# Patient Record
Sex: Male | Born: 1945
Health system: Southern US, Community
[De-identification: ages and names within clinical notes are randomized; demographics above are authoritative.]

## PROBLEM LIST (undated history)

## (undated) DIAGNOSIS — G2581 Restless legs syndrome: Secondary | ICD-10-CM

## (undated) DIAGNOSIS — E039 Hypothyroidism, unspecified: Secondary | ICD-10-CM

## (undated) DIAGNOSIS — D473 Essential (hemorrhagic) thrombocythemia: Secondary | ICD-10-CM

## (undated) DIAGNOSIS — I8290 Acute embolism and thrombosis of unspecified vein: Secondary | ICD-10-CM

## (undated) DIAGNOSIS — I4891 Unspecified atrial fibrillation: Secondary | ICD-10-CM

## (undated) DIAGNOSIS — E785 Hyperlipidemia, unspecified: Secondary | ICD-10-CM

## (undated) DIAGNOSIS — N529 Male erectile dysfunction, unspecified: Secondary | ICD-10-CM

## (undated) DIAGNOSIS — D751 Secondary polycythemia: Secondary | ICD-10-CM

## (undated) DIAGNOSIS — I499 Cardiac arrhythmia, unspecified: Secondary | ICD-10-CM

## (undated) DIAGNOSIS — G6289 Other specified polyneuropathies: Secondary | ICD-10-CM

## (undated) DIAGNOSIS — N400 Enlarged prostate without lower urinary tract symptoms: Secondary | ICD-10-CM

## (undated) DIAGNOSIS — Z950 Presence of cardiac pacemaker: Secondary | ICD-10-CM

## (undated) HISTORY — PX: TONSILLECTOMY: SUR1361

## (undated) HISTORY — DX: Secondary polycythemia: D75.1

## (undated) HISTORY — DX: Benign prostatic hyperplasia without lower urinary tract symptoms: N40.0

## (undated) HISTORY — DX: Other specified polyneuropathies: G62.89

## (undated) HISTORY — DX: Essential (hemorrhagic) thrombocythemia: D47.3

## (undated) HISTORY — PX: UPPER GASTROINTESTINAL ENDOSCOPY: SHX188

## (undated) HISTORY — DX: Restless legs syndrome: G25.81

## (undated) HISTORY — DX: Acute embolism and thrombosis of unspecified vein: I82.90

## (undated) HISTORY — DX: Male erectile dysfunction, unspecified: N52.9

## (undated) HISTORY — PX: OTHER SURGICAL HISTORY: SHX169

## (undated) HISTORY — DX: Hyperlipidemia, unspecified: E78.5

## (undated) HISTORY — DX: Hypothyroidism, unspecified: E03.9

---

## 1996-07-24 HISTORY — PX: INGUINAL HERNIA REPAIR: SUR1180

## 2003-12-22 ENCOUNTER — Ambulatory Visit (HOSPITAL_COMMUNITY): Admission: RE | Admit: 2003-12-22 | Discharge: 2003-12-22 | Payer: Self-pay | Admitting: Internal Medicine

## 2007-07-25 HISTORY — PX: COLONOSCOPY: SHX174

## 2011-09-04 DIAGNOSIS — E782 Mixed hyperlipidemia: Secondary | ICD-10-CM | POA: Diagnosis not present

## 2011-09-04 DIAGNOSIS — D649 Anemia, unspecified: Secondary | ICD-10-CM | POA: Diagnosis not present

## 2011-09-04 DIAGNOSIS — N400 Enlarged prostate without lower urinary tract symptoms: Secondary | ICD-10-CM | POA: Diagnosis not present

## 2011-09-11 DIAGNOSIS — L570 Actinic keratosis: Secondary | ICD-10-CM | POA: Diagnosis not present

## 2011-09-11 DIAGNOSIS — Z86010 Personal history of colonic polyps: Secondary | ICD-10-CM | POA: Diagnosis not present

## 2011-09-11 DIAGNOSIS — N529 Male erectile dysfunction, unspecified: Secondary | ICD-10-CM | POA: Diagnosis not present

## 2011-09-11 DIAGNOSIS — N400 Enlarged prostate without lower urinary tract symptoms: Secondary | ICD-10-CM | POA: Diagnosis not present

## 2011-09-11 DIAGNOSIS — E782 Mixed hyperlipidemia: Secondary | ICD-10-CM | POA: Diagnosis not present

## 2011-09-11 DIAGNOSIS — M1990 Unspecified osteoarthritis, unspecified site: Secondary | ICD-10-CM | POA: Diagnosis not present

## 2012-03-07 DIAGNOSIS — D649 Anemia, unspecified: Secondary | ICD-10-CM | POA: Diagnosis not present

## 2012-03-07 DIAGNOSIS — E782 Mixed hyperlipidemia: Secondary | ICD-10-CM | POA: Diagnosis not present

## 2012-03-14 DIAGNOSIS — L570 Actinic keratosis: Secondary | ICD-10-CM | POA: Diagnosis not present

## 2012-03-14 DIAGNOSIS — G619 Inflammatory polyneuropathy, unspecified: Secondary | ICD-10-CM | POA: Diagnosis not present

## 2012-03-14 DIAGNOSIS — Z86010 Personal history of colonic polyps: Secondary | ICD-10-CM | POA: Diagnosis not present

## 2012-03-14 DIAGNOSIS — M1990 Unspecified osteoarthritis, unspecified site: Secondary | ICD-10-CM | POA: Diagnosis not present

## 2012-03-14 DIAGNOSIS — E782 Mixed hyperlipidemia: Secondary | ICD-10-CM | POA: Diagnosis not present

## 2012-03-14 DIAGNOSIS — G622 Polyneuropathy due to other toxic agents: Secondary | ICD-10-CM | POA: Diagnosis not present

## 2012-03-14 DIAGNOSIS — N529 Male erectile dysfunction, unspecified: Secondary | ICD-10-CM | POA: Diagnosis not present

## 2012-03-14 DIAGNOSIS — N400 Enlarged prostate without lower urinary tract symptoms: Secondary | ICD-10-CM | POA: Diagnosis not present

## 2012-03-14 DIAGNOSIS — D649 Anemia, unspecified: Secondary | ICD-10-CM | POA: Diagnosis not present

## 2012-03-14 DIAGNOSIS — E538 Deficiency of other specified B group vitamins: Secondary | ICD-10-CM | POA: Diagnosis not present

## 2012-04-03 DIAGNOSIS — M109 Gout, unspecified: Secondary | ICD-10-CM | POA: Diagnosis not present

## 2012-04-03 DIAGNOSIS — M79609 Pain in unspecified limb: Secondary | ICD-10-CM | POA: Diagnosis not present

## 2012-04-15 DIAGNOSIS — I824Y9 Acute embolism and thrombosis of unspecified deep veins of unspecified proximal lower extremity: Secondary | ICD-10-CM | POA: Diagnosis not present

## 2012-04-15 DIAGNOSIS — I8000 Phlebitis and thrombophlebitis of superficial vessels of unspecified lower extremity: Secondary | ICD-10-CM | POA: Diagnosis not present

## 2012-04-15 DIAGNOSIS — I82819 Embolism and thrombosis of superficial veins of unspecified lower extremities: Secondary | ICD-10-CM | POA: Diagnosis not present

## 2012-04-15 DIAGNOSIS — Z23 Encounter for immunization: Secondary | ICD-10-CM | POA: Diagnosis not present

## 2012-04-15 DIAGNOSIS — M79609 Pain in unspecified limb: Secondary | ICD-10-CM | POA: Diagnosis not present

## 2012-04-15 DIAGNOSIS — M109 Gout, unspecified: Secondary | ICD-10-CM | POA: Diagnosis not present

## 2012-04-15 DIAGNOSIS — I82409 Acute embolism and thrombosis of unspecified deep veins of unspecified lower extremity: Secondary | ICD-10-CM | POA: Diagnosis not present

## 2012-04-16 DIAGNOSIS — I82409 Acute embolism and thrombosis of unspecified deep veins of unspecified lower extremity: Secondary | ICD-10-CM | POA: Diagnosis not present

## 2012-05-28 DIAGNOSIS — G619 Inflammatory polyneuropathy, unspecified: Secondary | ICD-10-CM | POA: Diagnosis not present

## 2012-05-28 DIAGNOSIS — M1990 Unspecified osteoarthritis, unspecified site: Secondary | ICD-10-CM | POA: Diagnosis not present

## 2012-05-28 DIAGNOSIS — N400 Enlarged prostate without lower urinary tract symptoms: Secondary | ICD-10-CM | POA: Diagnosis not present

## 2012-05-28 DIAGNOSIS — N410 Acute prostatitis: Secondary | ICD-10-CM | POA: Diagnosis not present

## 2012-05-28 DIAGNOSIS — E782 Mixed hyperlipidemia: Secondary | ICD-10-CM | POA: Diagnosis not present

## 2012-09-23 DIAGNOSIS — D649 Anemia, unspecified: Secondary | ICD-10-CM | POA: Diagnosis not present

## 2012-09-23 DIAGNOSIS — N400 Enlarged prostate without lower urinary tract symptoms: Secondary | ICD-10-CM | POA: Diagnosis not present

## 2012-09-26 DIAGNOSIS — I739 Peripheral vascular disease, unspecified: Secondary | ICD-10-CM | POA: Diagnosis not present

## 2012-09-26 DIAGNOSIS — M1990 Unspecified osteoarthritis, unspecified site: Secondary | ICD-10-CM | POA: Diagnosis not present

## 2012-09-26 DIAGNOSIS — Z86010 Personal history of colonic polyps: Secondary | ICD-10-CM | POA: Diagnosis not present

## 2012-10-04 DIAGNOSIS — M79609 Pain in unspecified limb: Secondary | ICD-10-CM | POA: Diagnosis not present

## 2012-10-16 DIAGNOSIS — H2510 Age-related nuclear cataract, unspecified eye: Secondary | ICD-10-CM | POA: Diagnosis not present

## 2013-01-21 DIAGNOSIS — E782 Mixed hyperlipidemia: Secondary | ICD-10-CM | POA: Diagnosis not present

## 2013-01-21 DIAGNOSIS — I739 Peripheral vascular disease, unspecified: Secondary | ICD-10-CM | POA: Diagnosis not present

## 2013-01-21 DIAGNOSIS — R5381 Other malaise: Secondary | ICD-10-CM | POA: Diagnosis not present

## 2013-01-28 DIAGNOSIS — G2581 Restless legs syndrome: Secondary | ICD-10-CM | POA: Diagnosis not present

## 2013-01-28 DIAGNOSIS — L570 Actinic keratosis: Secondary | ICD-10-CM | POA: Diagnosis not present

## 2013-06-09 DIAGNOSIS — Z23 Encounter for immunization: Secondary | ICD-10-CM | POA: Diagnosis not present

## 2013-09-02 DIAGNOSIS — K649 Unspecified hemorrhoids: Secondary | ICD-10-CM | POA: Diagnosis not present

## 2013-10-27 DIAGNOSIS — L723 Sebaceous cyst: Secondary | ICD-10-CM | POA: Diagnosis not present

## 2013-10-30 DIAGNOSIS — D485 Neoplasm of uncertain behavior of skin: Secondary | ICD-10-CM | POA: Diagnosis not present

## 2013-10-30 DIAGNOSIS — L723 Sebaceous cyst: Secondary | ICD-10-CM | POA: Diagnosis not present

## 2013-12-18 DIAGNOSIS — Z86010 Personal history of colonic polyps: Secondary | ICD-10-CM | POA: Diagnosis not present

## 2013-12-25 DIAGNOSIS — L570 Actinic keratosis: Secondary | ICD-10-CM | POA: Diagnosis not present

## 2013-12-25 DIAGNOSIS — D751 Secondary polycythemia: Secondary | ICD-10-CM | POA: Diagnosis not present

## 2013-12-25 DIAGNOSIS — Z0000 Encounter for general adult medical examination without abnormal findings: Secondary | ICD-10-CM | POA: Diagnosis not present

## 2013-12-25 DIAGNOSIS — G2581 Restless legs syndrome: Secondary | ICD-10-CM | POA: Diagnosis not present

## 2014-01-19 DIAGNOSIS — M2010 Hallux valgus (acquired), unspecified foot: Secondary | ICD-10-CM | POA: Diagnosis not present

## 2014-01-19 DIAGNOSIS — M779 Enthesopathy, unspecified: Secondary | ICD-10-CM | POA: Diagnosis not present

## 2014-01-19 DIAGNOSIS — M79609 Pain in unspecified limb: Secondary | ICD-10-CM | POA: Diagnosis not present

## 2014-03-04 DIAGNOSIS — M779 Enthesopathy, unspecified: Secondary | ICD-10-CM | POA: Diagnosis not present

## 2014-03-04 DIAGNOSIS — M79609 Pain in unspecified limb: Secondary | ICD-10-CM | POA: Diagnosis not present

## 2014-05-21 DIAGNOSIS — Z23 Encounter for immunization: Secondary | ICD-10-CM | POA: Diagnosis not present

## 2014-06-16 DIAGNOSIS — R5382 Chronic fatigue, unspecified: Secondary | ICD-10-CM | POA: Diagnosis not present

## 2014-06-16 DIAGNOSIS — D751 Secondary polycythemia: Secondary | ICD-10-CM | POA: Diagnosis not present

## 2014-06-16 DIAGNOSIS — E782 Mixed hyperlipidemia: Secondary | ICD-10-CM | POA: Diagnosis not present

## 2014-06-16 DIAGNOSIS — M1990 Unspecified osteoarthritis, unspecified site: Secondary | ICD-10-CM | POA: Diagnosis not present

## 2014-06-23 DIAGNOSIS — E782 Mixed hyperlipidemia: Secondary | ICD-10-CM | POA: Diagnosis not present

## 2014-06-23 DIAGNOSIS — Z1389 Encounter for screening for other disorder: Secondary | ICD-10-CM | POA: Diagnosis not present

## 2014-06-23 DIAGNOSIS — I739 Peripheral vascular disease, unspecified: Secondary | ICD-10-CM | POA: Diagnosis not present

## 2014-06-23 DIAGNOSIS — G6289 Other specified polyneuropathies: Secondary | ICD-10-CM | POA: Diagnosis not present

## 2014-06-23 DIAGNOSIS — D751 Secondary polycythemia: Secondary | ICD-10-CM | POA: Diagnosis not present

## 2014-06-23 DIAGNOSIS — F5221 Male erectile disorder: Secondary | ICD-10-CM | POA: Diagnosis not present

## 2014-06-23 DIAGNOSIS — N400 Enlarged prostate without lower urinary tract symptoms: Secondary | ICD-10-CM | POA: Diagnosis not present

## 2014-06-23 DIAGNOSIS — G2581 Restless legs syndrome: Secondary | ICD-10-CM | POA: Diagnosis not present

## 2014-07-23 DIAGNOSIS — M722 Plantar fascial fibromatosis: Secondary | ICD-10-CM | POA: Diagnosis not present

## 2014-07-23 DIAGNOSIS — M79672 Pain in left foot: Secondary | ICD-10-CM | POA: Diagnosis not present

## 2014-07-24 HISTORY — PX: TRANSURETHRAL RESECTION OF PROSTATE: SHX73

## 2014-07-29 DIAGNOSIS — R072 Precordial pain: Secondary | ICD-10-CM | POA: Diagnosis not present

## 2014-07-29 DIAGNOSIS — N400 Enlarged prostate without lower urinary tract symptoms: Secondary | ICD-10-CM | POA: Diagnosis not present

## 2014-08-03 DIAGNOSIS — R079 Chest pain, unspecified: Secondary | ICD-10-CM | POA: Diagnosis not present

## 2014-08-03 DIAGNOSIS — R9439 Abnormal result of other cardiovascular function study: Secondary | ICD-10-CM | POA: Diagnosis not present

## 2014-08-07 DIAGNOSIS — R079 Chest pain, unspecified: Secondary | ICD-10-CM | POA: Diagnosis not present

## 2014-08-07 DIAGNOSIS — R9439 Abnormal result of other cardiovascular function study: Secondary | ICD-10-CM | POA: Diagnosis not present

## 2014-08-07 DIAGNOSIS — I209 Angina pectoris, unspecified: Secondary | ICD-10-CM | POA: Diagnosis not present

## 2014-08-24 DIAGNOSIS — R351 Nocturia: Secondary | ICD-10-CM | POA: Diagnosis not present

## 2014-08-24 DIAGNOSIS — R3915 Urgency of urination: Secondary | ICD-10-CM | POA: Diagnosis not present

## 2014-08-24 DIAGNOSIS — R3911 Hesitancy of micturition: Secondary | ICD-10-CM | POA: Diagnosis not present

## 2014-09-22 DIAGNOSIS — R3911 Hesitancy of micturition: Secondary | ICD-10-CM | POA: Diagnosis not present

## 2014-09-22 DIAGNOSIS — R3915 Urgency of urination: Secondary | ICD-10-CM | POA: Diagnosis not present

## 2014-09-22 DIAGNOSIS — R351 Nocturia: Secondary | ICD-10-CM | POA: Diagnosis not present

## 2014-09-29 DIAGNOSIS — D45 Polycythemia vera: Secondary | ICD-10-CM | POA: Diagnosis not present

## 2014-09-29 DIAGNOSIS — Z87891 Personal history of nicotine dependence: Secondary | ICD-10-CM | POA: Diagnosis not present

## 2014-09-29 DIAGNOSIS — Z86010 Personal history of colonic polyps: Secondary | ICD-10-CM | POA: Diagnosis not present

## 2014-09-29 DIAGNOSIS — T18120A Food in esophagus causing compression of trachea, initial encounter: Secondary | ICD-10-CM | POA: Diagnosis not present

## 2014-09-29 DIAGNOSIS — R070 Pain in throat: Secondary | ICD-10-CM | POA: Diagnosis not present

## 2014-09-29 DIAGNOSIS — R9431 Abnormal electrocardiogram [ECG] [EKG]: Secondary | ICD-10-CM | POA: Diagnosis not present

## 2014-09-29 DIAGNOSIS — K222 Esophageal obstruction: Secondary | ICD-10-CM | POA: Diagnosis not present

## 2014-09-29 DIAGNOSIS — Z7982 Long term (current) use of aspirin: Secondary | ICD-10-CM | POA: Diagnosis not present

## 2014-09-29 DIAGNOSIS — N400 Enlarged prostate without lower urinary tract symptoms: Secondary | ICD-10-CM | POA: Diagnosis not present

## 2014-09-29 DIAGNOSIS — T18128A Food in esophagus causing other injury, initial encounter: Secondary | ICD-10-CM | POA: Diagnosis not present

## 2014-09-29 DIAGNOSIS — T18108A Unspecified foreign body in esophagus causing other injury, initial encounter: Secondary | ICD-10-CM | POA: Diagnosis not present

## 2014-09-29 DIAGNOSIS — K219 Gastro-esophageal reflux disease without esophagitis: Secondary | ICD-10-CM | POA: Diagnosis not present

## 2014-09-29 DIAGNOSIS — R1310 Dysphagia, unspecified: Secondary | ICD-10-CM | POA: Diagnosis not present

## 2014-09-29 DIAGNOSIS — K449 Diaphragmatic hernia without obstruction or gangrene: Secondary | ICD-10-CM | POA: Diagnosis not present

## 2014-09-29 DIAGNOSIS — Z79899 Other long term (current) drug therapy: Secondary | ICD-10-CM | POA: Diagnosis not present

## 2014-10-01 DIAGNOSIS — R3911 Hesitancy of micturition: Secondary | ICD-10-CM | POA: Diagnosis not present

## 2014-10-01 DIAGNOSIS — R351 Nocturia: Secondary | ICD-10-CM | POA: Diagnosis not present

## 2014-10-01 DIAGNOSIS — N401 Enlarged prostate with lower urinary tract symptoms: Secondary | ICD-10-CM | POA: Diagnosis not present

## 2014-10-01 DIAGNOSIS — Z86010 Personal history of colonic polyps: Secondary | ICD-10-CM | POA: Diagnosis not present

## 2014-10-01 DIAGNOSIS — Z87891 Personal history of nicotine dependence: Secondary | ICD-10-CM | POA: Diagnosis not present

## 2014-10-01 DIAGNOSIS — K219 Gastro-esophageal reflux disease without esophagitis: Secondary | ICD-10-CM | POA: Diagnosis not present

## 2014-10-01 DIAGNOSIS — Z79899 Other long term (current) drug therapy: Secondary | ICD-10-CM | POA: Diagnosis not present

## 2014-10-01 DIAGNOSIS — R3915 Urgency of urination: Secondary | ICD-10-CM | POA: Diagnosis not present

## 2014-10-02 DIAGNOSIS — N400 Enlarged prostate without lower urinary tract symptoms: Secondary | ICD-10-CM | POA: Diagnosis not present

## 2014-10-26 DIAGNOSIS — Z86010 Personal history of colonic polyps: Secondary | ICD-10-CM | POA: Diagnosis not present

## 2014-10-26 DIAGNOSIS — K222 Esophageal obstruction: Secondary | ICD-10-CM | POA: Diagnosis not present

## 2015-01-12 DIAGNOSIS — E782 Mixed hyperlipidemia: Secondary | ICD-10-CM | POA: Diagnosis not present

## 2015-01-12 DIAGNOSIS — D751 Secondary polycythemia: Secondary | ICD-10-CM | POA: Diagnosis not present

## 2015-01-12 DIAGNOSIS — R5382 Chronic fatigue, unspecified: Secondary | ICD-10-CM | POA: Diagnosis not present

## 2015-01-12 DIAGNOSIS — D473 Essential (hemorrhagic) thrombocythemia: Secondary | ICD-10-CM | POA: Diagnosis not present

## 2015-01-13 DIAGNOSIS — E782 Mixed hyperlipidemia: Secondary | ICD-10-CM | POA: Diagnosis not present

## 2015-01-14 DIAGNOSIS — M79671 Pain in right foot: Secondary | ICD-10-CM | POA: Diagnosis not present

## 2015-01-14 DIAGNOSIS — M779 Enthesopathy, unspecified: Secondary | ICD-10-CM | POA: Diagnosis not present

## 2015-01-14 DIAGNOSIS — M2042 Other hammer toe(s) (acquired), left foot: Secondary | ICD-10-CM | POA: Diagnosis not present

## 2015-01-14 DIAGNOSIS — M2011 Hallux valgus (acquired), right foot: Secondary | ICD-10-CM | POA: Diagnosis not present

## 2015-01-20 DIAGNOSIS — F5221 Male erectile disorder: Secondary | ICD-10-CM | POA: Diagnosis not present

## 2015-01-20 DIAGNOSIS — D751 Secondary polycythemia: Secondary | ICD-10-CM | POA: Diagnosis not present

## 2015-01-20 DIAGNOSIS — G2581 Restless legs syndrome: Secondary | ICD-10-CM | POA: Diagnosis not present

## 2015-01-20 DIAGNOSIS — N400 Enlarged prostate without lower urinary tract symptoms: Secondary | ICD-10-CM | POA: Diagnosis not present

## 2015-01-20 DIAGNOSIS — Z0001 Encounter for general adult medical examination with abnormal findings: Secondary | ICD-10-CM | POA: Diagnosis not present

## 2015-01-20 DIAGNOSIS — E875 Hyperkalemia: Secondary | ICD-10-CM | POA: Diagnosis not present

## 2015-01-20 DIAGNOSIS — G6289 Other specified polyneuropathies: Secondary | ICD-10-CM | POA: Diagnosis not present

## 2015-01-20 DIAGNOSIS — I739 Peripheral vascular disease, unspecified: Secondary | ICD-10-CM | POA: Diagnosis not present

## 2015-02-04 DIAGNOSIS — M79672 Pain in left foot: Secondary | ICD-10-CM | POA: Diagnosis not present

## 2015-02-04 DIAGNOSIS — M25579 Pain in unspecified ankle and joints of unspecified foot: Secondary | ICD-10-CM | POA: Diagnosis not present

## 2015-02-11 DIAGNOSIS — N3281 Overactive bladder: Secondary | ICD-10-CM | POA: Diagnosis not present

## 2015-02-11 DIAGNOSIS — R351 Nocturia: Secondary | ICD-10-CM | POA: Diagnosis not present

## 2015-02-16 DIAGNOSIS — D473 Essential (hemorrhagic) thrombocythemia: Secondary | ICD-10-CM | POA: Diagnosis not present

## 2015-02-16 DIAGNOSIS — E875 Hyperkalemia: Secondary | ICD-10-CM | POA: Diagnosis not present

## 2015-03-03 DIAGNOSIS — M779 Enthesopathy, unspecified: Secondary | ICD-10-CM | POA: Diagnosis not present

## 2015-03-03 DIAGNOSIS — M2011 Hallux valgus (acquired), right foot: Secondary | ICD-10-CM | POA: Diagnosis not present

## 2015-03-03 DIAGNOSIS — M2041 Other hammer toe(s) (acquired), right foot: Secondary | ICD-10-CM | POA: Diagnosis not present

## 2015-03-03 DIAGNOSIS — M79671 Pain in right foot: Secondary | ICD-10-CM | POA: Diagnosis not present

## 2015-05-06 DIAGNOSIS — M2041 Other hammer toe(s) (acquired), right foot: Secondary | ICD-10-CM | POA: Diagnosis not present

## 2015-05-06 DIAGNOSIS — M79674 Pain in right toe(s): Secondary | ICD-10-CM | POA: Diagnosis not present

## 2015-05-06 DIAGNOSIS — M79671 Pain in right foot: Secondary | ICD-10-CM | POA: Diagnosis not present

## 2015-05-14 DIAGNOSIS — K219 Gastro-esophageal reflux disease without esophagitis: Secondary | ICD-10-CM | POA: Diagnosis not present

## 2015-05-14 DIAGNOSIS — M2041 Other hammer toe(s) (acquired), right foot: Secondary | ICD-10-CM | POA: Diagnosis not present

## 2015-05-14 DIAGNOSIS — M79674 Pain in right toe(s): Secondary | ICD-10-CM | POA: Diagnosis not present

## 2015-05-14 DIAGNOSIS — Z86718 Personal history of other venous thrombosis and embolism: Secondary | ICD-10-CM | POA: Diagnosis not present

## 2015-05-14 DIAGNOSIS — M7751 Other enthesopathy of right foot: Secondary | ICD-10-CM | POA: Diagnosis not present

## 2015-05-14 DIAGNOSIS — M79671 Pain in right foot: Secondary | ICD-10-CM | POA: Diagnosis not present

## 2015-05-14 DIAGNOSIS — M2011 Hallux valgus (acquired), right foot: Secondary | ICD-10-CM | POA: Diagnosis not present

## 2015-05-14 DIAGNOSIS — Z79899 Other long term (current) drug therapy: Secondary | ICD-10-CM | POA: Diagnosis not present

## 2015-06-02 DIAGNOSIS — Z23 Encounter for immunization: Secondary | ICD-10-CM | POA: Diagnosis not present

## 2015-07-08 DIAGNOSIS — L600 Ingrowing nail: Secondary | ICD-10-CM | POA: Diagnosis not present

## 2015-07-08 DIAGNOSIS — L03032 Cellulitis of left toe: Secondary | ICD-10-CM | POA: Diagnosis not present

## 2015-07-08 DIAGNOSIS — M79675 Pain in left toe(s): Secondary | ICD-10-CM | POA: Diagnosis not present

## 2015-07-08 DIAGNOSIS — M79672 Pain in left foot: Secondary | ICD-10-CM | POA: Diagnosis not present

## 2015-07-20 DIAGNOSIS — D473 Essential (hemorrhagic) thrombocythemia: Secondary | ICD-10-CM | POA: Diagnosis not present

## 2015-07-20 DIAGNOSIS — M1990 Unspecified osteoarthritis, unspecified site: Secondary | ICD-10-CM | POA: Diagnosis not present

## 2015-07-20 DIAGNOSIS — E782 Mixed hyperlipidemia: Secondary | ICD-10-CM | POA: Diagnosis not present

## 2015-07-20 DIAGNOSIS — R5382 Chronic fatigue, unspecified: Secondary | ICD-10-CM | POA: Diagnosis not present

## 2015-07-20 DIAGNOSIS — E875 Hyperkalemia: Secondary | ICD-10-CM | POA: Diagnosis not present

## 2015-07-22 DIAGNOSIS — M79672 Pain in left foot: Secondary | ICD-10-CM | POA: Diagnosis not present

## 2015-07-22 DIAGNOSIS — M79675 Pain in left toe(s): Secondary | ICD-10-CM | POA: Diagnosis not present

## 2015-07-22 DIAGNOSIS — L600 Ingrowing nail: Secondary | ICD-10-CM | POA: Diagnosis not present

## 2015-07-27 DIAGNOSIS — G2581 Restless legs syndrome: Secondary | ICD-10-CM | POA: Diagnosis not present

## 2015-07-27 DIAGNOSIS — D751 Secondary polycythemia: Secondary | ICD-10-CM | POA: Diagnosis not present

## 2015-07-27 DIAGNOSIS — E782 Mixed hyperlipidemia: Secondary | ICD-10-CM | POA: Diagnosis not present

## 2015-07-27 DIAGNOSIS — D473 Essential (hemorrhagic) thrombocythemia: Secondary | ICD-10-CM | POA: Diagnosis not present

## 2015-07-27 DIAGNOSIS — F5221 Male erectile disorder: Secondary | ICD-10-CM | POA: Diagnosis not present

## 2015-07-27 DIAGNOSIS — Z1389 Encounter for screening for other disorder: Secondary | ICD-10-CM | POA: Diagnosis not present

## 2015-07-27 DIAGNOSIS — Z9189 Other specified personal risk factors, not elsewhere classified: Secondary | ICD-10-CM | POA: Diagnosis not present

## 2015-07-27 DIAGNOSIS — N400 Enlarged prostate without lower urinary tract symptoms: Secondary | ICD-10-CM | POA: Diagnosis not present

## 2015-07-27 DIAGNOSIS — G6289 Other specified polyneuropathies: Secondary | ICD-10-CM | POA: Diagnosis not present

## 2015-08-12 DIAGNOSIS — L03032 Cellulitis of left toe: Secondary | ICD-10-CM | POA: Diagnosis not present

## 2015-08-12 DIAGNOSIS — L600 Ingrowing nail: Secondary | ICD-10-CM | POA: Diagnosis not present

## 2015-08-12 DIAGNOSIS — M79675 Pain in left toe(s): Secondary | ICD-10-CM | POA: Diagnosis not present

## 2015-09-07 DIAGNOSIS — R5382 Chronic fatigue, unspecified: Secondary | ICD-10-CM | POA: Diagnosis not present

## 2015-09-20 DIAGNOSIS — N3281 Overactive bladder: Secondary | ICD-10-CM | POA: Diagnosis not present

## 2015-09-20 DIAGNOSIS — R3915 Urgency of urination: Secondary | ICD-10-CM | POA: Diagnosis not present

## 2015-10-19 DIAGNOSIS — E039 Hypothyroidism, unspecified: Secondary | ICD-10-CM | POA: Diagnosis not present

## 2015-11-18 DIAGNOSIS — M79671 Pain in right foot: Secondary | ICD-10-CM | POA: Diagnosis not present

## 2015-11-18 DIAGNOSIS — L03031 Cellulitis of right toe: Secondary | ICD-10-CM | POA: Diagnosis not present

## 2015-11-18 DIAGNOSIS — M79674 Pain in right toe(s): Secondary | ICD-10-CM | POA: Diagnosis not present

## 2015-11-30 DIAGNOSIS — D216 Benign neoplasm of connective and other soft tissue of trunk, unspecified: Secondary | ICD-10-CM | POA: Diagnosis not present

## 2015-11-30 DIAGNOSIS — L570 Actinic keratosis: Secondary | ICD-10-CM | POA: Diagnosis not present

## 2015-11-30 DIAGNOSIS — D0439 Carcinoma in situ of skin of other parts of face: Secondary | ICD-10-CM | POA: Diagnosis not present

## 2015-11-30 DIAGNOSIS — D0462 Carcinoma in situ of skin of left upper limb, including shoulder: Secondary | ICD-10-CM | POA: Diagnosis not present

## 2015-11-30 DIAGNOSIS — D044 Carcinoma in situ of skin of scalp and neck: Secondary | ICD-10-CM | POA: Diagnosis not present

## 2015-11-30 DIAGNOSIS — D485 Neoplasm of uncertain behavior of skin: Secondary | ICD-10-CM | POA: Diagnosis not present

## 2015-12-02 DIAGNOSIS — M79674 Pain in right toe(s): Secondary | ICD-10-CM | POA: Diagnosis not present

## 2015-12-02 DIAGNOSIS — L89892 Pressure ulcer of other site, stage 2: Secondary | ICD-10-CM | POA: Diagnosis not present

## 2015-12-02 DIAGNOSIS — M79671 Pain in right foot: Secondary | ICD-10-CM | POA: Diagnosis not present

## 2015-12-16 DIAGNOSIS — L89893 Pressure ulcer of other site, stage 3: Secondary | ICD-10-CM | POA: Diagnosis not present

## 2015-12-29 DIAGNOSIS — L89893 Pressure ulcer of other site, stage 3: Secondary | ICD-10-CM | POA: Diagnosis not present

## 2015-12-30 DIAGNOSIS — C44629 Squamous cell carcinoma of skin of left upper limb, including shoulder: Secondary | ICD-10-CM | POA: Diagnosis not present

## 2015-12-30 DIAGNOSIS — C44321 Squamous cell carcinoma of skin of nose: Secondary | ICD-10-CM | POA: Diagnosis not present

## 2016-01-08 DIAGNOSIS — N400 Enlarged prostate without lower urinary tract symptoms: Secondary | ICD-10-CM | POA: Diagnosis not present

## 2016-01-08 DIAGNOSIS — R0989 Other specified symptoms and signs involving the circulatory and respiratory systems: Secondary | ICD-10-CM | POA: Diagnosis not present

## 2016-01-08 DIAGNOSIS — I509 Heart failure, unspecified: Secondary | ICD-10-CM | POA: Diagnosis not present

## 2016-01-08 DIAGNOSIS — Z7982 Long term (current) use of aspirin: Secondary | ICD-10-CM | POA: Diagnosis not present

## 2016-01-08 DIAGNOSIS — T18128A Food in esophagus causing other injury, initial encounter: Secondary | ICD-10-CM | POA: Diagnosis not present

## 2016-01-08 DIAGNOSIS — Z87891 Personal history of nicotine dependence: Secondary | ICD-10-CM | POA: Diagnosis not present

## 2016-01-08 DIAGNOSIS — J9811 Atelectasis: Secondary | ICD-10-CM | POA: Diagnosis not present

## 2016-01-08 DIAGNOSIS — D72829 Elevated white blood cell count, unspecified: Secondary | ICD-10-CM | POA: Diagnosis not present

## 2016-01-08 DIAGNOSIS — Z79899 Other long term (current) drug therapy: Secondary | ICD-10-CM | POA: Diagnosis not present

## 2016-01-08 DIAGNOSIS — K219 Gastro-esophageal reflux disease without esophagitis: Secondary | ICD-10-CM | POA: Diagnosis not present

## 2016-01-08 DIAGNOSIS — I4891 Unspecified atrial fibrillation: Secondary | ICD-10-CM | POA: Diagnosis not present

## 2016-01-09 DIAGNOSIS — R9431 Abnormal electrocardiogram [ECG] [EKG]: Secondary | ICD-10-CM | POA: Diagnosis not present

## 2016-01-09 DIAGNOSIS — Z6828 Body mass index (BMI) 28.0-28.9, adult: Secondary | ICD-10-CM | POA: Diagnosis not present

## 2016-01-09 DIAGNOSIS — K117 Disturbances of salivary secretion: Secondary | ICD-10-CM | POA: Diagnosis not present

## 2016-01-09 DIAGNOSIS — K3189 Other diseases of stomach and duodenum: Secondary | ICD-10-CM | POA: Diagnosis not present

## 2016-01-09 DIAGNOSIS — R279 Unspecified lack of coordination: Secondary | ICD-10-CM | POA: Diagnosis not present

## 2016-01-09 DIAGNOSIS — K209 Esophagitis, unspecified: Secondary | ICD-10-CM | POA: Diagnosis not present

## 2016-01-09 DIAGNOSIS — I493 Ventricular premature depolarization: Secondary | ICD-10-CM | POA: Diagnosis not present

## 2016-01-09 DIAGNOSIS — Z79899 Other long term (current) drug therapy: Secondary | ICD-10-CM | POA: Diagnosis not present

## 2016-01-09 DIAGNOSIS — Z87891 Personal history of nicotine dependence: Secondary | ICD-10-CM | POA: Diagnosis not present

## 2016-01-09 DIAGNOSIS — R1310 Dysphagia, unspecified: Secondary | ICD-10-CM | POA: Diagnosis not present

## 2016-01-09 DIAGNOSIS — K210 Gastro-esophageal reflux disease with esophagitis: Secondary | ICD-10-CM | POA: Diagnosis not present

## 2016-01-09 DIAGNOSIS — T18128A Food in esophagus causing other injury, initial encounter: Secondary | ICD-10-CM | POA: Diagnosis not present

## 2016-01-09 DIAGNOSIS — I4891 Unspecified atrial fibrillation: Secondary | ICD-10-CM | POA: Diagnosis not present

## 2016-01-09 DIAGNOSIS — K222 Esophageal obstruction: Secondary | ICD-10-CM | POA: Diagnosis not present

## 2016-01-09 DIAGNOSIS — Z743 Need for continuous supervision: Secondary | ICD-10-CM | POA: Diagnosis not present

## 2016-01-10 DIAGNOSIS — R079 Chest pain, unspecified: Secondary | ICD-10-CM | POA: Diagnosis not present

## 2016-01-10 DIAGNOSIS — K210 Gastro-esophageal reflux disease with esophagitis: Secondary | ICD-10-CM | POA: Diagnosis not present

## 2016-01-10 DIAGNOSIS — T18128A Food in esophagus causing other injury, initial encounter: Secondary | ICD-10-CM | POA: Diagnosis not present

## 2016-01-10 DIAGNOSIS — I4891 Unspecified atrial fibrillation: Secondary | ICD-10-CM | POA: Diagnosis not present

## 2016-01-10 DIAGNOSIS — K222 Esophageal obstruction: Secondary | ICD-10-CM | POA: Diagnosis not present

## 2016-01-10 DIAGNOSIS — K117 Disturbances of salivary secretion: Secondary | ICD-10-CM | POA: Diagnosis not present

## 2016-01-10 DIAGNOSIS — K3189 Other diseases of stomach and duodenum: Secondary | ICD-10-CM | POA: Diagnosis not present

## 2016-01-10 DIAGNOSIS — T18128D Food in esophagus causing other injury, subsequent encounter: Secondary | ICD-10-CM | POA: Diagnosis not present

## 2016-01-12 DIAGNOSIS — L89893 Pressure ulcer of other site, stage 3: Secondary | ICD-10-CM | POA: Diagnosis not present

## 2016-01-13 DIAGNOSIS — C4442 Squamous cell carcinoma of skin of scalp and neck: Secondary | ICD-10-CM | POA: Diagnosis not present

## 2016-02-03 DIAGNOSIS — L89892 Pressure ulcer of other site, stage 2: Secondary | ICD-10-CM | POA: Diagnosis not present

## 2016-02-09 DIAGNOSIS — I480 Paroxysmal atrial fibrillation: Secondary | ICD-10-CM | POA: Diagnosis not present

## 2016-03-28 DIAGNOSIS — E875 Hyperkalemia: Secondary | ICD-10-CM | POA: Diagnosis not present

## 2016-03-28 DIAGNOSIS — E039 Hypothyroidism, unspecified: Secondary | ICD-10-CM | POA: Diagnosis not present

## 2016-03-28 DIAGNOSIS — I480 Paroxysmal atrial fibrillation: Secondary | ICD-10-CM | POA: Diagnosis not present

## 2016-03-28 DIAGNOSIS — D473 Essential (hemorrhagic) thrombocythemia: Secondary | ICD-10-CM | POA: Diagnosis not present

## 2016-03-28 DIAGNOSIS — D751 Secondary polycythemia: Secondary | ICD-10-CM | POA: Diagnosis not present

## 2016-03-28 DIAGNOSIS — E782 Mixed hyperlipidemia: Secondary | ICD-10-CM | POA: Diagnosis not present

## 2016-04-05 DIAGNOSIS — D751 Secondary polycythemia: Secondary | ICD-10-CM | POA: Diagnosis not present

## 2016-04-05 DIAGNOSIS — Z6828 Body mass index (BMI) 28.0-28.9, adult: Secondary | ICD-10-CM | POA: Diagnosis not present

## 2016-04-05 DIAGNOSIS — Z0001 Encounter for general adult medical examination with abnormal findings: Secondary | ICD-10-CM | POA: Diagnosis not present

## 2016-04-05 DIAGNOSIS — Z1212 Encounter for screening for malignant neoplasm of rectum: Secondary | ICD-10-CM | POA: Diagnosis not present

## 2016-04-05 DIAGNOSIS — Z23 Encounter for immunization: Secondary | ICD-10-CM | POA: Diagnosis not present

## 2016-04-05 DIAGNOSIS — F5221 Male erectile disorder: Secondary | ICD-10-CM | POA: Diagnosis not present

## 2016-04-05 DIAGNOSIS — D473 Essential (hemorrhagic) thrombocythemia: Secondary | ICD-10-CM | POA: Diagnosis not present

## 2016-04-05 DIAGNOSIS — E782 Mixed hyperlipidemia: Secondary | ICD-10-CM | POA: Diagnosis not present

## 2016-05-17 DIAGNOSIS — E039 Hypothyroidism, unspecified: Secondary | ICD-10-CM | POA: Diagnosis not present

## 2016-05-17 DIAGNOSIS — D751 Secondary polycythemia: Secondary | ICD-10-CM | POA: Diagnosis not present

## 2016-05-17 DIAGNOSIS — E875 Hyperkalemia: Secondary | ICD-10-CM | POA: Diagnosis not present

## 2016-05-17 DIAGNOSIS — E782 Mixed hyperlipidemia: Secondary | ICD-10-CM | POA: Diagnosis not present

## 2016-05-17 DIAGNOSIS — D473 Essential (hemorrhagic) thrombocythemia: Secondary | ICD-10-CM | POA: Diagnosis not present

## 2016-05-31 DIAGNOSIS — L89893 Pressure ulcer of other site, stage 3: Secondary | ICD-10-CM | POA: Diagnosis not present

## 2016-05-31 DIAGNOSIS — L02611 Cutaneous abscess of right foot: Secondary | ICD-10-CM | POA: Diagnosis not present

## 2016-05-31 DIAGNOSIS — M79671 Pain in right foot: Secondary | ICD-10-CM | POA: Diagnosis not present

## 2016-06-02 DIAGNOSIS — T18128A Food in esophagus causing other injury, initial encounter: Secondary | ICD-10-CM | POA: Diagnosis not present

## 2016-06-02 DIAGNOSIS — Z87891 Personal history of nicotine dependence: Secondary | ICD-10-CM | POA: Diagnosis not present

## 2016-06-02 DIAGNOSIS — Z79899 Other long term (current) drug therapy: Secondary | ICD-10-CM | POA: Diagnosis not present

## 2016-06-02 DIAGNOSIS — K449 Diaphragmatic hernia without obstruction or gangrene: Secondary | ICD-10-CM | POA: Diagnosis not present

## 2016-06-02 DIAGNOSIS — K222 Esophageal obstruction: Secondary | ICD-10-CM | POA: Diagnosis not present

## 2016-06-02 DIAGNOSIS — X58XXXA Exposure to other specified factors, initial encounter: Secondary | ICD-10-CM | POA: Diagnosis not present

## 2016-06-02 DIAGNOSIS — K219 Gastro-esophageal reflux disease without esophagitis: Secondary | ICD-10-CM | POA: Diagnosis not present

## 2016-06-02 DIAGNOSIS — T189XXA Foreign body of alimentary tract, part unspecified, initial encounter: Secondary | ICD-10-CM | POA: Diagnosis not present

## 2016-06-02 DIAGNOSIS — Z86010 Personal history of colonic polyps: Secondary | ICD-10-CM | POA: Diagnosis not present

## 2016-06-02 DIAGNOSIS — N400 Enlarged prostate without lower urinary tract symptoms: Secondary | ICD-10-CM | POA: Diagnosis not present

## 2016-06-02 DIAGNOSIS — Z7982 Long term (current) use of aspirin: Secondary | ICD-10-CM | POA: Diagnosis not present

## 2016-06-05 DIAGNOSIS — R1310 Dysphagia, unspecified: Secondary | ICD-10-CM | POA: Diagnosis not present

## 2016-06-05 DIAGNOSIS — K222 Esophageal obstruction: Secondary | ICD-10-CM | POA: Diagnosis not present

## 2016-06-14 DIAGNOSIS — Z7982 Long term (current) use of aspirin: Secondary | ICD-10-CM | POA: Diagnosis not present

## 2016-06-14 DIAGNOSIS — R1310 Dysphagia, unspecified: Secondary | ICD-10-CM | POA: Diagnosis not present

## 2016-06-14 DIAGNOSIS — K219 Gastro-esophageal reflux disease without esophagitis: Secondary | ICD-10-CM | POA: Diagnosis not present

## 2016-06-14 DIAGNOSIS — Z86010 Personal history of colonic polyps: Secondary | ICD-10-CM | POA: Diagnosis not present

## 2016-06-14 DIAGNOSIS — Z79899 Other long term (current) drug therapy: Secondary | ICD-10-CM | POA: Diagnosis not present

## 2016-06-14 DIAGNOSIS — K222 Esophageal obstruction: Secondary | ICD-10-CM | POA: Diagnosis not present

## 2016-06-21 DIAGNOSIS — E1140 Type 2 diabetes mellitus with diabetic neuropathy, unspecified: Secondary | ICD-10-CM | POA: Diagnosis not present

## 2016-06-21 DIAGNOSIS — L89893 Pressure ulcer of other site, stage 3: Secondary | ICD-10-CM | POA: Diagnosis not present

## 2016-06-28 DIAGNOSIS — E039 Hypothyroidism, unspecified: Secondary | ICD-10-CM | POA: Diagnosis not present

## 2016-07-12 DIAGNOSIS — L89893 Pressure ulcer of other site, stage 3: Secondary | ICD-10-CM | POA: Diagnosis not present

## 2016-07-12 DIAGNOSIS — E1140 Type 2 diabetes mellitus with diabetic neuropathy, unspecified: Secondary | ICD-10-CM | POA: Diagnosis not present

## 2016-08-14 DIAGNOSIS — N3281 Overactive bladder: Secondary | ICD-10-CM | POA: Diagnosis not present

## 2016-08-23 DIAGNOSIS — E1140 Type 2 diabetes mellitus with diabetic neuropathy, unspecified: Secondary | ICD-10-CM | POA: Diagnosis not present

## 2016-08-23 DIAGNOSIS — L89893 Pressure ulcer of other site, stage 3: Secondary | ICD-10-CM | POA: Diagnosis not present

## 2016-09-07 DIAGNOSIS — D0439 Carcinoma in situ of skin of other parts of face: Secondary | ICD-10-CM | POA: Diagnosis not present

## 2016-09-07 DIAGNOSIS — L570 Actinic keratosis: Secondary | ICD-10-CM | POA: Diagnosis not present

## 2016-09-07 DIAGNOSIS — X32XXXA Exposure to sunlight, initial encounter: Secondary | ICD-10-CM | POA: Diagnosis not present

## 2016-09-07 DIAGNOSIS — D225 Melanocytic nevi of trunk: Secondary | ICD-10-CM | POA: Diagnosis not present

## 2016-10-02 DIAGNOSIS — Z9189 Other specified personal risk factors, not elsewhere classified: Secondary | ICD-10-CM | POA: Diagnosis not present

## 2016-10-02 DIAGNOSIS — E039 Hypothyroidism, unspecified: Secondary | ICD-10-CM | POA: Diagnosis not present

## 2016-10-02 DIAGNOSIS — E782 Mixed hyperlipidemia: Secondary | ICD-10-CM | POA: Diagnosis not present

## 2016-10-02 DIAGNOSIS — D473 Essential (hemorrhagic) thrombocythemia: Secondary | ICD-10-CM | POA: Diagnosis not present

## 2016-10-02 DIAGNOSIS — D751 Secondary polycythemia: Secondary | ICD-10-CM | POA: Diagnosis not present

## 2016-10-02 DIAGNOSIS — E875 Hyperkalemia: Secondary | ICD-10-CM | POA: Diagnosis not present

## 2016-10-04 DIAGNOSIS — G2581 Restless legs syndrome: Secondary | ICD-10-CM | POA: Diagnosis not present

## 2016-10-04 DIAGNOSIS — D473 Essential (hemorrhagic) thrombocythemia: Secondary | ICD-10-CM | POA: Diagnosis not present

## 2016-10-04 DIAGNOSIS — D751 Secondary polycythemia: Secondary | ICD-10-CM | POA: Diagnosis not present

## 2016-10-04 DIAGNOSIS — E782 Mixed hyperlipidemia: Secondary | ICD-10-CM | POA: Diagnosis not present

## 2016-10-05 DIAGNOSIS — X32XXXD Exposure to sunlight, subsequent encounter: Secondary | ICD-10-CM | POA: Diagnosis not present

## 2016-10-05 DIAGNOSIS — L570 Actinic keratosis: Secondary | ICD-10-CM | POA: Diagnosis not present

## 2016-10-05 DIAGNOSIS — Z85828 Personal history of other malignant neoplasm of skin: Secondary | ICD-10-CM | POA: Diagnosis not present

## 2016-10-05 DIAGNOSIS — Z08 Encounter for follow-up examination after completed treatment for malignant neoplasm: Secondary | ICD-10-CM | POA: Diagnosis not present

## 2017-02-28 ENCOUNTER — Ambulatory Visit (INDEPENDENT_AMBULATORY_CARE_PROVIDER_SITE_OTHER): Payer: Medicare Other | Admitting: Urology

## 2017-02-28 DIAGNOSIS — R351 Nocturia: Secondary | ICD-10-CM

## 2017-02-28 DIAGNOSIS — N401 Enlarged prostate with lower urinary tract symptoms: Secondary | ICD-10-CM

## 2017-04-03 DIAGNOSIS — D473 Essential (hemorrhagic) thrombocythemia: Secondary | ICD-10-CM | POA: Diagnosis not present

## 2017-04-03 DIAGNOSIS — E039 Hypothyroidism, unspecified: Secondary | ICD-10-CM | POA: Diagnosis not present

## 2017-04-03 DIAGNOSIS — N400 Enlarged prostate without lower urinary tract symptoms: Secondary | ICD-10-CM | POA: Diagnosis not present

## 2017-04-03 DIAGNOSIS — E782 Mixed hyperlipidemia: Secondary | ICD-10-CM | POA: Diagnosis not present

## 2017-04-03 DIAGNOSIS — E875 Hyperkalemia: Secondary | ICD-10-CM | POA: Diagnosis not present

## 2017-05-14 DIAGNOSIS — Z1212 Encounter for screening for malignant neoplasm of rectum: Secondary | ICD-10-CM | POA: Diagnosis not present

## 2017-05-14 DIAGNOSIS — E039 Hypothyroidism, unspecified: Secondary | ICD-10-CM | POA: Diagnosis not present

## 2017-05-14 DIAGNOSIS — D751 Secondary polycythemia: Secondary | ICD-10-CM | POA: Diagnosis not present

## 2017-05-14 DIAGNOSIS — Z9189 Other specified personal risk factors, not elsewhere classified: Secondary | ICD-10-CM | POA: Diagnosis not present

## 2017-05-14 DIAGNOSIS — G2581 Restless legs syndrome: Secondary | ICD-10-CM | POA: Diagnosis not present

## 2017-05-14 DIAGNOSIS — E782 Mixed hyperlipidemia: Secondary | ICD-10-CM | POA: Diagnosis not present

## 2017-05-14 DIAGNOSIS — D473 Essential (hemorrhagic) thrombocythemia: Secondary | ICD-10-CM | POA: Diagnosis not present

## 2017-05-14 DIAGNOSIS — Z23 Encounter for immunization: Secondary | ICD-10-CM | POA: Diagnosis not present

## 2017-05-14 DIAGNOSIS — Z0001 Encounter for general adult medical examination with abnormal findings: Secondary | ICD-10-CM | POA: Diagnosis not present

## 2017-05-28 DIAGNOSIS — E039 Hypothyroidism, unspecified: Secondary | ICD-10-CM | POA: Diagnosis not present

## 2017-05-28 DIAGNOSIS — Z87891 Personal history of nicotine dependence: Secondary | ICD-10-CM | POA: Diagnosis not present

## 2017-05-28 DIAGNOSIS — I209 Angina pectoris, unspecified: Secondary | ICD-10-CM | POA: Diagnosis not present

## 2017-05-28 DIAGNOSIS — N400 Enlarged prostate without lower urinary tract symptoms: Secondary | ICD-10-CM | POA: Diagnosis not present

## 2017-05-28 DIAGNOSIS — R9439 Abnormal result of other cardiovascular function study: Secondary | ICD-10-CM | POA: Diagnosis not present

## 2017-05-28 DIAGNOSIS — K222 Esophageal obstruction: Secondary | ICD-10-CM | POA: Diagnosis not present

## 2017-05-28 DIAGNOSIS — Z79899 Other long term (current) drug therapy: Secondary | ICD-10-CM | POA: Diagnosis not present

## 2017-05-28 DIAGNOSIS — R160 Hepatomegaly, not elsewhere classified: Secondary | ICD-10-CM | POA: Diagnosis not present

## 2017-05-28 DIAGNOSIS — C946 Myelodysplastic disease, not classified: Secondary | ICD-10-CM | POA: Diagnosis not present

## 2017-05-28 DIAGNOSIS — E782 Mixed hyperlipidemia: Secondary | ICD-10-CM | POA: Diagnosis not present

## 2017-05-28 DIAGNOSIS — K219 Gastro-esophageal reflux disease without esophagitis: Secondary | ICD-10-CM | POA: Diagnosis not present

## 2017-05-30 ENCOUNTER — Ambulatory Visit: Payer: Medicare Other | Admitting: Urology

## 2017-05-30 DIAGNOSIS — R351 Nocturia: Secondary | ICD-10-CM | POA: Diagnosis not present

## 2017-05-30 DIAGNOSIS — N401 Enlarged prostate with lower urinary tract symptoms: Secondary | ICD-10-CM

## 2017-06-25 DIAGNOSIS — L570 Actinic keratosis: Secondary | ICD-10-CM | POA: Diagnosis not present

## 2017-07-04 DIAGNOSIS — N400 Enlarged prostate without lower urinary tract symptoms: Secondary | ICD-10-CM | POA: Diagnosis not present

## 2017-07-04 DIAGNOSIS — Z87891 Personal history of nicotine dependence: Secondary | ICD-10-CM | POA: Diagnosis not present

## 2017-07-04 DIAGNOSIS — Z7982 Long term (current) use of aspirin: Secondary | ICD-10-CM | POA: Diagnosis not present

## 2017-07-04 DIAGNOSIS — E039 Hypothyroidism, unspecified: Secondary | ICD-10-CM | POA: Diagnosis not present

## 2017-07-04 DIAGNOSIS — K219 Gastro-esophageal reflux disease without esophagitis: Secondary | ICD-10-CM | POA: Diagnosis not present

## 2017-07-04 DIAGNOSIS — Z79899 Other long term (current) drug therapy: Secondary | ICD-10-CM | POA: Diagnosis not present

## 2017-07-04 DIAGNOSIS — R160 Hepatomegaly, not elsewhere classified: Secondary | ICD-10-CM | POA: Diagnosis not present

## 2017-07-04 DIAGNOSIS — E782 Mixed hyperlipidemia: Secondary | ICD-10-CM | POA: Diagnosis not present

## 2017-07-11 ENCOUNTER — Ambulatory Visit: Payer: Medicare Other | Admitting: Urology

## 2017-07-11 DIAGNOSIS — N401 Enlarged prostate with lower urinary tract symptoms: Secondary | ICD-10-CM | POA: Diagnosis not present

## 2017-07-11 DIAGNOSIS — R351 Nocturia: Secondary | ICD-10-CM

## 2017-08-07 DIAGNOSIS — D45 Polycythemia vera: Secondary | ICD-10-CM | POA: Diagnosis not present

## 2017-08-27 DIAGNOSIS — L03031 Cellulitis of right toe: Secondary | ICD-10-CM | POA: Diagnosis not present

## 2017-08-27 DIAGNOSIS — M2031 Hallux varus (acquired), right foot: Secondary | ICD-10-CM | POA: Diagnosis not present

## 2017-09-05 DIAGNOSIS — L89893 Pressure ulcer of other site, stage 3: Secondary | ICD-10-CM | POA: Diagnosis not present

## 2017-09-05 DIAGNOSIS — E1140 Type 2 diabetes mellitus with diabetic neuropathy, unspecified: Secondary | ICD-10-CM | POA: Diagnosis not present

## 2017-09-26 DIAGNOSIS — L89893 Pressure ulcer of other site, stage 3: Secondary | ICD-10-CM | POA: Diagnosis not present

## 2017-09-26 DIAGNOSIS — E1140 Type 2 diabetes mellitus with diabetic neuropathy, unspecified: Secondary | ICD-10-CM | POA: Diagnosis not present

## 2017-10-10 DIAGNOSIS — L89893 Pressure ulcer of other site, stage 3: Secondary | ICD-10-CM | POA: Diagnosis not present

## 2017-10-10 DIAGNOSIS — E1140 Type 2 diabetes mellitus with diabetic neuropathy, unspecified: Secondary | ICD-10-CM | POA: Diagnosis not present

## 2017-10-17 ENCOUNTER — Ambulatory Visit: Payer: Medicare Other | Admitting: Urology

## 2017-10-17 DIAGNOSIS — R351 Nocturia: Secondary | ICD-10-CM

## 2017-10-17 DIAGNOSIS — N401 Enlarged prostate with lower urinary tract symptoms: Secondary | ICD-10-CM | POA: Diagnosis not present

## 2017-11-07 DIAGNOSIS — D751 Secondary polycythemia: Secondary | ICD-10-CM | POA: Diagnosis not present

## 2017-11-07 DIAGNOSIS — E875 Hyperkalemia: Secondary | ICD-10-CM | POA: Diagnosis not present

## 2017-11-07 DIAGNOSIS — Z9189 Other specified personal risk factors, not elsewhere classified: Secondary | ICD-10-CM | POA: Diagnosis not present

## 2017-11-07 DIAGNOSIS — E1140 Type 2 diabetes mellitus with diabetic neuropathy, unspecified: Secondary | ICD-10-CM | POA: Diagnosis not present

## 2017-11-07 DIAGNOSIS — D473 Essential (hemorrhagic) thrombocythemia: Secondary | ICD-10-CM | POA: Diagnosis not present

## 2017-11-07 DIAGNOSIS — L89893 Pressure ulcer of other site, stage 3: Secondary | ICD-10-CM | POA: Diagnosis not present

## 2017-11-07 DIAGNOSIS — G2581 Restless legs syndrome: Secondary | ICD-10-CM | POA: Diagnosis not present

## 2017-11-07 DIAGNOSIS — E782 Mixed hyperlipidemia: Secondary | ICD-10-CM | POA: Diagnosis not present

## 2017-11-16 DIAGNOSIS — D473 Essential (hemorrhagic) thrombocythemia: Secondary | ICD-10-CM | POA: Diagnosis not present

## 2017-11-16 DIAGNOSIS — D751 Secondary polycythemia: Secondary | ICD-10-CM | POA: Diagnosis not present

## 2017-11-16 DIAGNOSIS — E782 Mixed hyperlipidemia: Secondary | ICD-10-CM | POA: Diagnosis not present

## 2017-11-16 DIAGNOSIS — G6289 Other specified polyneuropathies: Secondary | ICD-10-CM | POA: Diagnosis not present

## 2017-12-26 DIAGNOSIS — S51012A Laceration without foreign body of left elbow, initial encounter: Secondary | ICD-10-CM | POA: Diagnosis not present

## 2017-12-26 DIAGNOSIS — W540XXA Bitten by dog, initial encounter: Secondary | ICD-10-CM | POA: Diagnosis not present

## 2017-12-26 DIAGNOSIS — Z23 Encounter for immunization: Secondary | ICD-10-CM | POA: Diagnosis not present

## 2018-01-03 DIAGNOSIS — Z4802 Encounter for removal of sutures: Secondary | ICD-10-CM | POA: Diagnosis not present

## 2018-01-03 DIAGNOSIS — W540XXA Bitten by dog, initial encounter: Secondary | ICD-10-CM | POA: Diagnosis not present

## 2018-01-03 DIAGNOSIS — S51012A Laceration without foreign body of left elbow, initial encounter: Secondary | ICD-10-CM | POA: Diagnosis not present

## 2018-01-16 DIAGNOSIS — E1140 Type 2 diabetes mellitus with diabetic neuropathy, unspecified: Secondary | ICD-10-CM | POA: Diagnosis not present

## 2018-01-16 DIAGNOSIS — L89893 Pressure ulcer of other site, stage 3: Secondary | ICD-10-CM | POA: Diagnosis not present

## 2018-02-06 DIAGNOSIS — E1140 Type 2 diabetes mellitus with diabetic neuropathy, unspecified: Secondary | ICD-10-CM | POA: Diagnosis not present

## 2018-02-06 DIAGNOSIS — L89893 Pressure ulcer of other site, stage 3: Secondary | ICD-10-CM | POA: Diagnosis not present

## 2018-02-28 DIAGNOSIS — C44229 Squamous cell carcinoma of skin of left ear and external auricular canal: Secondary | ICD-10-CM | POA: Diagnosis not present

## 2018-02-28 DIAGNOSIS — L570 Actinic keratosis: Secondary | ICD-10-CM | POA: Diagnosis not present

## 2018-02-28 DIAGNOSIS — X32XXXD Exposure to sunlight, subsequent encounter: Secondary | ICD-10-CM | POA: Diagnosis not present

## 2018-03-01 DIAGNOSIS — E1140 Type 2 diabetes mellitus with diabetic neuropathy, unspecified: Secondary | ICD-10-CM | POA: Diagnosis not present

## 2018-03-01 DIAGNOSIS — L89893 Pressure ulcer of other site, stage 3: Secondary | ICD-10-CM | POA: Diagnosis not present

## 2018-04-04 DIAGNOSIS — Z08 Encounter for follow-up examination after completed treatment for malignant neoplasm: Secondary | ICD-10-CM | POA: Diagnosis not present

## 2018-04-04 DIAGNOSIS — L110 Acquired keratosis follicularis: Secondary | ICD-10-CM | POA: Diagnosis not present

## 2018-04-04 DIAGNOSIS — Z85828 Personal history of other malignant neoplasm of skin: Secondary | ICD-10-CM | POA: Diagnosis not present

## 2018-04-04 DIAGNOSIS — E1151 Type 2 diabetes mellitus with diabetic peripheral angiopathy without gangrene: Secondary | ICD-10-CM | POA: Diagnosis not present

## 2018-04-04 DIAGNOSIS — E1140 Type 2 diabetes mellitus with diabetic neuropathy, unspecified: Secondary | ICD-10-CM | POA: Diagnosis not present

## 2018-05-14 DIAGNOSIS — E782 Mixed hyperlipidemia: Secondary | ICD-10-CM | POA: Diagnosis not present

## 2018-05-14 DIAGNOSIS — I480 Paroxysmal atrial fibrillation: Secondary | ICD-10-CM | POA: Diagnosis not present

## 2018-05-14 DIAGNOSIS — G2581 Restless legs syndrome: Secondary | ICD-10-CM | POA: Diagnosis not present

## 2018-05-14 DIAGNOSIS — D751 Secondary polycythemia: Secondary | ICD-10-CM | POA: Diagnosis not present

## 2018-05-14 DIAGNOSIS — E039 Hypothyroidism, unspecified: Secondary | ICD-10-CM | POA: Diagnosis not present

## 2018-05-16 DIAGNOSIS — Z23 Encounter for immunization: Secondary | ICD-10-CM | POA: Diagnosis not present

## 2018-05-16 DIAGNOSIS — Z1212 Encounter for screening for malignant neoplasm of rectum: Secondary | ICD-10-CM | POA: Diagnosis not present

## 2018-05-16 DIAGNOSIS — Z0001 Encounter for general adult medical examination with abnormal findings: Secondary | ICD-10-CM | POA: Diagnosis not present

## 2018-06-03 DIAGNOSIS — D0461 Carcinoma in situ of skin of right upper limb, including shoulder: Secondary | ICD-10-CM | POA: Diagnosis not present

## 2018-06-03 DIAGNOSIS — L570 Actinic keratosis: Secondary | ICD-10-CM | POA: Diagnosis not present

## 2018-06-03 DIAGNOSIS — X32XXXD Exposure to sunlight, subsequent encounter: Secondary | ICD-10-CM | POA: Diagnosis not present

## 2018-06-03 DIAGNOSIS — Z85828 Personal history of other malignant neoplasm of skin: Secondary | ICD-10-CM | POA: Diagnosis not present

## 2018-06-03 DIAGNOSIS — Z08 Encounter for follow-up examination after completed treatment for malignant neoplasm: Secondary | ICD-10-CM | POA: Diagnosis not present

## 2018-06-13 DIAGNOSIS — E1140 Type 2 diabetes mellitus with diabetic neuropathy, unspecified: Secondary | ICD-10-CM | POA: Diagnosis not present

## 2018-06-13 DIAGNOSIS — E1151 Type 2 diabetes mellitus with diabetic peripheral angiopathy without gangrene: Secondary | ICD-10-CM | POA: Diagnosis not present

## 2018-06-13 DIAGNOSIS — L110 Acquired keratosis follicularis: Secondary | ICD-10-CM | POA: Diagnosis not present

## 2018-06-19 ENCOUNTER — Other Ambulatory Visit (HOSPITAL_COMMUNITY): Payer: Self-pay | Admitting: Emergency Medicine

## 2018-06-19 DIAGNOSIS — Z87891 Personal history of nicotine dependence: Secondary | ICD-10-CM | POA: Diagnosis not present

## 2018-06-19 DIAGNOSIS — K219 Gastro-esophageal reflux disease without esophagitis: Secondary | ICD-10-CM | POA: Diagnosis not present

## 2018-06-19 DIAGNOSIS — Z7982 Long term (current) use of aspirin: Secondary | ICD-10-CM | POA: Diagnosis not present

## 2018-06-19 DIAGNOSIS — M79662 Pain in left lower leg: Secondary | ICD-10-CM

## 2018-06-19 DIAGNOSIS — L03116 Cellulitis of left lower limb: Secondary | ICD-10-CM | POA: Diagnosis not present

## 2018-06-19 DIAGNOSIS — Z79899 Other long term (current) drug therapy: Secondary | ICD-10-CM | POA: Diagnosis not present

## 2018-06-19 DIAGNOSIS — M7989 Other specified soft tissue disorders: Principal | ICD-10-CM

## 2018-06-25 ENCOUNTER — Ambulatory Visit (HOSPITAL_COMMUNITY)
Admission: RE | Admit: 2018-06-25 | Discharge: 2018-06-25 | Disposition: A | Discharge status: Home / Self Care | Payer: Medicare Other | Source: Ambulatory Visit | Attending: Emergency Medicine | Admitting: Emergency Medicine

## 2018-06-25 DIAGNOSIS — M7989 Other specified soft tissue disorders: Secondary | ICD-10-CM | POA: Diagnosis not present

## 2018-06-25 DIAGNOSIS — M79662 Pain in left lower leg: Secondary | ICD-10-CM

## 2018-06-25 DIAGNOSIS — I8002 Phlebitis and thrombophlebitis of superficial vessels of left lower extremity: Secondary | ICD-10-CM | POA: Insufficient documentation

## 2018-07-01 DIAGNOSIS — I8003 Phlebitis and thrombophlebitis of superficial vessels of lower extremities, bilateral: Secondary | ICD-10-CM | POA: Diagnosis not present

## 2018-07-03 DIAGNOSIS — L570 Actinic keratosis: Secondary | ICD-10-CM | POA: Diagnosis not present

## 2018-08-21 DIAGNOSIS — E1151 Type 2 diabetes mellitus with diabetic peripheral angiopathy without gangrene: Secondary | ICD-10-CM | POA: Diagnosis not present

## 2018-08-21 DIAGNOSIS — M79671 Pain in right foot: Secondary | ICD-10-CM | POA: Diagnosis not present

## 2018-08-21 DIAGNOSIS — L110 Acquired keratosis follicularis: Secondary | ICD-10-CM | POA: Diagnosis not present

## 2018-08-21 DIAGNOSIS — E1140 Type 2 diabetes mellitus with diabetic neuropathy, unspecified: Secondary | ICD-10-CM | POA: Diagnosis not present

## 2018-08-22 DIAGNOSIS — I480 Paroxysmal atrial fibrillation: Secondary | ICD-10-CM | POA: Diagnosis not present

## 2018-09-20 DIAGNOSIS — I480 Paroxysmal atrial fibrillation: Secondary | ICD-10-CM | POA: Diagnosis not present

## 2018-09-20 DIAGNOSIS — E782 Mixed hyperlipidemia: Secondary | ICD-10-CM | POA: Diagnosis not present

## 2018-10-30 DIAGNOSIS — L110 Acquired keratosis follicularis: Secondary | ICD-10-CM | POA: Diagnosis not present

## 2018-10-30 DIAGNOSIS — E1151 Type 2 diabetes mellitus with diabetic peripheral angiopathy without gangrene: Secondary | ICD-10-CM | POA: Diagnosis not present

## 2018-10-30 DIAGNOSIS — M79671 Pain in right foot: Secondary | ICD-10-CM | POA: Diagnosis not present

## 2018-10-30 DIAGNOSIS — E1140 Type 2 diabetes mellitus with diabetic neuropathy, unspecified: Secondary | ICD-10-CM | POA: Diagnosis not present

## 2018-11-13 DIAGNOSIS — K219 Gastro-esophageal reflux disease without esophagitis: Secondary | ICD-10-CM | POA: Diagnosis not present

## 2018-11-13 DIAGNOSIS — E039 Hypothyroidism, unspecified: Secondary | ICD-10-CM | POA: Diagnosis not present

## 2018-11-13 DIAGNOSIS — R946 Abnormal results of thyroid function studies: Secondary | ICD-10-CM | POA: Diagnosis not present

## 2018-11-13 DIAGNOSIS — R5382 Chronic fatigue, unspecified: Secondary | ICD-10-CM | POA: Diagnosis not present

## 2018-11-13 DIAGNOSIS — E782 Mixed hyperlipidemia: Secondary | ICD-10-CM | POA: Diagnosis not present

## 2018-11-13 DIAGNOSIS — I480 Paroxysmal atrial fibrillation: Secondary | ICD-10-CM | POA: Diagnosis not present

## 2018-11-18 DIAGNOSIS — D473 Essential (hemorrhagic) thrombocythemia: Secondary | ICD-10-CM | POA: Diagnosis not present

## 2018-11-18 DIAGNOSIS — D751 Secondary polycythemia: Secondary | ICD-10-CM | POA: Diagnosis not present

## 2018-11-18 DIAGNOSIS — G2581 Restless legs syndrome: Secondary | ICD-10-CM | POA: Diagnosis not present

## 2018-11-18 DIAGNOSIS — E782 Mixed hyperlipidemia: Secondary | ICD-10-CM | POA: Diagnosis not present

## 2018-12-21 DIAGNOSIS — I10 Essential (primary) hypertension: Secondary | ICD-10-CM | POA: Diagnosis not present

## 2018-12-21 DIAGNOSIS — E782 Mixed hyperlipidemia: Secondary | ICD-10-CM | POA: Diagnosis not present

## 2019-01-15 DIAGNOSIS — E1140 Type 2 diabetes mellitus with diabetic neuropathy, unspecified: Secondary | ICD-10-CM | POA: Diagnosis not present

## 2019-01-15 DIAGNOSIS — M79671 Pain in right foot: Secondary | ICD-10-CM | POA: Diagnosis not present

## 2019-01-15 DIAGNOSIS — E1151 Type 2 diabetes mellitus with diabetic peripheral angiopathy without gangrene: Secondary | ICD-10-CM | POA: Diagnosis not present

## 2019-01-15 DIAGNOSIS — L110 Acquired keratosis follicularis: Secondary | ICD-10-CM | POA: Diagnosis not present

## 2019-01-21 DIAGNOSIS — E782 Mixed hyperlipidemia: Secondary | ICD-10-CM | POA: Diagnosis not present

## 2019-01-21 DIAGNOSIS — E039 Hypothyroidism, unspecified: Secondary | ICD-10-CM | POA: Diagnosis not present

## 2019-02-21 DIAGNOSIS — I10 Essential (primary) hypertension: Secondary | ICD-10-CM | POA: Diagnosis not present

## 2019-03-24 DIAGNOSIS — D751 Secondary polycythemia: Secondary | ICD-10-CM | POA: Diagnosis not present

## 2019-03-24 DIAGNOSIS — I10 Essential (primary) hypertension: Secondary | ICD-10-CM | POA: Diagnosis not present

## 2019-03-24 DIAGNOSIS — I480 Paroxysmal atrial fibrillation: Secondary | ICD-10-CM | POA: Diagnosis not present

## 2019-03-24 DIAGNOSIS — E039 Hypothyroidism, unspecified: Secondary | ICD-10-CM | POA: Diagnosis not present

## 2019-03-27 DIAGNOSIS — I480 Paroxysmal atrial fibrillation: Secondary | ICD-10-CM | POA: Diagnosis not present

## 2019-04-02 DIAGNOSIS — I482 Chronic atrial fibrillation, unspecified: Secondary | ICD-10-CM | POA: Diagnosis not present

## 2019-04-07 DIAGNOSIS — L570 Actinic keratosis: Secondary | ICD-10-CM | POA: Diagnosis not present

## 2019-04-07 DIAGNOSIS — D225 Melanocytic nevi of trunk: Secondary | ICD-10-CM | POA: Diagnosis not present

## 2019-04-07 DIAGNOSIS — Z08 Encounter for follow-up examination after completed treatment for malignant neoplasm: Secondary | ICD-10-CM | POA: Diagnosis not present

## 2019-04-07 DIAGNOSIS — I482 Chronic atrial fibrillation, unspecified: Secondary | ICD-10-CM | POA: Diagnosis not present

## 2019-04-07 DIAGNOSIS — X32XXXD Exposure to sunlight, subsequent encounter: Secondary | ICD-10-CM | POA: Diagnosis not present

## 2019-04-07 DIAGNOSIS — C4441 Basal cell carcinoma of skin of scalp and neck: Secondary | ICD-10-CM | POA: Diagnosis not present

## 2019-04-07 DIAGNOSIS — Z85828 Personal history of other malignant neoplasm of skin: Secondary | ICD-10-CM | POA: Diagnosis not present

## 2019-04-09 DIAGNOSIS — L110 Acquired keratosis follicularis: Secondary | ICD-10-CM | POA: Diagnosis not present

## 2019-04-09 DIAGNOSIS — M79671 Pain in right foot: Secondary | ICD-10-CM | POA: Diagnosis not present

## 2019-04-09 DIAGNOSIS — E1140 Type 2 diabetes mellitus with diabetic neuropathy, unspecified: Secondary | ICD-10-CM | POA: Diagnosis not present

## 2019-04-09 DIAGNOSIS — E1151 Type 2 diabetes mellitus with diabetic peripheral angiopathy without gangrene: Secondary | ICD-10-CM | POA: Diagnosis not present

## 2019-04-15 DIAGNOSIS — I482 Chronic atrial fibrillation, unspecified: Secondary | ICD-10-CM | POA: Diagnosis not present

## 2019-04-18 DIAGNOSIS — I482 Chronic atrial fibrillation, unspecified: Secondary | ICD-10-CM | POA: Diagnosis not present

## 2019-04-23 DIAGNOSIS — I482 Chronic atrial fibrillation, unspecified: Secondary | ICD-10-CM | POA: Diagnosis not present

## 2019-05-08 DIAGNOSIS — C4441 Basal cell carcinoma of skin of scalp and neck: Secondary | ICD-10-CM | POA: Diagnosis not present

## 2019-05-08 DIAGNOSIS — I482 Chronic atrial fibrillation, unspecified: Secondary | ICD-10-CM | POA: Diagnosis not present

## 2019-05-15 DIAGNOSIS — E039 Hypothyroidism, unspecified: Secondary | ICD-10-CM | POA: Diagnosis not present

## 2019-05-15 DIAGNOSIS — R5382 Chronic fatigue, unspecified: Secondary | ICD-10-CM | POA: Diagnosis not present

## 2019-05-15 DIAGNOSIS — E875 Hyperkalemia: Secondary | ICD-10-CM | POA: Diagnosis not present

## 2019-05-15 DIAGNOSIS — D473 Essential (hemorrhagic) thrombocythemia: Secondary | ICD-10-CM | POA: Diagnosis not present

## 2019-05-15 DIAGNOSIS — K219 Gastro-esophageal reflux disease without esophagitis: Secondary | ICD-10-CM | POA: Diagnosis not present

## 2019-05-19 DIAGNOSIS — D473 Essential (hemorrhagic) thrombocythemia: Secondary | ICD-10-CM | POA: Diagnosis not present

## 2019-05-19 DIAGNOSIS — Z1212 Encounter for screening for malignant neoplasm of rectum: Secondary | ICD-10-CM | POA: Diagnosis not present

## 2019-05-19 DIAGNOSIS — D751 Secondary polycythemia: Secondary | ICD-10-CM | POA: Diagnosis not present

## 2019-05-19 DIAGNOSIS — Z0001 Encounter for general adult medical examination with abnormal findings: Secondary | ICD-10-CM | POA: Diagnosis not present

## 2019-05-29 DIAGNOSIS — I482 Chronic atrial fibrillation, unspecified: Secondary | ICD-10-CM | POA: Diagnosis not present

## 2019-06-23 DIAGNOSIS — E782 Mixed hyperlipidemia: Secondary | ICD-10-CM | POA: Diagnosis not present

## 2019-06-23 DIAGNOSIS — D751 Secondary polycythemia: Secondary | ICD-10-CM | POA: Diagnosis not present

## 2019-06-23 DIAGNOSIS — I480 Paroxysmal atrial fibrillation: Secondary | ICD-10-CM | POA: Diagnosis not present

## 2019-07-09 DIAGNOSIS — E1151 Type 2 diabetes mellitus with diabetic peripheral angiopathy without gangrene: Secondary | ICD-10-CM | POA: Diagnosis not present

## 2019-07-09 DIAGNOSIS — E1140 Type 2 diabetes mellitus with diabetic neuropathy, unspecified: Secondary | ICD-10-CM | POA: Diagnosis not present

## 2019-07-09 DIAGNOSIS — L110 Acquired keratosis follicularis: Secondary | ICD-10-CM | POA: Diagnosis not present

## 2019-07-09 DIAGNOSIS — M79671 Pain in right foot: Secondary | ICD-10-CM | POA: Diagnosis not present

## 2019-07-14 DIAGNOSIS — I482 Chronic atrial fibrillation, unspecified: Secondary | ICD-10-CM | POA: Diagnosis not present

## 2019-08-11 DIAGNOSIS — I482 Chronic atrial fibrillation, unspecified: Secondary | ICD-10-CM | POA: Diagnosis not present

## 2019-08-22 DIAGNOSIS — I480 Paroxysmal atrial fibrillation: Secondary | ICD-10-CM | POA: Diagnosis not present

## 2019-08-22 DIAGNOSIS — E7849 Other hyperlipidemia: Secondary | ICD-10-CM | POA: Diagnosis not present

## 2019-09-08 DIAGNOSIS — I4891 Unspecified atrial fibrillation: Secondary | ICD-10-CM | POA: Diagnosis not present

## 2019-09-19 DIAGNOSIS — I10 Essential (primary) hypertension: Secondary | ICD-10-CM | POA: Diagnosis not present

## 2019-09-19 DIAGNOSIS — E7849 Other hyperlipidemia: Secondary | ICD-10-CM | POA: Diagnosis not present

## 2019-09-24 DIAGNOSIS — L110 Acquired keratosis follicularis: Secondary | ICD-10-CM | POA: Diagnosis not present

## 2019-09-24 DIAGNOSIS — E1140 Type 2 diabetes mellitus with diabetic neuropathy, unspecified: Secondary | ICD-10-CM | POA: Diagnosis not present

## 2019-09-24 DIAGNOSIS — M79671 Pain in right foot: Secondary | ICD-10-CM | POA: Diagnosis not present

## 2019-09-24 DIAGNOSIS — E1151 Type 2 diabetes mellitus with diabetic peripheral angiopathy without gangrene: Secondary | ICD-10-CM | POA: Diagnosis not present

## 2019-10-13 DIAGNOSIS — I482 Chronic atrial fibrillation, unspecified: Secondary | ICD-10-CM | POA: Diagnosis not present

## 2019-10-22 DIAGNOSIS — I10 Essential (primary) hypertension: Secondary | ICD-10-CM | POA: Diagnosis not present

## 2019-10-22 DIAGNOSIS — E7849 Other hyperlipidemia: Secondary | ICD-10-CM | POA: Diagnosis not present

## 2019-11-10 DIAGNOSIS — Z08 Encounter for follow-up examination after completed treatment for malignant neoplasm: Secondary | ICD-10-CM | POA: Diagnosis not present

## 2019-11-10 DIAGNOSIS — Z85828 Personal history of other malignant neoplasm of skin: Secondary | ICD-10-CM | POA: Diagnosis not present

## 2019-11-10 DIAGNOSIS — L570 Actinic keratosis: Secondary | ICD-10-CM | POA: Diagnosis not present

## 2019-11-10 DIAGNOSIS — X32XXXD Exposure to sunlight, subsequent encounter: Secondary | ICD-10-CM | POA: Diagnosis not present

## 2019-11-17 DIAGNOSIS — D473 Essential (hemorrhagic) thrombocythemia: Secondary | ICD-10-CM | POA: Diagnosis not present

## 2019-11-17 DIAGNOSIS — E039 Hypothyroidism, unspecified: Secondary | ICD-10-CM | POA: Diagnosis not present

## 2019-11-17 DIAGNOSIS — I482 Chronic atrial fibrillation, unspecified: Secondary | ICD-10-CM | POA: Diagnosis not present

## 2019-11-17 DIAGNOSIS — E875 Hyperkalemia: Secondary | ICD-10-CM | POA: Diagnosis not present

## 2019-11-17 DIAGNOSIS — K219 Gastro-esophageal reflux disease without esophagitis: Secondary | ICD-10-CM | POA: Diagnosis not present

## 2019-11-20 DIAGNOSIS — D473 Essential (hemorrhagic) thrombocythemia: Secondary | ICD-10-CM | POA: Diagnosis not present

## 2019-11-20 DIAGNOSIS — D751 Secondary polycythemia: Secondary | ICD-10-CM | POA: Diagnosis not present

## 2019-11-20 DIAGNOSIS — E782 Mixed hyperlipidemia: Secondary | ICD-10-CM | POA: Diagnosis not present

## 2019-11-20 DIAGNOSIS — G2581 Restless legs syndrome: Secondary | ICD-10-CM | POA: Diagnosis not present

## 2019-11-21 DIAGNOSIS — I482 Chronic atrial fibrillation, unspecified: Secondary | ICD-10-CM | POA: Diagnosis not present

## 2019-11-21 DIAGNOSIS — E7849 Other hyperlipidemia: Secondary | ICD-10-CM | POA: Diagnosis not present

## 2019-12-22 DIAGNOSIS — D473 Essential (hemorrhagic) thrombocythemia: Secondary | ICD-10-CM | POA: Diagnosis not present

## 2019-12-22 DIAGNOSIS — D751 Secondary polycythemia: Secondary | ICD-10-CM | POA: Diagnosis not present

## 2019-12-22 DIAGNOSIS — K219 Gastro-esophageal reflux disease without esophagitis: Secondary | ICD-10-CM | POA: Diagnosis not present

## 2019-12-22 DIAGNOSIS — I482 Chronic atrial fibrillation, unspecified: Secondary | ICD-10-CM | POA: Diagnosis not present

## 2019-12-23 DIAGNOSIS — I4891 Unspecified atrial fibrillation: Secondary | ICD-10-CM | POA: Diagnosis not present

## 2019-12-24 DIAGNOSIS — E1151 Type 2 diabetes mellitus with diabetic peripheral angiopathy without gangrene: Secondary | ICD-10-CM | POA: Diagnosis not present

## 2019-12-24 DIAGNOSIS — M79671 Pain in right foot: Secondary | ICD-10-CM | POA: Diagnosis not present

## 2019-12-24 DIAGNOSIS — L110 Acquired keratosis follicularis: Secondary | ICD-10-CM | POA: Diagnosis not present

## 2019-12-24 DIAGNOSIS — E1140 Type 2 diabetes mellitus with diabetic neuropathy, unspecified: Secondary | ICD-10-CM | POA: Diagnosis not present

## 2020-01-19 DIAGNOSIS — I482 Chronic atrial fibrillation, unspecified: Secondary | ICD-10-CM | POA: Diagnosis not present

## 2020-01-19 DIAGNOSIS — D473 Essential (hemorrhagic) thrombocythemia: Secondary | ICD-10-CM | POA: Diagnosis not present

## 2020-01-21 DIAGNOSIS — D751 Secondary polycythemia: Secondary | ICD-10-CM | POA: Diagnosis not present

## 2020-01-21 DIAGNOSIS — D473 Essential (hemorrhagic) thrombocythemia: Secondary | ICD-10-CM | POA: Diagnosis not present

## 2020-01-21 DIAGNOSIS — I482 Chronic atrial fibrillation, unspecified: Secondary | ICD-10-CM | POA: Diagnosis not present

## 2020-01-21 DIAGNOSIS — K219 Gastro-esophageal reflux disease without esophagitis: Secondary | ICD-10-CM | POA: Diagnosis not present

## 2020-02-20 DIAGNOSIS — D751 Secondary polycythemia: Secondary | ICD-10-CM | POA: Diagnosis not present

## 2020-02-20 DIAGNOSIS — D473 Essential (hemorrhagic) thrombocythemia: Secondary | ICD-10-CM | POA: Diagnosis not present

## 2020-02-20 DIAGNOSIS — I482 Chronic atrial fibrillation, unspecified: Secondary | ICD-10-CM | POA: Diagnosis not present

## 2020-02-20 DIAGNOSIS — K219 Gastro-esophageal reflux disease without esophagitis: Secondary | ICD-10-CM | POA: Diagnosis not present

## 2020-03-01 DIAGNOSIS — I482 Chronic atrial fibrillation, unspecified: Secondary | ICD-10-CM | POA: Diagnosis not present

## 2020-03-03 DIAGNOSIS — E1151 Type 2 diabetes mellitus with diabetic peripheral angiopathy without gangrene: Secondary | ICD-10-CM | POA: Diagnosis not present

## 2020-03-03 DIAGNOSIS — E1140 Type 2 diabetes mellitus with diabetic neuropathy, unspecified: Secondary | ICD-10-CM | POA: Diagnosis not present

## 2020-03-03 DIAGNOSIS — L110 Acquired keratosis follicularis: Secondary | ICD-10-CM | POA: Diagnosis not present

## 2020-03-03 DIAGNOSIS — M79671 Pain in right foot: Secondary | ICD-10-CM | POA: Diagnosis not present

## 2020-03-23 DIAGNOSIS — D751 Secondary polycythemia: Secondary | ICD-10-CM | POA: Diagnosis not present

## 2020-03-23 DIAGNOSIS — D473 Essential (hemorrhagic) thrombocythemia: Secondary | ICD-10-CM | POA: Diagnosis not present

## 2020-03-23 DIAGNOSIS — K219 Gastro-esophageal reflux disease without esophagitis: Secondary | ICD-10-CM | POA: Diagnosis not present

## 2020-03-23 DIAGNOSIS — I482 Chronic atrial fibrillation, unspecified: Secondary | ICD-10-CM | POA: Diagnosis not present

## 2020-04-12 DIAGNOSIS — I482 Chronic atrial fibrillation, unspecified: Secondary | ICD-10-CM | POA: Diagnosis not present

## 2020-04-22 DIAGNOSIS — D473 Essential (hemorrhagic) thrombocythemia: Secondary | ICD-10-CM | POA: Diagnosis not present

## 2020-04-22 DIAGNOSIS — K219 Gastro-esophageal reflux disease without esophagitis: Secondary | ICD-10-CM | POA: Diagnosis not present

## 2020-04-22 DIAGNOSIS — D751 Secondary polycythemia: Secondary | ICD-10-CM | POA: Diagnosis not present

## 2020-04-22 DIAGNOSIS — I482 Chronic atrial fibrillation, unspecified: Secondary | ICD-10-CM | POA: Diagnosis not present

## 2020-05-10 DIAGNOSIS — R059 Cough, unspecified: Secondary | ICD-10-CM | POA: Diagnosis not present

## 2020-05-10 DIAGNOSIS — J329 Chronic sinusitis, unspecified: Secondary | ICD-10-CM | POA: Diagnosis not present

## 2020-05-12 DIAGNOSIS — I4891 Unspecified atrial fibrillation: Secondary | ICD-10-CM | POA: Diagnosis not present

## 2020-05-12 DIAGNOSIS — E1140 Type 2 diabetes mellitus with diabetic neuropathy, unspecified: Secondary | ICD-10-CM | POA: Diagnosis not present

## 2020-05-12 DIAGNOSIS — R0602 Shortness of breath: Secondary | ICD-10-CM | POA: Diagnosis not present

## 2020-05-12 DIAGNOSIS — R079 Chest pain, unspecified: Secondary | ICD-10-CM | POA: Diagnosis not present

## 2020-05-12 DIAGNOSIS — M79671 Pain in right foot: Secondary | ICD-10-CM | POA: Diagnosis not present

## 2020-05-12 DIAGNOSIS — E1151 Type 2 diabetes mellitus with diabetic peripheral angiopathy without gangrene: Secondary | ICD-10-CM | POA: Diagnosis not present

## 2020-05-12 DIAGNOSIS — L110 Acquired keratosis follicularis: Secondary | ICD-10-CM | POA: Diagnosis not present

## 2020-05-17 DIAGNOSIS — E039 Hypothyroidism, unspecified: Secondary | ICD-10-CM | POA: Diagnosis not present

## 2020-05-17 DIAGNOSIS — K219 Gastro-esophageal reflux disease without esophagitis: Secondary | ICD-10-CM | POA: Diagnosis not present

## 2020-05-17 DIAGNOSIS — E782 Mixed hyperlipidemia: Secondary | ICD-10-CM | POA: Diagnosis not present

## 2020-05-17 DIAGNOSIS — Z1389 Encounter for screening for other disorder: Secondary | ICD-10-CM | POA: Diagnosis not present

## 2020-05-17 DIAGNOSIS — Z0001 Encounter for general adult medical examination with abnormal findings: Secondary | ICD-10-CM | POA: Diagnosis not present

## 2020-05-19 DIAGNOSIS — I482 Chronic atrial fibrillation, unspecified: Secondary | ICD-10-CM | POA: Diagnosis not present

## 2020-05-19 DIAGNOSIS — D751 Secondary polycythemia: Secondary | ICD-10-CM | POA: Diagnosis not present

## 2020-05-19 DIAGNOSIS — Z23 Encounter for immunization: Secondary | ICD-10-CM | POA: Diagnosis not present

## 2020-05-19 DIAGNOSIS — E782 Mixed hyperlipidemia: Secondary | ICD-10-CM | POA: Diagnosis not present

## 2020-05-19 DIAGNOSIS — D473 Essential (hemorrhagic) thrombocythemia: Secondary | ICD-10-CM | POA: Diagnosis not present

## 2020-05-19 DIAGNOSIS — Z0001 Encounter for general adult medical examination with abnormal findings: Secondary | ICD-10-CM | POA: Diagnosis not present

## 2020-05-19 DIAGNOSIS — G4730 Sleep apnea, unspecified: Secondary | ICD-10-CM | POA: Diagnosis not present

## 2020-05-31 DIAGNOSIS — I351 Nonrheumatic aortic (valve) insufficiency: Secondary | ICD-10-CM | POA: Diagnosis not present

## 2020-05-31 DIAGNOSIS — R079 Chest pain, unspecified: Secondary | ICD-10-CM | POA: Diagnosis not present

## 2020-05-31 DIAGNOSIS — I088 Other rheumatic multiple valve diseases: Secondary | ICD-10-CM | POA: Diagnosis not present

## 2020-05-31 DIAGNOSIS — R0602 Shortness of breath: Secondary | ICD-10-CM | POA: Diagnosis not present

## 2020-05-31 DIAGNOSIS — I517 Cardiomegaly: Secondary | ICD-10-CM | POA: Diagnosis not present

## 2020-05-31 DIAGNOSIS — I4891 Unspecified atrial fibrillation: Secondary | ICD-10-CM | POA: Diagnosis not present

## 2020-05-31 DIAGNOSIS — Z7901 Long term (current) use of anticoagulants: Secondary | ICD-10-CM | POA: Diagnosis not present

## 2020-05-31 DIAGNOSIS — I77810 Thoracic aortic ectasia: Secondary | ICD-10-CM | POA: Diagnosis not present

## 2020-06-20 DIAGNOSIS — M2012 Hallux valgus (acquired), left foot: Secondary | ICD-10-CM | POA: Diagnosis not present

## 2020-06-20 DIAGNOSIS — Z87891 Personal history of nicotine dependence: Secondary | ICD-10-CM | POA: Diagnosis not present

## 2020-06-20 DIAGNOSIS — E079 Disorder of thyroid, unspecified: Secondary | ICD-10-CM | POA: Diagnosis not present

## 2020-06-20 DIAGNOSIS — Z79899 Other long term (current) drug therapy: Secondary | ICD-10-CM | POA: Diagnosis not present

## 2020-06-20 DIAGNOSIS — D45 Polycythemia vera: Secondary | ICD-10-CM | POA: Diagnosis not present

## 2020-06-20 DIAGNOSIS — G709 Myoneural disorder, unspecified: Secondary | ICD-10-CM | POA: Diagnosis not present

## 2020-06-20 DIAGNOSIS — M7732 Calcaneal spur, left foot: Secondary | ICD-10-CM | POA: Diagnosis not present

## 2020-06-20 DIAGNOSIS — K219 Gastro-esophageal reflux disease without esophagitis: Secondary | ICD-10-CM | POA: Diagnosis not present

## 2020-06-20 DIAGNOSIS — Z7982 Long term (current) use of aspirin: Secondary | ICD-10-CM | POA: Diagnosis not present

## 2020-06-20 DIAGNOSIS — M722 Plantar fascial fibromatosis: Secondary | ICD-10-CM | POA: Diagnosis not present

## 2020-06-20 DIAGNOSIS — L97529 Non-pressure chronic ulcer of other part of left foot with unspecified severity: Secondary | ICD-10-CM | POA: Diagnosis not present

## 2020-06-22 ENCOUNTER — Encounter: Payer: Self-pay | Admitting: *Deleted

## 2020-06-22 DIAGNOSIS — K219 Gastro-esophageal reflux disease without esophagitis: Secondary | ICD-10-CM | POA: Diagnosis not present

## 2020-06-22 DIAGNOSIS — I482 Chronic atrial fibrillation, unspecified: Secondary | ICD-10-CM | POA: Diagnosis not present

## 2020-06-22 DIAGNOSIS — D473 Essential (hemorrhagic) thrombocythemia: Secondary | ICD-10-CM | POA: Diagnosis not present

## 2020-06-22 DIAGNOSIS — L03116 Cellulitis of left lower limb: Secondary | ICD-10-CM | POA: Diagnosis not present

## 2020-06-22 DIAGNOSIS — L97529 Non-pressure chronic ulcer of other part of left foot with unspecified severity: Secondary | ICD-10-CM | POA: Diagnosis not present

## 2020-06-22 NOTE — Progress Notes (Signed)
Cardiology Office Note   Date:  06/23/2020   ID:  Alexander Maynard, DOB Aug 06, 1945, MRN [id number]  PCP:  Caryl Bis, MD  Cardiologist:   Minus Breeding, MD Referring:  Caryl Bis, MD  Chief Complaint  Patient presents with  . Shortness of Breath      History of Present Illness: Alexander Maynard is a 74 y.o. male who was referred by Caryl Bis, MD for evaluation of shortness of breath.  He reports that 2 or 3 weeks ago he was having shortness of breath.  He saw his primary provider.  He had significant fatigue.  He reports that after being treated with diuretics he had significant improvement in his symptoms.  Trying to sort through the office records its not clear to me exactly what was being treated.  Echo last month demonstrated a normal EF with mild to moderate AI.  The aortic root was mildly enlarged at 42 mm.  The Ef was 60%.    He says that now he feels well.  He is back to doing things such as pushing a lawnmower.  Is not having the fatigue.  He is not having any shortness of breath.  Is never describe PND or orthopnea.  He did not have any palpitations, presyncope or syncope.  He does not think he had any weight gain.  He has some chronic lower extremity edema.  He has some significant left leg edema but he said this was related to problems with his shoes.  He has never had any prior cardiac work-up other than a treadmill some years ago.   Past Medical History:  Diagnosis Date  . Enlarged prostate without lower urinary tract symptoms (luts)   . Essential thrombocythemia ([place])   . Hyperlipidemia   . Hypothyroidism   . Male erectile disorder   . Other specified polyneuropathies   . Polycythemia, secondary   . Restless leg syndrome   . Venous thrombosis    There is a mention of this in his notes.  He is on chronic anticoagulation.    Past Surgical History:  Procedure Laterality Date  . AMPUTATION OF SECOND TOE    . COLONOSCOPY  2009  . INGUINAL HERNIA  REPAIR  1998  . LARYNGEAL POLYP    . TONSILLECTOMY    . TRANSURETHRAL RESECTION OF PROSTATE  2016  . UPPER GASTROINTESTINAL ENDOSCOPY       Current Outpatient Medications  Medication Sig Dispense Refill  . furosemide (LASIX) 20 MG tablet Take 20 mg by mouth daily.    Marland Kitchen gabapentin (NEURONTIN) 300 MG capsule Take 300 mg by mouth 2 (two) times daily.    . hydroxyurea (HYDREA) 500 MG capsule Take 1,000 mg by mouth daily. May take with food to minimize GI side effects.    Marland Kitchen levothyroxine (SYNTHROID) 100 MCG tablet Take 100 mcg by mouth daily before breakfast.    . omeprazole (PRILOSEC) 20 MG capsule Take 20 mg by mouth daily.    . sildenafil (REVATIO) 20 MG tablet Take 20 mg by mouth.    . tamsulosin (FLOMAX) 0.4 MG CAPS capsule Take 0.4 mg by mouth in the morning and at bedtime.    Marland Kitchen warfarin (COUMADIN) 5 MG tablet Take 5 mg by mouth daily. AS DIRECTED     No current facility-administered medications for this visit.    Allergies:   Patient has no known allergies.    Social History:  The patient  reports that he has  quit smoking. His smoking use included cigarettes. He has never used smokeless tobacco.   Family History:  The patient's family history includes Chronic Renal Failure in his brother; Heart attack (age of onset: 25) in his father; Pneumonia in his mother; Prostate cancer in his brother.    ROS:  Please see the history of present illness.   Otherwise, review of systems are positive for none.   All other systems are reviewed and negative.    PHYSICAL EXAM: VS:  BP (!) 104/58   Pulse 82   Ht 5\' 11"  (1.803 m)   Wt 197 lb (89.4 kg)   SpO2 99%   BMI 27.48 kg/m  , BMI Body mass index is 27.48 kg/m. GENERAL:  Well appearing HEENT:  Pupils equal round and reactive, fundi not visualized, oral mucosa unremarkable NECK:  No jugular venous distention, waveform within normal limits, carotid upstroke brisk and symmetric, no bruits, no thyromegaly LYMPHATICS:  No cervical, inguinal  adenopathy LUNGS:  Clear to auscultation bilaterally BACK:  No CVA tenderness CHEST:  Unremarkable HEART:  PMI not displaced or sustained,S1 and S2 within normal limits, no S3, no S4, no clicks, no rubs, soft diastolic murmur heard only with the patient seated murmurs ABD:  Flat, positive bowel sounds normal in frequency in pitch, no bruits, no rebound, no guarding, no midline pulsatile mass, no hepatomegaly, no splenomegaly EXT:  2 plus pulses throughout, left leg edema, no cyanosis no clubbing SKIN:  No rashes no nodules NEURO:  Cranial nerves II through XII grossly intact, motor grossly intact throughout PSYCH:  Cognitively intact, oriented to person place and time    EKG:  EKG is ordered today. The ekg ordered today demonstrates sinus rhythm, rate 78, axis within normal limits, intervals within normal limits, no acute ST-T wave changes.   Recent Labs: No results found for requested labs within last 8760 hours.    Lipid Panel No results found for: CHOL, TRIG, HDL, CHOLHDL, VLDL, LDLCALC, LDLDIRECT    Wt Readings from Last 3 Encounters:  06/23/20 197 lb (89.4 kg)      Other studies Reviewed: Additional studies/ records that were reviewed today include: Office records, echo done at his primary provider office. Review of the above records demonstrates:  Please see elsewhere in the note.     ASSESSMENT AND PLAN:  AORTIC INSUFFICIENCY: He had some aortic insufficiency noted on his echocardiogram.  This was moderate.  At this point I think this can be followed clinically.  I will repeat an echocardiogram in 1 year.  AORTIC ROOT DILATATION:   I will follow this up with the echo as above.  Most likely I will plan a CT angiogram at some point in the future.  FATIGUE/DYSPNEA: The etiology of this is not clear.  Seems to have resolved.  No change in therapy or further work-up.   Current medicines are reviewed at length with the patient today.  The patient does not have concerns  regarding medicines.  The following changes have been made:  no change  Labs/ tests ordered today include:   Orders Placed This Encounter  Procedures  . EKG 12-Lead  . ECHOCARDIOGRAM COMPLETE     Disposition:   FU with me in 12 months   Signed, Minus Breeding, MD  06/23/2020 5:20 PM    [facility]

## 2020-06-23 ENCOUNTER — Ambulatory Visit: Payer: Medicare Other | Admitting: Cardiology

## 2020-06-23 ENCOUNTER — Encounter: Payer: Self-pay | Admitting: Cardiology

## 2020-06-23 VITALS — BP 104/58 | HR 82 | Ht 71.0 in | Wt 197.0 lb

## 2020-06-23 DIAGNOSIS — I351 Nonrheumatic aortic (valve) insufficiency: Secondary | ICD-10-CM

## 2020-06-23 DIAGNOSIS — Z136 Encounter for screening for cardiovascular disorders: Secondary | ICD-10-CM | POA: Diagnosis not present

## 2020-06-23 DIAGNOSIS — I482 Chronic atrial fibrillation, unspecified: Secondary | ICD-10-CM | POA: Diagnosis not present

## 2020-06-23 NOTE — Patient Instructions (Addendum)
Medication Instructions:   Your physician recommends that you continue on your current medications as directed. Please refer to the Current Medication list given to you today.  Labwork:  None  Testing/Procedures: Your physician has requested that you have an echocardiogram 1 year. Echocardiography is a painless test that uses sound waves to create images of your heart. It provides your doctor with information about the size and shape of your heart and how well your heart's chambers and valves are working. This procedure takes approximately one hour. There are no restrictions for this procedure.  Follow-Up:  Your physician recommends that you schedule a follow-up appointment in: 1 year. You will receive a reminder letter in the mail in about 10 months reminding you to call and schedule your appointment. If you don't receive this letter, please contact our office.  Any Other Special Instructions Will Be Listed Below (If Applicable).  If you need a refill on your cardiac medications before your next appointment, please call your pharmacy.

## 2020-06-28 DIAGNOSIS — L570 Actinic keratosis: Secondary | ICD-10-CM | POA: Diagnosis not present

## 2020-06-28 DIAGNOSIS — X32XXXD Exposure to sunlight, subsequent encounter: Secondary | ICD-10-CM | POA: Diagnosis not present

## 2020-06-29 DIAGNOSIS — E1140 Type 2 diabetes mellitus with diabetic neuropathy, unspecified: Secondary | ICD-10-CM | POA: Diagnosis not present

## 2020-06-29 DIAGNOSIS — L89892 Pressure ulcer of other site, stage 2: Secondary | ICD-10-CM | POA: Diagnosis not present

## 2020-06-29 DIAGNOSIS — I739 Peripheral vascular disease, unspecified: Secondary | ICD-10-CM | POA: Diagnosis not present

## 2020-06-29 DIAGNOSIS — M79672 Pain in left foot: Secondary | ICD-10-CM | POA: Diagnosis not present

## 2020-07-01 DIAGNOSIS — L97529 Non-pressure chronic ulcer of other part of left foot with unspecified severity: Secondary | ICD-10-CM | POA: Diagnosis not present

## 2020-07-01 DIAGNOSIS — L03116 Cellulitis of left lower limb: Secondary | ICD-10-CM | POA: Diagnosis not present

## 2020-07-12 DIAGNOSIS — E1151 Type 2 diabetes mellitus with diabetic peripheral angiopathy without gangrene: Secondary | ICD-10-CM | POA: Diagnosis not present

## 2020-07-12 DIAGNOSIS — M79674 Pain in right toe(s): Secondary | ICD-10-CM | POA: Diagnosis not present

## 2020-07-12 DIAGNOSIS — L89893 Pressure ulcer of other site, stage 3: Secondary | ICD-10-CM | POA: Diagnosis not present

## 2020-07-12 DIAGNOSIS — E1140 Type 2 diabetes mellitus with diabetic neuropathy, unspecified: Secondary | ICD-10-CM | POA: Diagnosis not present

## 2020-07-12 DIAGNOSIS — M79672 Pain in left foot: Secondary | ICD-10-CM | POA: Diagnosis not present

## 2020-07-23 DIAGNOSIS — K219 Gastro-esophageal reflux disease without esophagitis: Secondary | ICD-10-CM | POA: Diagnosis not present

## 2020-07-23 DIAGNOSIS — D751 Secondary polycythemia: Secondary | ICD-10-CM | POA: Diagnosis not present

## 2020-07-23 DIAGNOSIS — D473 Essential (hemorrhagic) thrombocythemia: Secondary | ICD-10-CM | POA: Diagnosis not present

## 2020-07-23 DIAGNOSIS — I482 Chronic atrial fibrillation, unspecified: Secondary | ICD-10-CM | POA: Diagnosis not present

## 2020-07-26 DIAGNOSIS — I4891 Unspecified atrial fibrillation: Secondary | ICD-10-CM | POA: Diagnosis not present

## 2020-07-28 IMAGING — US VENOUS DOPPLER ULTRASOUND OF LEFT LOWER EXTREMITY
1 series · 14 of 24 positions shown · non-contrast
Comparison: 04/15/2012

CLINICAL DATA: Pain and edema

EXAM:
LEFT LOWER EXTREMITY VENOUS DOPPLER ULTRASOUND
TECHNIQUE: Gray-scale sonography with compression, as well as color and duplex
ultrasound, were performed to evaluate the deep venous system from
the level of the common femoral vein through the popliteal and
proximal calf veins.

[Series 1: venous doppler ultrasound of left lower extremity · 0.08mm/px · 14 of 75 slices shown]
[im 1/75]
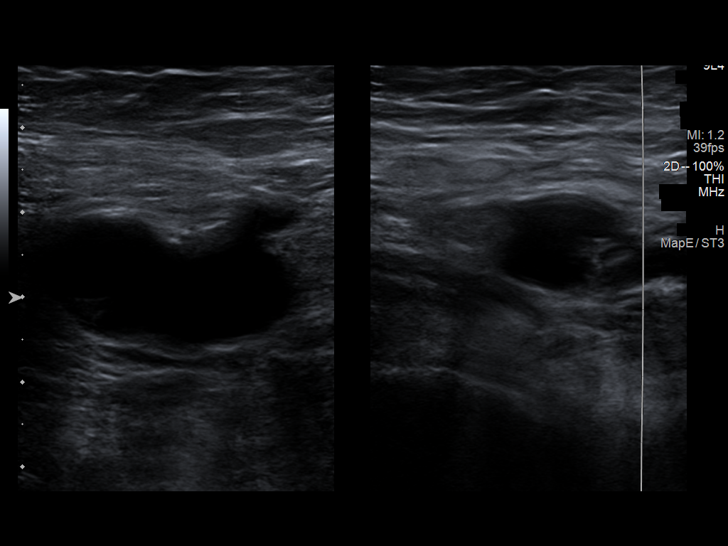
[im 7/75]
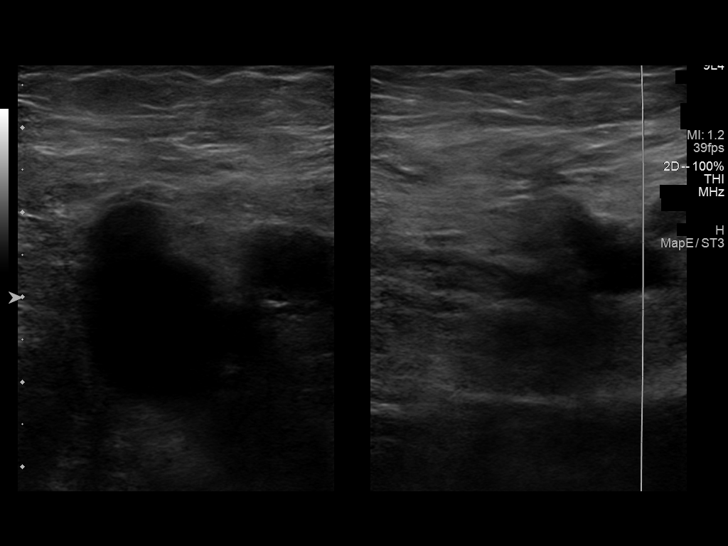
[im 13/75]
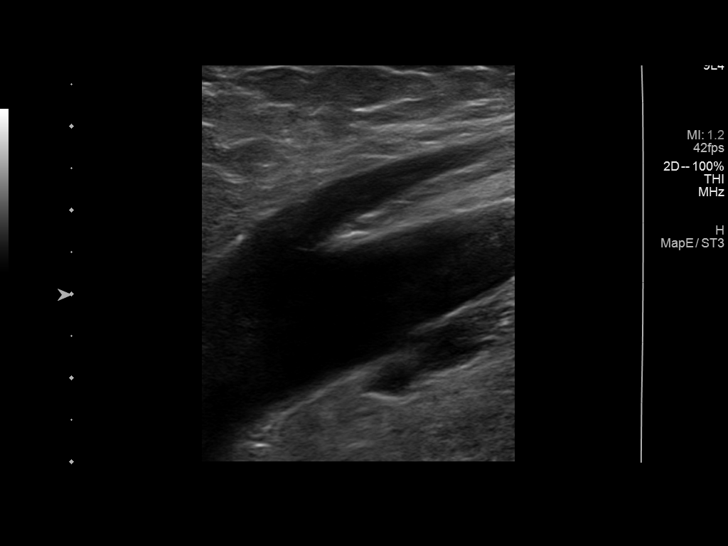
[im 20/75]
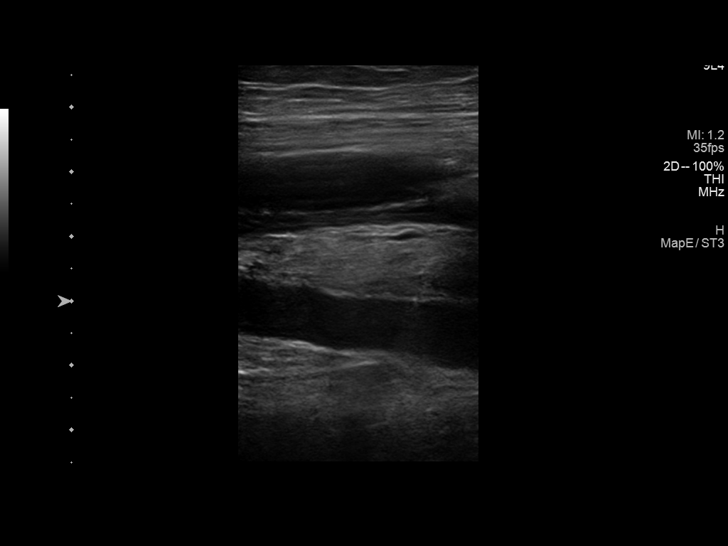
[im 23/75]
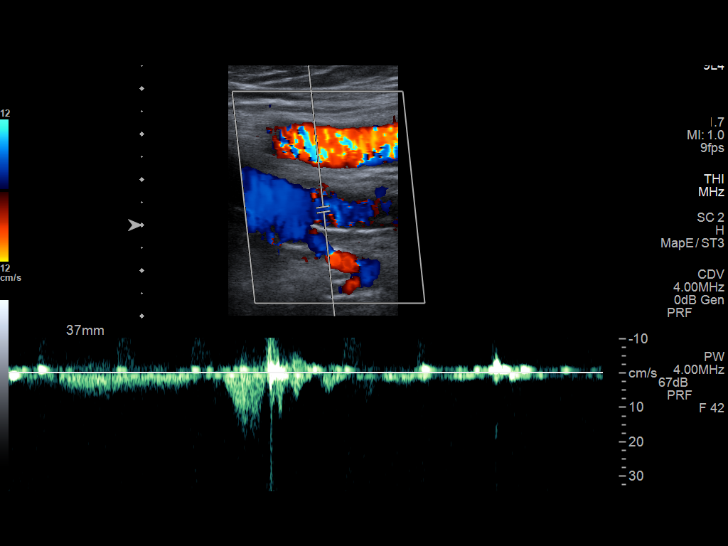
[im 29/75]
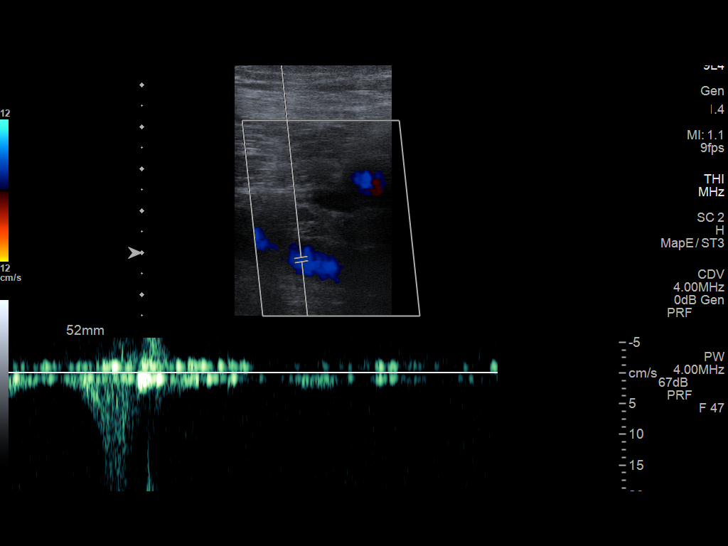
[im 36/75]
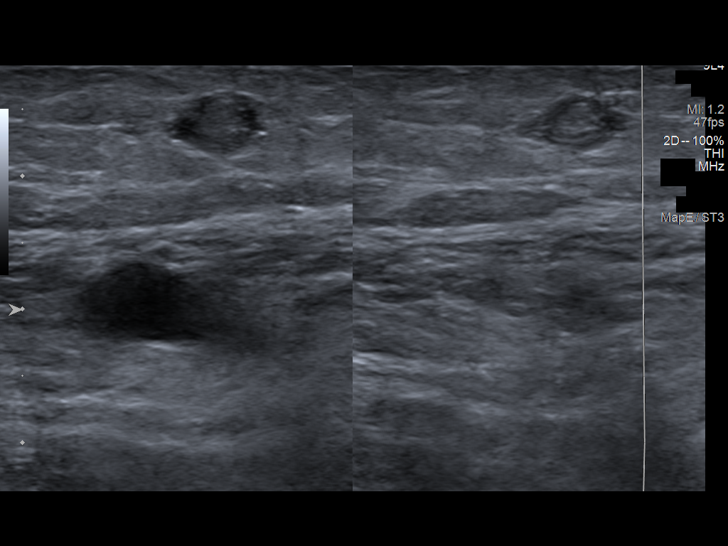
[im 39/75]
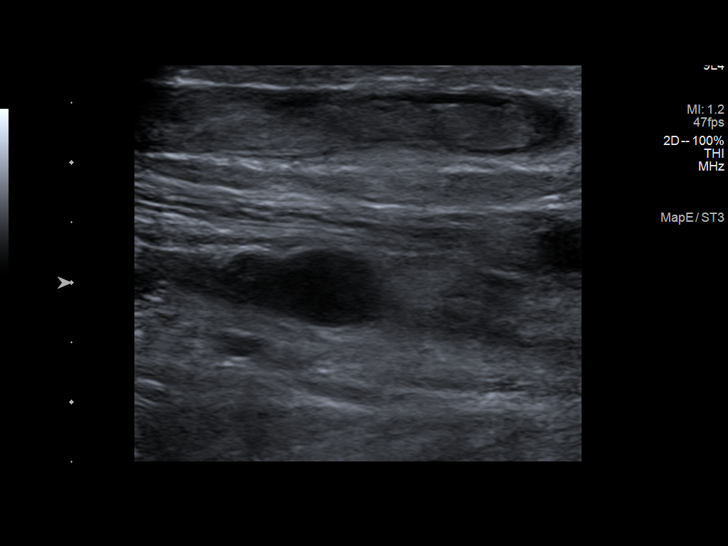
[im 46/75]
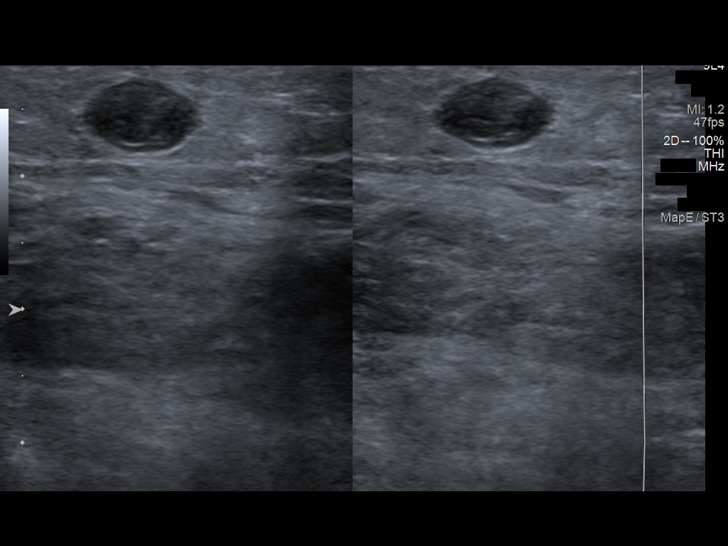
[im 52/75]
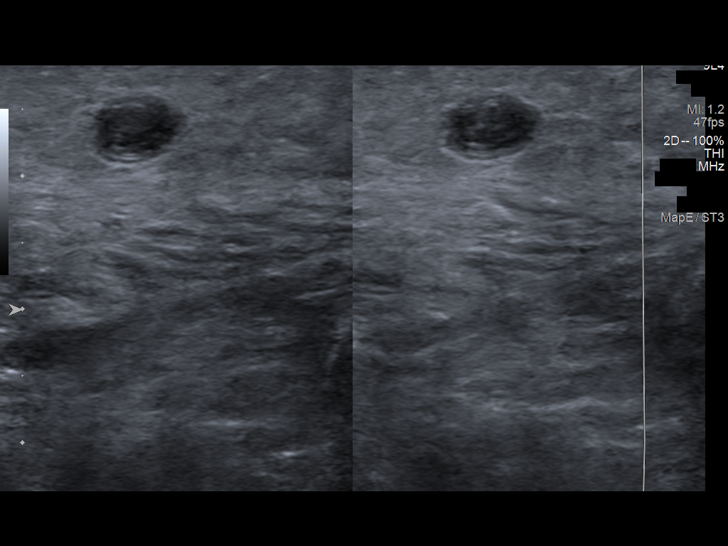
[im 58/75]
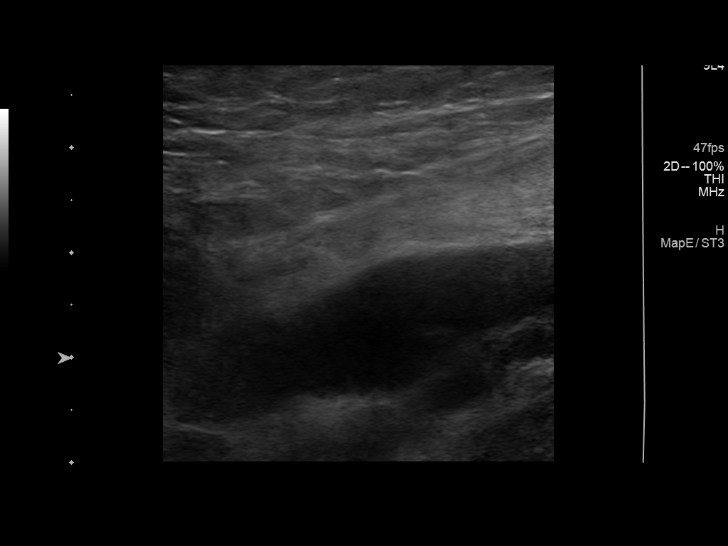
[im 62/75]
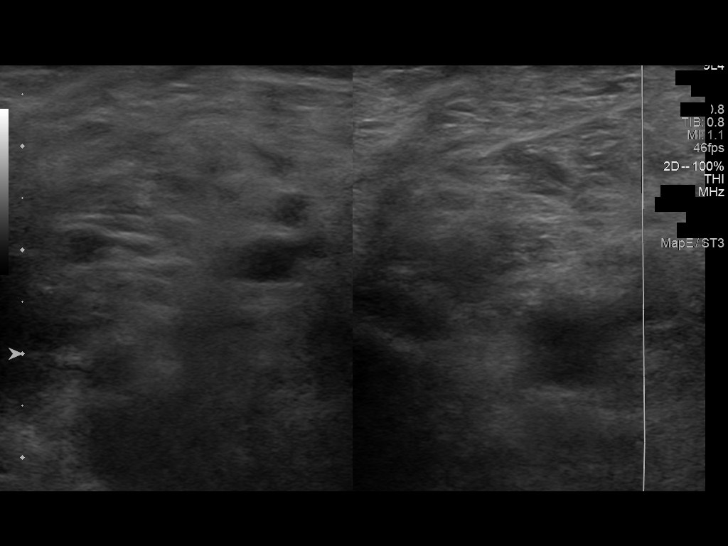
[im 68/75]
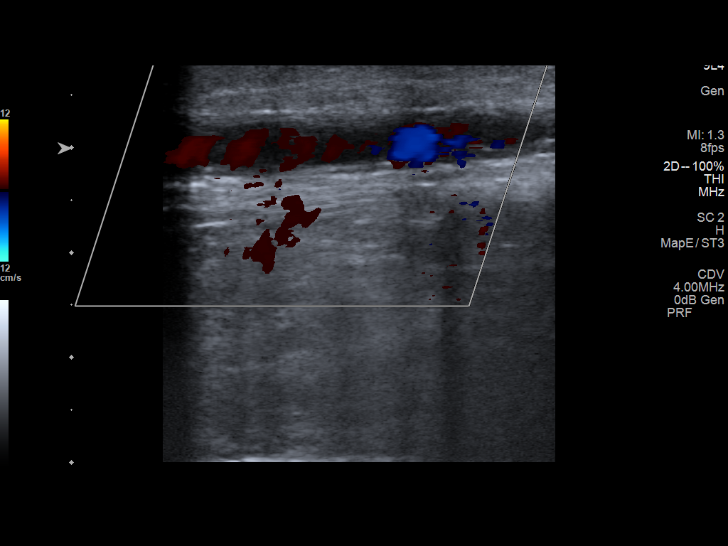
[im 75/75]
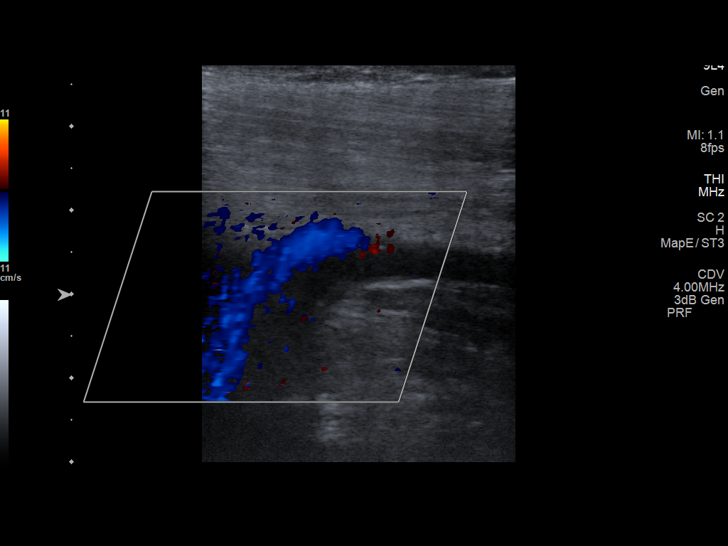

[14 of 24 positions shown; findings below may reference images not displayed]

FINDINGS: Normal compressibility of the common femoral, superficial femoral,
and popliteal veins, as well as the proximal calf veins. No filling
defects to suggest DVT on grayscale or color Doppler imaging.
Doppler waveforms show normal direction of venous flow, normal
respiratory phasicity and response to augmentation. The left great
saphenous vein contains echogenic noncompressible thrombus from the
mid calf to the mid thigh.

Survey views of the contralateral common femoral vein are
unremarkable.
IMPRESSION: 1. Left great saphenous vein superficial thrombophlebitis.
2. No femoropopliteal and no calf DVT in the visualized calf veins.
If clinical symptoms are inconsistent or if there are persistent or
worsening symptoms, further imaging (possibly involving the iliac
veins) may be warranted.

## 2020-07-30 DIAGNOSIS — L89892 Pressure ulcer of other site, stage 2: Secondary | ICD-10-CM | POA: Diagnosis not present

## 2020-07-30 DIAGNOSIS — E1140 Type 2 diabetes mellitus with diabetic neuropathy, unspecified: Secondary | ICD-10-CM | POA: Diagnosis not present

## 2020-07-30 DIAGNOSIS — M79672 Pain in left foot: Secondary | ICD-10-CM | POA: Diagnosis not present

## 2020-08-02 DIAGNOSIS — Z7901 Long term (current) use of anticoagulants: Secondary | ICD-10-CM | POA: Diagnosis not present

## 2020-08-02 DIAGNOSIS — I82812 Embolism and thrombosis of superficial veins of left lower extremities: Secondary | ICD-10-CM | POA: Diagnosis not present

## 2020-08-02 DIAGNOSIS — Z09 Encounter for follow-up examination after completed treatment for conditions other than malignant neoplasm: Secondary | ICD-10-CM | POA: Diagnosis not present

## 2020-08-10 DIAGNOSIS — I482 Chronic atrial fibrillation, unspecified: Secondary | ICD-10-CM | POA: Diagnosis not present

## 2020-08-13 DIAGNOSIS — I82812 Embolism and thrombosis of superficial veins of left lower extremities: Secondary | ICD-10-CM | POA: Diagnosis not present

## 2020-08-13 DIAGNOSIS — Z7901 Long term (current) use of anticoagulants: Secondary | ICD-10-CM | POA: Diagnosis not present

## 2020-08-13 DIAGNOSIS — Z09 Encounter for follow-up examination after completed treatment for conditions other than malignant neoplasm: Secondary | ICD-10-CM | POA: Diagnosis not present

## 2020-08-16 DIAGNOSIS — M79672 Pain in left foot: Secondary | ICD-10-CM | POA: Diagnosis not present

## 2020-08-16 DIAGNOSIS — E1151 Type 2 diabetes mellitus with diabetic peripheral angiopathy without gangrene: Secondary | ICD-10-CM | POA: Diagnosis not present

## 2020-08-16 DIAGNOSIS — M79675 Pain in left toe(s): Secondary | ICD-10-CM | POA: Diagnosis not present

## 2020-08-16 DIAGNOSIS — L89892 Pressure ulcer of other site, stage 2: Secondary | ICD-10-CM | POA: Diagnosis not present

## 2020-08-16 DIAGNOSIS — E1140 Type 2 diabetes mellitus with diabetic neuropathy, unspecified: Secondary | ICD-10-CM | POA: Diagnosis not present

## 2020-08-21 DIAGNOSIS — D473 Essential (hemorrhagic) thrombocythemia: Secondary | ICD-10-CM | POA: Diagnosis not present

## 2020-08-21 DIAGNOSIS — I482 Chronic atrial fibrillation, unspecified: Secondary | ICD-10-CM | POA: Diagnosis not present

## 2020-08-21 DIAGNOSIS — D751 Secondary polycythemia: Secondary | ICD-10-CM | POA: Diagnosis not present

## 2020-08-21 DIAGNOSIS — K219 Gastro-esophageal reflux disease without esophagitis: Secondary | ICD-10-CM | POA: Diagnosis not present

## 2020-08-23 DIAGNOSIS — I482 Chronic atrial fibrillation, unspecified: Secondary | ICD-10-CM | POA: Diagnosis not present

## 2020-08-30 DIAGNOSIS — I4891 Unspecified atrial fibrillation: Secondary | ICD-10-CM | POA: Diagnosis not present

## 2020-09-06 DIAGNOSIS — L89892 Pressure ulcer of other site, stage 2: Secondary | ICD-10-CM | POA: Diagnosis not present

## 2020-09-06 DIAGNOSIS — M79675 Pain in left toe(s): Secondary | ICD-10-CM | POA: Diagnosis not present

## 2020-09-06 DIAGNOSIS — E1140 Type 2 diabetes mellitus with diabetic neuropathy, unspecified: Secondary | ICD-10-CM | POA: Diagnosis not present

## 2020-09-06 DIAGNOSIS — E1151 Type 2 diabetes mellitus with diabetic peripheral angiopathy without gangrene: Secondary | ICD-10-CM | POA: Diagnosis not present

## 2020-09-06 DIAGNOSIS — M79672 Pain in left foot: Secondary | ICD-10-CM | POA: Diagnosis not present

## 2020-09-07 DIAGNOSIS — D529 Folate deficiency anemia, unspecified: Secondary | ICD-10-CM | POA: Diagnosis not present

## 2020-09-07 DIAGNOSIS — I482 Chronic atrial fibrillation, unspecified: Secondary | ICD-10-CM | POA: Diagnosis not present

## 2020-09-07 DIAGNOSIS — D473 Essential (hemorrhagic) thrombocythemia: Secondary | ICD-10-CM | POA: Diagnosis not present

## 2020-09-07 DIAGNOSIS — E7849 Other hyperlipidemia: Secondary | ICD-10-CM | POA: Diagnosis not present

## 2020-09-07 DIAGNOSIS — D649 Anemia, unspecified: Secondary | ICD-10-CM | POA: Diagnosis not present

## 2020-09-07 DIAGNOSIS — E039 Hypothyroidism, unspecified: Secondary | ICD-10-CM | POA: Diagnosis not present

## 2020-09-07 DIAGNOSIS — D519 Vitamin B12 deficiency anemia, unspecified: Secondary | ICD-10-CM | POA: Diagnosis not present

## 2020-09-13 DIAGNOSIS — G2581 Restless legs syndrome: Secondary | ICD-10-CM | POA: Diagnosis not present

## 2020-09-13 DIAGNOSIS — I4891 Unspecified atrial fibrillation: Secondary | ICD-10-CM | POA: Diagnosis not present

## 2020-09-13 DIAGNOSIS — E039 Hypothyroidism, unspecified: Secondary | ICD-10-CM | POA: Diagnosis not present

## 2020-09-13 DIAGNOSIS — D473 Essential (hemorrhagic) thrombocythemia: Secondary | ICD-10-CM | POA: Diagnosis not present

## 2020-09-13 DIAGNOSIS — E7849 Other hyperlipidemia: Secondary | ICD-10-CM | POA: Diagnosis not present

## 2020-09-13 DIAGNOSIS — D751 Secondary polycythemia: Secondary | ICD-10-CM | POA: Diagnosis not present

## 2020-09-20 DIAGNOSIS — D751 Secondary polycythemia: Secondary | ICD-10-CM | POA: Diagnosis not present

## 2020-09-20 DIAGNOSIS — I482 Chronic atrial fibrillation, unspecified: Secondary | ICD-10-CM | POA: Diagnosis not present

## 2020-09-20 DIAGNOSIS — K219 Gastro-esophageal reflux disease without esophagitis: Secondary | ICD-10-CM | POA: Diagnosis not present

## 2020-09-20 DIAGNOSIS — D473 Essential (hemorrhagic) thrombocythemia: Secondary | ICD-10-CM | POA: Diagnosis not present

## 2020-09-21 DIAGNOSIS — I4891 Unspecified atrial fibrillation: Secondary | ICD-10-CM | POA: Diagnosis not present

## 2020-09-21 HISTORY — PX: CATARACT EXTRACTION: SUR2

## 2020-09-23 DIAGNOSIS — H01002 Unspecified blepharitis right lower eyelid: Secondary | ICD-10-CM | POA: Diagnosis not present

## 2020-09-23 DIAGNOSIS — H353132 Nonexudative age-related macular degeneration, bilateral, intermediate dry stage: Secondary | ICD-10-CM | POA: Diagnosis not present

## 2020-09-23 DIAGNOSIS — H01001 Unspecified blepharitis right upper eyelid: Secondary | ICD-10-CM | POA: Diagnosis not present

## 2020-09-23 DIAGNOSIS — H01004 Unspecified blepharitis left upper eyelid: Secondary | ICD-10-CM | POA: Diagnosis not present

## 2020-09-23 DIAGNOSIS — H25813 Combined forms of age-related cataract, bilateral: Secondary | ICD-10-CM | POA: Diagnosis not present

## 2020-09-23 DIAGNOSIS — H348311 Tributary (branch) retinal vein occlusion, right eye, with retinal neovascularization: Secondary | ICD-10-CM | POA: Diagnosis not present

## 2020-09-29 NOTE — H&P (Signed)
Surgical History & Physical  Patient Name: Alexander Maynard DOB: Nov 16, 1945  Surgery: Cataract extraction with intraocular lens implant phacoemulsification; Left Eye  Surgeon: Baruch Goldmann MD Surgery Date:  10/08/2020 Pre-Op Date:  09/23/2020  HPI: A 41 Yr. old male patient is referred from [facility] for cataract eval. 1. The patient complains of nighttime light - car headlights, street lamps etc. glare causing poor vision, which began 6 months ago. Both eyes are affected. The episode is gradual. The condition's severity increased since last visit. Symptoms occur when the patient is driving, inside and outside. This is negatively affecting his quality of life. HPI was performed by Baruch Goldmann .  Medical History: Cataracts BRVO OD LDL Thyroid Problems  Review of Systems Negative Allergic/Immunologic Negative Cardiovascular Negative Constitutional Negative Ear, Nose, Mouth & Throat Negative Endocrine Negative Eyes Negative Gastrointestinal Negative Genitourinary Negative Hemotologic/Lymphatic Negative Integumentary Negative Musculoskeletal Negative Neurological Negative Psychiatry Negative Respiratory  Social   Never smoked   Medication Omeprazole, Hydroxyurea, Tamsulosin, Triamcinolone acetonide, Levothyroxine Sodium, Gabapentin,   Sx/Procedures  None  Drug Allergies   NKDA  History & Physical: Heent:  Cataract, Left eye NECK: supple without bruits LUNGS: lungs clear to auscultation CV: regular rate and rhythm Abdomen: soft and non-tender  Impression & Plan: Assessment: 1.  COMBINED FORMS AGE RELATED CATARACT; Both Eyes (H25.813) 2.  BLEPHARITIS; Right Upper Lid, Right Lower Lid, Left Upper Lid, Left Lower Lid (H01.001, H01.002,H01.004,H01.005) 3.  DERMATOCHALASIS; Right Upper Lid, Left Upper Lid (H02.831, H02.834) 4.  Age-related Macular Degeneration, DRY; Both Eyes Intermediate (H35.3132) 5.  BRVO; Right Eye w/ret  neovascularization (O97.3532) 6.  ASTIGMATISM, REGULAR; Both Eyes (H52.223)  Plan: 1.  Cataract accounts for the patient's decreased vision. This visual impairment is not correctable with a tolerable change in glasses or contact lenses. Cataract surgery with an implantation of a new lens should significantly improve the visual and functional status of the patient. Discussed all risks, benefits, alternatives, and potential complications. Discussed the procedures and recovery. Patient desires to have surgery. A-scan ordered and performed today for intra-ocular lens calculations. The surgery will be performed in order to improve vision for driving, reading, and for eye examinations. Recommend phacoemulsification with intra-ocular lens. Recommend Dextenza for post-operative pain and inflammation. Left Eye first (will give OD retina treatment time). Dilates well - shugarcaine by protocol. Omidira. Flomax. Toric Lens. 2.  Regular lid cleaning. 3.  Asymptomatic, recommend observation for now. Findings, prognosis and treatment options reviewed. 4.  Findings, prognosis and treatment options reviewed. OCT macula today. 5.  Recommend retina injections per retinal specialist. appreciate his assistance with this very pleasant patient. 6.  Recommend Toric Lens

## 2020-10-04 ENCOUNTER — Other Ambulatory Visit: Payer: Self-pay

## 2020-10-04 ENCOUNTER — Encounter (HOSPITAL_COMMUNITY)
Admission: RE | Admit: 2020-10-04 | Discharge: 2020-10-04 | Disposition: A | Discharge status: Home / Self Care | Payer: Medicare Other | Source: Ambulatory Visit | Attending: Ophthalmology | Admitting: Ophthalmology

## 2020-10-04 DIAGNOSIS — H25812 Combined forms of age-related cataract, left eye: Secondary | ICD-10-CM | POA: Diagnosis not present

## 2020-10-06 ENCOUNTER — Other Ambulatory Visit: Payer: Self-pay

## 2020-10-06 ENCOUNTER — Other Ambulatory Visit (HOSPITAL_COMMUNITY)
Admission: RE | Admit: 2020-10-06 | Discharge: 2020-10-06 | Disposition: A | Discharge status: Home / Self Care | Payer: No Typology Code available for payment source | Source: Ambulatory Visit | Attending: Ophthalmology | Admitting: Ophthalmology

## 2020-10-06 DIAGNOSIS — Z01812 Encounter for preprocedural laboratory examination: Secondary | ICD-10-CM | POA: Insufficient documentation

## 2020-10-06 DIAGNOSIS — Z20822 Contact with and (suspected) exposure to covid-19: Secondary | ICD-10-CM | POA: Insufficient documentation

## 2020-10-06 LAB — SARS CORONAVIRUS 2 (TAT 6-24 HRS): SARS Coronavirus 2: NEGATIVE

## 2020-10-08 ENCOUNTER — Ambulatory Visit (HOSPITAL_COMMUNITY): Payer: No Typology Code available for payment source | Admitting: Anesthesiology

## 2020-10-08 ENCOUNTER — Encounter (HOSPITAL_COMMUNITY): Payer: Self-pay | Admitting: Ophthalmology

## 2020-10-08 ENCOUNTER — Other Ambulatory Visit: Payer: Self-pay

## 2020-10-08 ENCOUNTER — Encounter (HOSPITAL_COMMUNITY)
Admission: RE | Disposition: A | Discharge status: Home / Self Care | Payer: Self-pay | Source: Home / Self Care | Attending: Ophthalmology

## 2020-10-08 ENCOUNTER — Ambulatory Visit (HOSPITAL_COMMUNITY)
Admission: RE | Admit: 2020-10-08 | Discharge: 2020-10-08 | Disposition: A | Discharge status: Home / Self Care | Payer: No Typology Code available for payment source | Attending: Ophthalmology | Admitting: Ophthalmology

## 2020-10-08 DIAGNOSIS — H0100B Unspecified blepharitis left eye, upper and lower eyelids: Secondary | ICD-10-CM | POA: Diagnosis not present

## 2020-10-08 DIAGNOSIS — Z79890 Hormone replacement therapy (postmenopausal): Secondary | ICD-10-CM | POA: Insufficient documentation

## 2020-10-08 DIAGNOSIS — H02834 Dermatochalasis of left upper eyelid: Secondary | ICD-10-CM | POA: Diagnosis not present

## 2020-10-08 DIAGNOSIS — H52202 Unspecified astigmatism, left eye: Secondary | ICD-10-CM | POA: Diagnosis not present

## 2020-10-08 DIAGNOSIS — H02831 Dermatochalasis of right upper eyelid: Secondary | ICD-10-CM | POA: Insufficient documentation

## 2020-10-08 DIAGNOSIS — Z79899 Other long term (current) drug therapy: Secondary | ICD-10-CM | POA: Diagnosis not present

## 2020-10-08 DIAGNOSIS — I4891 Unspecified atrial fibrillation: Secondary | ICD-10-CM | POA: Diagnosis not present

## 2020-10-08 DIAGNOSIS — H25812 Combined forms of age-related cataract, left eye: Secondary | ICD-10-CM | POA: Insufficient documentation

## 2020-10-08 DIAGNOSIS — H0100A Unspecified blepharitis right eye, upper and lower eyelids: Secondary | ICD-10-CM | POA: Diagnosis not present

## 2020-10-08 DIAGNOSIS — H353132 Nonexudative age-related macular degeneration, bilateral, intermediate dry stage: Secondary | ICD-10-CM | POA: Insufficient documentation

## 2020-10-08 HISTORY — PX: CATARACT EXTRACTION W/PHACO: SHX586

## 2020-10-08 SURGERY — PHACOEMULSIFICATION, CATARACT, WITH IOL INSERTION
Anesthesia: Monitor Anesthesia Care | Site: Eye | Laterality: Left

## 2020-10-08 MED ORDER — LIDOCAINE HCL (PF) 1 % IJ SOLN
INTRAOCULAR | Status: DC | PRN
  Administered 2020-10-08: 1 mL via OPHTHALMIC

## 2020-10-08 MED ORDER — TETRACAINE HCL 0.5 % OP SOLN
1.0000 [drp] | OPHTHALMIC | Status: AC | PRN
  Administered 2020-10-08 (×3): 1 [drp] via OPHTHALMIC

## 2020-10-08 MED ORDER — PHENYLEPHRINE-KETOROLAC 1-0.3 % IO SOLN
INTRAOCULAR | Status: AC
  Filled 2020-10-08: qty 4

## 2020-10-08 MED ORDER — PROVISC 10 MG/ML IO SOLN
INTRAOCULAR | Status: DC | PRN
  Administered 2020-10-08: 0.85 mL via INTRAOCULAR

## 2020-10-08 MED ORDER — NEOMYCIN-POLYMYXIN-DEXAMETH 3.5-10000-0.1 OP SUSP
OPHTHALMIC | Status: DC | PRN
  Administered 2020-10-08: 1 [drp] via OPHTHALMIC

## 2020-10-08 MED ORDER — POVIDONE-IODINE 5 % OP SOLN
OPHTHALMIC | Status: DC | PRN
  Administered 2020-10-08: 1 via OPHTHALMIC

## 2020-10-08 MED ORDER — LIDOCAINE HCL 3.5 % OP GEL
1.0000 "application " | Freq: Once | OPHTHALMIC | Status: AC
  Administered 2020-10-08: 1 via OPHTHALMIC

## 2020-10-08 MED ORDER — PHENYLEPHRINE HCL 2.5 % OP SOLN
1.0000 [drp] | OPHTHALMIC | Status: AC | PRN
  Administered 2020-10-08 (×3): 1 [drp] via OPHTHALMIC

## 2020-10-08 MED ORDER — TROPICAMIDE 1 % OP SOLN
1.0000 [drp] | OPHTHALMIC | Status: AC
  Administered 2020-10-08 (×3): 1 [drp] via OPHTHALMIC

## 2020-10-08 MED ORDER — EPINEPHRINE PF 1 MG/ML IJ SOLN
INTRAMUSCULAR | Status: AC
  Filled 2020-10-08: qty 1

## 2020-10-08 MED ORDER — BSS IO SOLN
INTRAOCULAR | Status: DC | PRN
  Administered 2020-10-08: 15 mL via INTRAOCULAR

## 2020-10-08 MED ORDER — SODIUM HYALURONATE 23 MG/ML IO SOLN
INTRAOCULAR | Status: DC | PRN
  Administered 2020-10-08: 0.6 mL via INTRAOCULAR

## 2020-10-08 MED ORDER — STERILE WATER FOR IRRIGATION IR SOLN
Status: DC | PRN
  Administered 2020-10-08: 250 mL

## 2020-10-08 MED ORDER — PHENYLEPHRINE-KETOROLAC 1-0.3 % IO SOLN
INTRAOCULAR | Status: DC | PRN
  Administered 2020-10-08: 500 mL via OPHTHALMIC

## 2020-10-08 SURGICAL SUPPLY — 19 items
CLOTH BEACON ORANGE TIMEOUT ST (SAFETY) ×1 IMPLANT
DEVICE MILOOP (MISCELLANEOUS) IMPLANT
EYE SHIELD UNIVERSAL CLEAR (GAUZE/BANDAGES/DRESSINGS) ×1 IMPLANT
GLOVE SURG UNDER POLY LF SZ6.5 (GLOVE) ×1 IMPLANT
GLOVE SURG UNDER POLY LF SZ7 (GLOVE) ×1 IMPLANT
MILOOP DEVICE (MISCELLANEOUS)
NDL HYPO 18GX1.5 BLUNT FILL (NEEDLE) IMPLANT
NEEDLE HYPO 18GX1.5 BLUNT FILL (NEEDLE) ×2 IMPLANT
PAD ARMBOARD 7.5X6 YLW CONV (MISCELLANEOUS) ×1 IMPLANT
PROC W SPEC LENS (INTRAOCULAR LENS)
PROCESS W SPEC LENS (INTRAOCULAR LENS) IMPLANT
RING MALYGIN (MISCELLANEOUS) IMPLANT
RING MALYGIN 7.0 (MISCELLANEOUS) IMPLANT
SYR TB 1ML LL NO SAFETY (SYRINGE) ×1 IMPLANT
TAPE SURG TRANSPORE 1 IN (GAUZE/BANDAGES/DRESSINGS) IMPLANT
TAPE SURGICAL TRANSPORE 1 IN (GAUZE/BANDAGES/DRESSINGS) ×2
TECNIS EYHANCE TORIC II  IOL (Intraocular Lens) ×1 IMPLANT
VISCOELASTIC ADDITIONAL (OPHTHALMIC RELATED) IMPLANT
WATER STERILE IRR 250ML POUR (IV SOLUTION) ×1 IMPLANT

## 2020-10-08 NOTE — Anesthesia Preprocedure Evaluation (Signed)
Anesthesia Evaluation  Patient identified by MRN, date of birth, ID band Patient awake    Reviewed: Allergy & Precautions, NPO status , Patient's Chart, lab work & pertinent test results  History of Anesthesia Complications Negative for: history of anesthetic complications  Airway Mallampati: II  TM Distance: >3 FB Neck ROM: Full    Dental  (+) Dental Advisory Given, Upper Dentures, Lower Dentures   Pulmonary former smoker,    Pulmonary exam normal breath sounds clear to auscultation       Cardiovascular Exercise Tolerance: Good + dysrhythmias Atrial Fibrillation  Rhythm:Irregular Rate:Normal     Neuro/Psych  Neuromuscular disease negative psych ROS   GI/Hepatic negative GI ROS, Neg liver ROS,   Endo/Other  Hypothyroidism   Renal/GU negative Renal ROS     Musculoskeletal negative musculoskeletal ROS (+)   Abdominal   Peds  Hematology negative hematology ROS (+)   Anesthesia Other Findings   Reproductive/Obstetrics negative OB ROS                             Anesthesia Physical Anesthesia Plan  ASA: II  Anesthesia Plan: MAC   Post-op Pain Management:    Induction:   PONV Risk Score and Plan:   Airway Management Planned: Nasal Cannula and Natural Airway  Additional Equipment:   Intra-op Plan:   Post-operative Plan:   Informed Consent: I have reviewed the patients History and Physical, chart, labs and discussed the procedure including the risks, benefits and alternatives for the proposed anesthesia with the patient or authorized representative who has indicated his/her understanding and acceptance.       Plan Discussed with: CRNA and Surgeon  Anesthesia Plan Comments:         Anesthesia Quick Evaluation

## 2020-10-08 NOTE — Op Note (Signed)
Date of procedure: October 08, 2020  Pre-operative diagnosis: Visually significant combined-form age-related cataract, Left Eye; Visually Significant Astigmatism, Left Eye (H25.812)  Post-operative diagnosis: Visually significant age-related cataract, Left Eye; Visually Significant Astigmatism, Left Eye  Procedure: Removal of cataract via phacoemulsification and insertion of intra-ocular lens Alexander Maynard and Johnson [id number] +17.0D into the capsular bag of the Left Eye  Attending surgeon: Gerda Diss. Wrzosek, MD, MA  Anesthesia: MAC, Topical Akten  Complications: None  Estimated Blood Loss: <33m (minimal)  Specimens: None  Implants: As above  Indications:  Visually significant age-related cataract, Left Eye; Visually Significant Astigmatism, Left Eye  Procedure:  The patient was seen and identified in the pre-operative area. The operative eye was identified and dilated.  The operative eye was marked.  Pre-operative toric markers were used to mark the eye at 0 and 180 degrees. Topical anesthesia was administered to the operative eye.     The patient was then to the operative suite and placed in the supine position.  A timeout was performed confirming the patient, procedure to be performed, and all other relevant information.   The patient's face was prepped and draped in the usual fashion for intra-ocular surgery.  A lid speculum was placed into the operative eye and the surgical microscope moved into place and focused.  A superotemporal paracentesis was created using a 20 gauge paracentesis blade.  Shugarcaine was injected into the anterior chamber.  Viscoelastic was injected into the anterior chamber.  A temporal clear-corneal main wound incision was created using a 2.468mmicrokeratome.  A continuous curvilinear capsulorrhexis was initiated using an irrigating cystitome and completed using capsulorrhexis forceps.  Hydrodissection and hydrodeliniation were performed.  Viscoelastic was injected into the  anterior chamber.  A phacoemulsification handpiece and a chopper as a second instrument were used to remove the nucleus and epinucleus. The irrigation/aspiration handpiece was used to remove any remaining cortical material.   The capsular bag was reinflated with viscoelastic, checked, and found to be intact.  The eye was marked to the per-op meridian.  The intraocular lens was inserted into the capsular bag and dialed into place using a Kuglen hook to 145 degrees.  The irrigation/aspiration handpiece was used to remove any remaining viscoelastic.  The clear corneal wound and paracentesis wounds were then hydrated and checked with Weck-Cels to be watertight.  The lid-speculum and drape was removed, and the patient's face was cleaned with a wet and dry 4x4.  Maxitrol was instilled in the eye before a clear shield was taped over the eye. The patient was taken to the post-operative care unit in good condition, having tolerated the procedure well.  Post-Op Instructions: [facility] and will receive all other orders and instructions.

## 2020-10-08 NOTE — Discharge Instructions (Signed)
Please discharge patient when stable, will follow up today with Dr. Anandi Abramo at the [facility] [place] office immediately following discharge.  Leave shield in place until visit.  All paperwork with discharge instructions will be given at the office.  [facility] Address:  [address]  [place], [place] [address]  

## 2020-10-08 NOTE — Anesthesia Procedure Notes (Signed)
Procedure Name: MAC Date/Time: 10/08/2020 1:23 PM Performed by: Vista Deck, CRNA Pre-anesthesia Checklist: Patient identified, Emergency Drugs available, Suction available, Timeout performed and Patient being monitored Patient Re-evaluated:Patient Re-evaluated prior to induction Oxygen Delivery Method: Nasal Cannula

## 2020-10-08 NOTE — Anesthesia Postprocedure Evaluation (Signed)
Anesthesia Post Note  Patient: Alexander Maynard  Procedure(s) Performed: CATARACT EXTRACTION PHACO AND INTRAOCULAR LENS PLACEMENT LEFT EYE (Left Eye)  Patient location during evaluation: Short Stay Anesthesia Type: MAC Level of consciousness: awake and alert and patient cooperative Pain management: satisfactory to patient Vital Signs Assessment: post-procedure vital signs reviewed and stable Respiratory status: spontaneous breathing Cardiovascular status: stable Postop Assessment: no apparent nausea or vomiting Anesthetic complications: no   No complications documented.   Last Vitals:  Vitals:   10/08/20 1227  BP: 106/73  Pulse: 74  Resp: 19  SpO2: 100%    Last Pain:  Vitals:   10/08/20 1227  TempSrc: Oral  PainSc: 0-No pain                 Tiawana Forgy

## 2020-10-08 NOTE — Transfer of Care (Signed)
Immediate Anesthesia Transfer of Care Note  Patient: Alexander Maynard  Procedure(s) Performed: CATARACT EXTRACTION PHACO AND INTRAOCULAR LENS PLACEMENT LEFT EYE (Left Eye)  Patient Location: Short Stay  Anesthesia Type:MAC  Level of Consciousness: awake, alert  and patient cooperative  Airway & Oxygen Therapy: Patient Spontanous Breathing  Post-op Assessment: Report given to RN and Post -op Vital signs reviewed and stable  Post vital signs: Reviewed and stable  Last Vitals:  Vitals Value Taken Time  BP    Temp    Pulse    Resp    SpO2      Last Pain:  Vitals:   10/08/20 1227  TempSrc: Oral  PainSc: 0-No pain         Complications: No complications documented.

## 2020-10-08 NOTE — Interval H&P Note (Signed)
History and Physical Interval Note:  10/08/2020 1:19 PM  Alexander Maynard  has presented today for surgery, with the diagnosis of Nuclear sclerotic cataract - Left eye.  The various methods of treatment have been discussed with the patient and family. After consideration of risks, benefits and other options for treatment, the patient has consented to  Procedure(s) with comments: CATARACT EXTRACTION PHACO AND INTRAOCULAR LENS PLACEMENT LEFT EYE (Left) - left as a surgical intervention.  The patient's history has been reviewed, patient examined, no change in status, stable for surgery.  I have reviewed the patient's chart and labs.  Questions were answered to the patient's satisfaction.     Baruch Goldmann

## 2020-10-11 ENCOUNTER — Encounter (HOSPITAL_COMMUNITY): Payer: Self-pay | Admitting: Ophthalmology

## 2020-10-15 NOTE — H&P (Signed)
Surgical History & Physical  Patient Name: Alexander Maynard DOB: 08/30/1945  Surgery: Cataract extraction with intraocular lens implant phacoemulsification; Right Eye  Surgeon: Baruch Goldmann MD Surgery Date:  10/22/2020 Pre-Op Date:  10/14/2020  HPI: A 1 Yr. old male patient 1. The patient complains of nighttime light - car headlights, street lamps etc. glare causing poor vision, which began 6 months ago. The right eye is affected. The episode is gradual. The condition's severity increased since last visit. Symptoms occur when the patient is driving and outside. 2. The patient is returning after cataract post-op. The left eye is affected. Status post cataract post-op, which began 1 week ago: Since the last visit, the affected area is doing well. The patient's vision is improved. Patient is following medication instructions.  Medical History: Cataracts BRVO OD LDL Thyroid Problems  Review of Systems Negative Allergic/Immunologic Negative Cardiovascular Negative Constitutional Negative Ear, Nose, Mouth & Throat Negative Endocrine Negative Eyes Negative Gastrointestinal Negative Genitourinary Negative Hemotologic/Lymphatic Negative Integumentary Negative Musculoskeletal Negative Neurological Negative Psychiatry Negative Respiratory  Social   Never smoked  Medication Prednisolone-gatiflox-bromfenac,  Omeprazole, Hydroxyurea, Tamsulosin, Triamcinolone acetonide, Levothyroxine Sodium, Gabapentin,   Sx/Procedures Phaco c IOL with Toric IOL,   Drug Allergies   NKDA  History & Physical: Heent:  Cataract, Right eye NECK: supple without bruits LUNGS: lungs clear to auscultation CV: regular rate and rhythm Abdomen: soft and non-tender  Impression & Plan: Assessment: 1.  CATARACT EXTRACTION STATUS; Left Eye (Z98.42) 2.  COMBINED FORMS AGE RELATED CATARACT; Right Eye (H25.811) 3.  ASTIGMATISM, REGULAR; Both Eyes (H52.223)  Plan: 1.  1 week after cataract surgery. Doing  well with improved vision and normal eye pressure. Call with any problems or concerns. Continue Pred-Moxi-Brom 2x/day for 3 more weeks.  2.  Cataract accounts for the patient's decreased vision. This visual impairment is not correctable with a tolerable change in glasses or contact lenses. Cataract surgery with an implantation of a new lens should significantly improve the visual and functional status of the patient. Discussed all risks, benefits, alternatives, and potential complications. Discussed the procedures and recovery. Patient desires to have surgery. A-scan ordered and performed today for intra-ocular lens calculations. The surgery will be performed in order to improve vision for driving, reading, and for eye examinations. Recommend phacoemulsification with intra-ocular lens. Recommend Dextenza for post-operative pain and inflammation. Right Eye. Surgery required to correct imbalance of vision. Dilates poorly - shugacaine by protocol. Malyugin Ring. Omidira. Toric Lens.  3.  Recommend Toric Lens

## 2020-10-18 ENCOUNTER — Encounter (HOSPITAL_COMMUNITY): Payer: Self-pay

## 2020-10-18 ENCOUNTER — Encounter (HOSPITAL_COMMUNITY)
Admit: 2020-10-18 | Discharge: 2020-10-18 | Disposition: A | Discharge status: Home / Self Care | Payer: No Typology Code available for payment source | Attending: Ophthalmology | Admitting: Ophthalmology

## 2020-10-18 ENCOUNTER — Other Ambulatory Visit: Payer: Self-pay

## 2020-10-18 DIAGNOSIS — H25811 Combined forms of age-related cataract, right eye: Secondary | ICD-10-CM | POA: Diagnosis not present

## 2020-10-18 DIAGNOSIS — I4891 Unspecified atrial fibrillation: Secondary | ICD-10-CM | POA: Diagnosis not present

## 2020-10-20 ENCOUNTER — Other Ambulatory Visit: Payer: Self-pay

## 2020-10-20 ENCOUNTER — Other Ambulatory Visit (HOSPITAL_COMMUNITY)
Admission: RE | Admit: 2020-10-20 | Discharge: 2020-10-20 | Disposition: A | Discharge status: Home / Self Care | Payer: No Typology Code available for payment source | Source: Ambulatory Visit | Attending: Ophthalmology | Admitting: Ophthalmology

## 2020-10-20 DIAGNOSIS — D751 Secondary polycythemia: Secondary | ICD-10-CM | POA: Diagnosis not present

## 2020-10-20 DIAGNOSIS — K219 Gastro-esophageal reflux disease without esophagitis: Secondary | ICD-10-CM | POA: Diagnosis not present

## 2020-10-20 DIAGNOSIS — Z20822 Contact with and (suspected) exposure to covid-19: Secondary | ICD-10-CM | POA: Insufficient documentation

## 2020-10-20 DIAGNOSIS — D473 Essential (hemorrhagic) thrombocythemia: Secondary | ICD-10-CM | POA: Diagnosis not present

## 2020-10-20 DIAGNOSIS — I482 Chronic atrial fibrillation, unspecified: Secondary | ICD-10-CM | POA: Diagnosis not present

## 2020-10-20 DIAGNOSIS — Z01812 Encounter for preprocedural laboratory examination: Secondary | ICD-10-CM | POA: Insufficient documentation

## 2020-10-20 LAB — SARS CORONAVIRUS 2 (TAT 6-24 HRS): SARS Coronavirus 2: NEGATIVE

## 2020-10-22 ENCOUNTER — Ambulatory Visit (HOSPITAL_COMMUNITY): Payer: No Typology Code available for payment source | Admitting: Anesthesiology

## 2020-10-22 ENCOUNTER — Ambulatory Visit (HOSPITAL_COMMUNITY)
Admission: RE | Admit: 2020-10-22 | Discharge: 2020-10-22 | Disposition: A | Discharge status: Home / Self Care | Payer: No Typology Code available for payment source | Attending: Ophthalmology | Admitting: Ophthalmology

## 2020-10-22 ENCOUNTER — Encounter (HOSPITAL_COMMUNITY): Payer: Self-pay | Admitting: Ophthalmology

## 2020-10-22 ENCOUNTER — Encounter (HOSPITAL_COMMUNITY)
Admission: RE | Disposition: A | Discharge status: Home / Self Care | Payer: Self-pay | Source: Home / Self Care | Attending: Ophthalmology

## 2020-10-22 DIAGNOSIS — Z79899 Other long term (current) drug therapy: Secondary | ICD-10-CM | POA: Insufficient documentation

## 2020-10-22 DIAGNOSIS — H52223 Regular astigmatism, bilateral: Secondary | ICD-10-CM | POA: Insufficient documentation

## 2020-10-22 DIAGNOSIS — Z9842 Cataract extraction status, left eye: Secondary | ICD-10-CM | POA: Insufficient documentation

## 2020-10-22 DIAGNOSIS — I4891 Unspecified atrial fibrillation: Secondary | ICD-10-CM | POA: Diagnosis not present

## 2020-10-22 DIAGNOSIS — H25811 Combined forms of age-related cataract, right eye: Secondary | ICD-10-CM | POA: Insufficient documentation

## 2020-10-22 DIAGNOSIS — Z79890 Hormone replacement therapy (postmenopausal): Secondary | ICD-10-CM | POA: Diagnosis not present

## 2020-10-22 HISTORY — PX: CATARACT EXTRACTION W/PHACO: SHX586

## 2020-10-22 SURGERY — PHACOEMULSIFICATION, CATARACT, WITH IOL INSERTION
Anesthesia: Monitor Anesthesia Care | Site: Eye | Laterality: Right

## 2020-10-22 MED ORDER — PROVISC 10 MG/ML IO SOLN
INTRAOCULAR | Status: DC | PRN
  Administered 2020-10-22: 0.85 mL via INTRAOCULAR

## 2020-10-22 MED ORDER — LIDOCAINE HCL 3.5 % OP GEL: 1.0000 "application " | Freq: Once | OPHTHALMIC | Status: DC

## 2020-10-22 MED ORDER — POVIDONE-IODINE 5 % OP SOLN
OPHTHALMIC | Status: DC | PRN
  Administered 2020-10-22: 1 via OPHTHALMIC

## 2020-10-22 MED ORDER — CYCLOPENTOLATE-PHENYLEPHRINE 0.2-1 % OP SOLN: 1.0000 [drp] | OPHTHALMIC | Status: DC | PRN

## 2020-10-22 MED ORDER — PHENYLEPHRINE HCL 2.5 % OP SOLN
1.0000 [drp] | OPHTHALMIC | Status: AC | PRN
  Administered 2020-10-22 (×3): 1 [drp] via OPHTHALMIC

## 2020-10-22 MED ORDER — BSS IO SOLN
INTRAOCULAR | Status: DC | PRN
  Administered 2020-10-22: 15 mL via INTRAOCULAR

## 2020-10-22 MED ORDER — LIDOCAINE HCL (PF) 1 % IJ SOLN
INTRAOCULAR | Status: DC | PRN
  Administered 2020-10-22: 1 mL via OPHTHALMIC

## 2020-10-22 MED ORDER — SODIUM HYALURONATE 23 MG/ML IO SOLN
INTRAOCULAR | Status: DC | PRN
  Administered 2020-10-22: 0.6 mL via INTRAOCULAR

## 2020-10-22 MED ORDER — PHENYLEPHRINE-KETOROLAC 1-0.3 % IO SOLN
INTRAOCULAR | Status: AC
  Filled 2020-10-22: qty 4

## 2020-10-22 MED ORDER — NEOMYCIN-POLYMYXIN-DEXAMETH 3.5-10000-0.1 OP SUSP
OPHTHALMIC | Status: DC | PRN
  Administered 2020-10-22: 1 [drp] via OPHTHALMIC

## 2020-10-22 MED ORDER — STERILE WATER FOR IRRIGATION IR SOLN
Status: DC | PRN
  Administered 2020-10-22: 500 mL

## 2020-10-22 MED ORDER — TROPICAMIDE 1 % OP SOLN
1.0000 [drp] | OPHTHALMIC | Status: AC
  Administered 2020-10-22 (×3): 1 [drp] via OPHTHALMIC

## 2020-10-22 MED ORDER — TETRACAINE HCL 0.5 % OP SOLN
1.0000 [drp] | OPHTHALMIC | Status: AC | PRN
  Administered 2020-10-22 (×3): 1 [drp] via OPHTHALMIC

## 2020-10-22 MED ORDER — EPINEPHRINE PF 1 MG/ML IJ SOLN
INTRAMUSCULAR | Status: AC
  Filled 2020-10-22: qty 1

## 2020-10-22 MED ORDER — PHENYLEPHRINE-KETOROLAC 1-0.3 % IO SOLN
INTRAOCULAR | Status: DC | PRN
  Administered 2020-10-22: 500 mL via OPHTHALMIC

## 2020-10-22 SURGICAL SUPPLY — 15 items
CLOTH BEACON ORANGE TIMEOUT ST (SAFETY) ×1 IMPLANT
EYE SHIELD UNIVERSAL CLEAR (GAUZE/BANDAGES/DRESSINGS) ×1 IMPLANT
GLOVE SURG UNDER POLY LF SZ6.5 (GLOVE) ×1 IMPLANT
GLOVE SURG UNDER POLY LF SZ7 (GLOVE) ×1 IMPLANT
NDL HYPO 18GX1.5 BLUNT FILL (NEEDLE) IMPLANT
NEEDLE HYPO 18GX1.5 BLUNT FILL (NEEDLE) ×2 IMPLANT
PAD ARMBOARD 7.5X6 YLW CONV (MISCELLANEOUS) ×1 IMPLANT
PROC W SPEC LENS (INTRAOCULAR LENS)
PROCESS W SPEC LENS (INTRAOCULAR LENS) IMPLANT
SYR TB 1ML LL NO SAFETY (SYRINGE) ×1 IMPLANT
TAPE SURG TRANSPORE 1 IN (GAUZE/BANDAGES/DRESSINGS) IMPLANT
TAPE SURGICAL TRANSPORE 1 IN (GAUZE/BANDAGES/DRESSINGS) ×2
TECNIS TORIC II IOL (Intraocular Lens) ×1 IMPLANT
VISCOELASTIC ADDITIONAL (OPHTHALMIC RELATED) IMPLANT
WATER STERILE IRR 250ML POUR (IV SOLUTION) ×1 IMPLANT

## 2020-10-22 NOTE — Anesthesia Preprocedure Evaluation (Addendum)
Anesthesia Evaluation  Patient identified by MRN, date of birth, ID band Patient awake    Reviewed: Allergy & Precautions, NPO status , Patient's Chart, lab work & pertinent test results  History of Anesthesia Complications Negative for: history of anesthetic complications  Airway Mallampati: II  TM Distance: >3 FB Neck ROM: Full    Dental  (+) Dental Advisory Given, Upper Dentures, Lower Dentures   Pulmonary former smoker,    Pulmonary exam normal breath sounds clear to auscultation       Cardiovascular Exercise Tolerance: Good + dysrhythmias Atrial Fibrillation  Rhythm:Irregular Rate:Normal     Neuro/Psych  Neuromuscular disease (RLS) negative psych ROS   GI/Hepatic negative GI ROS, Neg liver ROS,   Endo/Other  Hypothyroidism   Renal/GU negative Renal ROS  negative genitourinary   Musculoskeletal negative musculoskeletal ROS (+)   Abdominal   Peds negative pediatric ROS (+)  Hematology negative hematology ROS (+)   Anesthesia Other Findings   Reproductive/Obstetrics negative OB ROS                            Anesthesia Physical  Anesthesia Plan  ASA: II  Anesthesia Plan: MAC   Post-op Pain Management:    Induction:   PONV Risk Score and Plan:   Airway Management Planned: Nasal Cannula and Natural Airway  Additional Equipment:   Intra-op Plan:   Post-operative Plan:   Informed Consent: I have reviewed the patients History and Physical, chart, labs and discussed the procedure including the risks, benefits and alternatives for the proposed anesthesia with the patient or authorized representative who has indicated his/her understanding and acceptance.       Plan Discussed with: CRNA and Surgeon  Anesthesia Plan Comments:         Anesthesia Quick Evaluation

## 2020-10-22 NOTE — Anesthesia Procedure Notes (Signed)
Procedure Name: MAC Date/Time: 10/22/2020 8:59 AM Performed by: Orlie Dakin, CRNA Pre-anesthesia Checklist: Emergency Drugs available, Patient identified, Suction available and Patient being monitored Patient Re-evaluated:Patient Re-evaluated prior to induction Oxygen Delivery Method: Nasal cannula Placement Confirmation: positive ETCO2

## 2020-10-22 NOTE — Anesthesia Postprocedure Evaluation (Signed)
Anesthesia Post Note  Patient: Alexander Maynard  Procedure(s) Performed: CATARACT EXTRACTION PHACO AND INTRAOCULAR LENS PLACEMENT ([place]) (Right Eye)  Patient location during evaluation: Phase II Anesthesia Type: MAC Level of consciousness: awake and alert and oriented Pain management: pain level controlled Vital Signs Assessment: post-procedure vital signs reviewed and stable Respiratory status: spontaneous breathing and respiratory function stable Cardiovascular status: blood pressure returned to baseline and stable Postop Assessment: no apparent nausea or vomiting Anesthetic complications: no   No complications documented.   Last Vitals:  Vitals:   10/22/20 0808 10/22/20 0922  BP: 135/87 128/84  Pulse: 61 71  Resp: 19 18  Temp: 36.4 C 36.7 C  SpO2: 100% 100%    Last Pain:  Vitals:   10/22/20 0922  TempSrc: Oral  PainSc: 0-No pain                 Bynum Mccullars C Tavaras Goody

## 2020-10-22 NOTE — Interval H&P Note (Signed)
History and Physical Interval Note:  10/22/2020 8:51 AM  Alexander Maynard  has presented today for surgery, with the diagnosis of Nuclear sclerotic cataract - Right eye.  The various methods of treatment have been discussed with the patient and family. After consideration of risks, benefits and other options for treatment, the patient has consented to  Procedure(s) with comments: CATARACT EXTRACTION PHACO AND INTRAOCULAR LENS PLACEMENT ([facility]) (Right) - right as a surgical intervention.  The patient's history has been reviewed, patient examined, no change in status, stable for surgery.  I have reviewed the patient's chart and labs.  Questions were answered to the patient's satisfaction.     Baruch Goldmann

## 2020-10-22 NOTE — Op Note (Signed)
Date of procedure: 10/22/20  Pre-operative diagnosis: Visually significant combined-form age-related cataract, Right Eye; Visually Significant Astigmatism, Right Eye (H25.811)  Post-operative diagnosis: Visually significant age-related cataract, Right Eye; Visually Significant Astigmatism, Right Eye  Procedure: Removal of cataract via phacoemulsification and insertion of intra-ocular lens Wynetta Emery and Johnson [id number] +17.0D into the capsular bag of the Right Eye  Attending surgeon: Gerda Diss. [place] Dohmen, MD, MA  Anesthesia: MAC, Topical [place]  Complications: None  Estimated Blood Loss: <49m (minimal)  Specimens: None  Implants: As above  Indications:  Visually significant age-related cataract, Right Eye; Visually Significant Astigmatism, Right Eye  Procedure:  The patient was seen and identified in the pre-operative area. The operative eye was identified and dilated.  The operative eye was marked.  Pre-operative toric markers were used to mark the eye at 0 and 180 degrees. Topical anesthesia was administered to the operative eye.     The patient was then to the operative suite and placed in the supine position.  A timeout was performed confirming the patient, procedure to be performed, and all other relevant information.   The patient's face was prepped and draped in the usual fashion for intra-ocular surgery.  A lid speculum was placed into the operative eye and the surgical microscope moved into place and focused.  A superotemporal paracentesis was created using a 20 gauge paracentesis blade.  Shugarcaine was injected into the anterior chamber.  Viscoelastic was injected into the anterior chamber.  A temporal clear-corneal main wound incision was created using a 2.470mmicrokeratome.  A continuous curvilinear capsulorrhexis was initiated using an irrigating cystitome and completed using capsulorrhexis forceps.  Hydrodissection and hydrodeliniation were performed.  Viscoelastic was injected into  the anterior chamber.  A phacoemulsification handpiece and a chopper as a second instrument were used to remove the nucleus and epinucleus. The irrigation/aspiration handpiece was used to remove any remaining cortical material.   The capsular bag was reinflated with viscoelastic, checked, and found to be intact.  The intraocular lens was inserted into the capsular bag and dialed into place using a Kuglen hook to ?? degrees.  The irrigation/aspiration handpiece was used to remove any remaining viscoelastic.  The clear corneal wound and paracentesis wounds were then hydrated and checked with Weck-Cels to be watertight.  The lid-speculum and drape was removed, and the patient's face was cleaned with a wet and dry 4x4.  Maxitrol was instilled in the eye before a clear shield was taped over the eye. The patient was taken to the post-operative care unit in good condition, having tolerated the procedure well.  Post-Op Instructions: [facility] and will receive all other orders and instructions.

## 2020-10-22 NOTE — Transfer of Care (Signed)
Immediate Anesthesia Transfer of Care Note  Patient: Alexander Maynard  Procedure(s) Performed: CATARACT EXTRACTION PHACO AND INTRAOCULAR LENS PLACEMENT ([facility]) (Right Eye)  Patient Location: Short Stay  Anesthesia Type:MAC  Level of Consciousness: awake, alert  and oriented  Airway & Oxygen Therapy: Patient Spontanous Breathing  Post-op Assessment: Report given to RN and Post -op Vital signs reviewed and stable  Post vital signs: Reviewed and stable  Last Vitals:  Vitals Value Taken Time  BP    Temp    Pulse    Resp    SpO2      Last Pain:  Vitals:   10/22/20 0808  TempSrc: Oral  PainSc: 0-No pain         Complications: No complications documented.

## 2020-10-22 NOTE — Discharge Instructions (Signed)
Please discharge patient when stable, will follow up today with Dr. Tawn Fitzner at the [facility] [place] office immediately following discharge.  Leave shield in place until visit.  All paperwork with discharge instructions will be given at the office.  [facility] [place] Address:  [address]  [place], [place] [address]  

## 2020-10-25 ENCOUNTER — Encounter (HOSPITAL_COMMUNITY): Payer: Self-pay | Admitting: Ophthalmology

## 2020-11-01 DIAGNOSIS — E1140 Type 2 diabetes mellitus with diabetic neuropathy, unspecified: Secondary | ICD-10-CM | POA: Diagnosis not present

## 2020-11-01 DIAGNOSIS — M79672 Pain in left foot: Secondary | ICD-10-CM | POA: Diagnosis not present

## 2020-11-01 DIAGNOSIS — L89892 Pressure ulcer of other site, stage 2: Secondary | ICD-10-CM | POA: Diagnosis not present

## 2020-11-01 DIAGNOSIS — E1151 Type 2 diabetes mellitus with diabetic peripheral angiopathy without gangrene: Secondary | ICD-10-CM | POA: Diagnosis not present

## 2020-11-29 DIAGNOSIS — E039 Hypothyroidism, unspecified: Secondary | ICD-10-CM | POA: Diagnosis not present

## 2020-12-20 DIAGNOSIS — D473 Essential (hemorrhagic) thrombocythemia: Secondary | ICD-10-CM | POA: Diagnosis not present

## 2020-12-20 DIAGNOSIS — I482 Chronic atrial fibrillation, unspecified: Secondary | ICD-10-CM | POA: Diagnosis not present

## 2020-12-20 DIAGNOSIS — K219 Gastro-esophageal reflux disease without esophagitis: Secondary | ICD-10-CM | POA: Diagnosis not present

## 2020-12-20 DIAGNOSIS — D751 Secondary polycythemia: Secondary | ICD-10-CM | POA: Diagnosis not present

## 2020-12-27 DIAGNOSIS — X32XXXD Exposure to sunlight, subsequent encounter: Secondary | ICD-10-CM | POA: Diagnosis not present

## 2020-12-27 DIAGNOSIS — L570 Actinic keratosis: Secondary | ICD-10-CM | POA: Diagnosis not present

## 2021-01-17 DIAGNOSIS — R5382 Chronic fatigue, unspecified: Secondary | ICD-10-CM | POA: Diagnosis not present

## 2021-01-17 DIAGNOSIS — E039 Hypothyroidism, unspecified: Secondary | ICD-10-CM | POA: Diagnosis not present

## 2021-01-20 DIAGNOSIS — K219 Gastro-esophageal reflux disease without esophagitis: Secondary | ICD-10-CM | POA: Diagnosis not present

## 2021-01-20 DIAGNOSIS — D751 Secondary polycythemia: Secondary | ICD-10-CM | POA: Diagnosis not present

## 2021-01-20 DIAGNOSIS — I482 Chronic atrial fibrillation, unspecified: Secondary | ICD-10-CM | POA: Diagnosis not present

## 2021-01-20 DIAGNOSIS — D473 Essential (hemorrhagic) thrombocythemia: Secondary | ICD-10-CM | POA: Diagnosis not present

## 2021-02-07 DIAGNOSIS — E039 Hypothyroidism, unspecified: Secondary | ICD-10-CM | POA: Diagnosis not present

## 2021-02-07 DIAGNOSIS — D751 Secondary polycythemia: Secondary | ICD-10-CM | POA: Diagnosis not present

## 2021-02-07 DIAGNOSIS — G6289 Other specified polyneuropathies: Secondary | ICD-10-CM | POA: Diagnosis not present

## 2021-02-07 DIAGNOSIS — D473 Essential (hemorrhagic) thrombocythemia: Secondary | ICD-10-CM | POA: Diagnosis not present

## 2021-02-20 DIAGNOSIS — K219 Gastro-esophageal reflux disease without esophagitis: Secondary | ICD-10-CM | POA: Diagnosis not present

## 2021-02-20 DIAGNOSIS — D473 Essential (hemorrhagic) thrombocythemia: Secondary | ICD-10-CM | POA: Diagnosis not present

## 2021-02-20 DIAGNOSIS — D751 Secondary polycythemia: Secondary | ICD-10-CM | POA: Diagnosis not present

## 2021-02-20 DIAGNOSIS — I482 Chronic atrial fibrillation, unspecified: Secondary | ICD-10-CM | POA: Diagnosis not present

## 2021-02-21 DIAGNOSIS — Z7189 Other specified counseling: Secondary | ICD-10-CM | POA: Diagnosis not present

## 2021-02-21 DIAGNOSIS — D473 Essential (hemorrhagic) thrombocythemia: Secondary | ICD-10-CM | POA: Diagnosis not present

## 2021-02-21 DIAGNOSIS — D751 Secondary polycythemia: Secondary | ICD-10-CM | POA: Diagnosis not present

## 2021-02-21 DIAGNOSIS — E039 Hypothyroidism, unspecified: Secondary | ICD-10-CM | POA: Diagnosis not present

## 2021-02-21 DIAGNOSIS — I4891 Unspecified atrial fibrillation: Secondary | ICD-10-CM | POA: Diagnosis not present

## 2021-02-21 DIAGNOSIS — E7849 Other hyperlipidemia: Secondary | ICD-10-CM | POA: Diagnosis not present

## 2021-06-23 ENCOUNTER — Other Ambulatory Visit: Payer: Medicare Other

## 2021-06-23 ENCOUNTER — Encounter: Payer: Self-pay | Admitting: Cardiology

## 2021-06-27 DIAGNOSIS — X32XXXD Exposure to sunlight, subsequent encounter: Secondary | ICD-10-CM | POA: Diagnosis not present

## 2021-06-27 DIAGNOSIS — L570 Actinic keratosis: Secondary | ICD-10-CM | POA: Diagnosis not present

## 2021-06-27 DIAGNOSIS — C44319 Basal cell carcinoma of skin of other parts of face: Secondary | ICD-10-CM | POA: Diagnosis not present

## 2021-07-27 ENCOUNTER — Encounter: Payer: Self-pay | Admitting: Cardiology

## 2021-08-09 DIAGNOSIS — M79672 Pain in left foot: Secondary | ICD-10-CM | POA: Diagnosis not present

## 2021-08-09 DIAGNOSIS — R2242 Localized swelling, mass and lump, left lower limb: Secondary | ICD-10-CM | POA: Diagnosis not present

## 2021-08-30 DIAGNOSIS — I480 Paroxysmal atrial fibrillation: Secondary | ICD-10-CM | POA: Diagnosis not present

## 2021-09-13 DIAGNOSIS — G2581 Restless legs syndrome: Secondary | ICD-10-CM | POA: Diagnosis not present

## 2021-09-13 DIAGNOSIS — D649 Anemia, unspecified: Secondary | ICD-10-CM | POA: Diagnosis not present

## 2021-09-13 DIAGNOSIS — E039 Hypothyroidism, unspecified: Secondary | ICD-10-CM | POA: Diagnosis not present

## 2021-09-13 DIAGNOSIS — D473 Essential (hemorrhagic) thrombocythemia: Secondary | ICD-10-CM | POA: Diagnosis not present

## 2021-09-13 DIAGNOSIS — D751 Secondary polycythemia: Secondary | ICD-10-CM | POA: Diagnosis not present

## 2021-09-13 DIAGNOSIS — Z0001 Encounter for general adult medical examination with abnormal findings: Secondary | ICD-10-CM | POA: Diagnosis not present

## 2021-09-13 DIAGNOSIS — I4891 Unspecified atrial fibrillation: Secondary | ICD-10-CM | POA: Diagnosis not present

## 2021-09-13 DIAGNOSIS — E782 Mixed hyperlipidemia: Secondary | ICD-10-CM | POA: Diagnosis not present

## 2021-09-19 DIAGNOSIS — D649 Anemia, unspecified: Secondary | ICD-10-CM | POA: Diagnosis not present

## 2021-09-19 DIAGNOSIS — Z0001 Encounter for general adult medical examination with abnormal findings: Secondary | ICD-10-CM | POA: Diagnosis not present

## 2021-09-19 DIAGNOSIS — F17210 Nicotine dependence, cigarettes, uncomplicated: Secondary | ICD-10-CM | POA: Diagnosis not present

## 2021-09-19 DIAGNOSIS — I4891 Unspecified atrial fibrillation: Secondary | ICD-10-CM | POA: Diagnosis not present

## 2021-09-19 DIAGNOSIS — E7849 Other hyperlipidemia: Secondary | ICD-10-CM | POA: Diagnosis not present

## 2021-09-19 DIAGNOSIS — E039 Hypothyroidism, unspecified: Secondary | ICD-10-CM | POA: Diagnosis not present

## 2021-09-19 DIAGNOSIS — I739 Peripheral vascular disease, unspecified: Secondary | ICD-10-CM | POA: Diagnosis not present

## 2021-09-19 DIAGNOSIS — I7789 Other specified disorders of arteries and arterioles: Secondary | ICD-10-CM | POA: Diagnosis not present

## 2021-09-20 DIAGNOSIS — I471 Supraventricular tachycardia: Secondary | ICD-10-CM | POA: Diagnosis not present

## 2021-09-20 DIAGNOSIS — I444 Left anterior fascicular block: Secondary | ICD-10-CM | POA: Diagnosis not present

## 2021-09-20 DIAGNOSIS — I493 Ventricular premature depolarization: Secondary | ICD-10-CM | POA: Diagnosis not present

## 2021-09-20 DIAGNOSIS — I491 Atrial premature depolarization: Secondary | ICD-10-CM | POA: Diagnosis not present

## 2021-09-20 DIAGNOSIS — I480 Paroxysmal atrial fibrillation: Secondary | ICD-10-CM | POA: Diagnosis not present

## 2021-09-20 DIAGNOSIS — R9431 Abnormal electrocardiogram [ECG] [EKG]: Secondary | ICD-10-CM | POA: Diagnosis not present

## 2021-10-12 DIAGNOSIS — E7849 Other hyperlipidemia: Secondary | ICD-10-CM | POA: Diagnosis not present

## 2021-10-12 DIAGNOSIS — D473 Essential (hemorrhagic) thrombocythemia: Secondary | ICD-10-CM | POA: Diagnosis not present

## 2021-10-12 DIAGNOSIS — G2581 Restless legs syndrome: Secondary | ICD-10-CM | POA: Diagnosis not present

## 2021-10-12 DIAGNOSIS — E039 Hypothyroidism, unspecified: Secondary | ICD-10-CM | POA: Diagnosis not present

## 2021-10-12 DIAGNOSIS — D751 Secondary polycythemia: Secondary | ICD-10-CM | POA: Diagnosis not present

## 2021-10-12 DIAGNOSIS — I4891 Unspecified atrial fibrillation: Secondary | ICD-10-CM | POA: Diagnosis not present

## 2021-10-18 ENCOUNTER — Encounter: Payer: Self-pay | Admitting: Urology

## 2021-10-18 ENCOUNTER — Other Ambulatory Visit: Payer: Self-pay

## 2021-10-18 ENCOUNTER — Ambulatory Visit (INDEPENDENT_AMBULATORY_CARE_PROVIDER_SITE_OTHER): Payer: Medicare Other | Admitting: Urology

## 2021-10-18 VITALS — BP 129/61 | HR 54 | Ht 71.0 in | Wt 200.0 lb

## 2021-10-18 DIAGNOSIS — N401 Enlarged prostate with lower urinary tract symptoms: Secondary | ICD-10-CM

## 2021-10-18 DIAGNOSIS — R351 Nocturia: Secondary | ICD-10-CM

## 2021-10-18 DIAGNOSIS — N138 Other obstructive and reflux uropathy: Secondary | ICD-10-CM | POA: Insufficient documentation

## 2021-10-18 LAB — BLADDER SCAN AMB NON-IMAGING: Scan Result: 45

## 2021-10-18 NOTE — Progress Notes (Signed)
? ?Assessment: ?1. BPH with obstruction/lower urinary tract symptoms   ?2. Nocturia   ? ?Plan: ?Possible causes of nocturia discussed with the patient including BPH, overactive bladder, increase fluid intake, sleep disturbances, postural changes, and hormonal imbalance. ?24 hour voiding diary to evaluate nocturia ?Will discuss management options after review of 24 hour diary. ?Continue tamsulosin ? ?Chief Complaint:  ?Chief Complaint  ?Patient presents with  ? Benign Prostatic Hypertrophy  ? ? ?History of Present Illness: ? ?Alexander Maynard is a 76 y.o. year old male who is seen in consultation from Caryl Bis, MD for evaluation of BPH with obstruction and nocturia.  He has had lower urinary tract symptoms for several years and has been managed with tamsulosin.  He has noted increasing nocturia for the past 1-2 years.  He voids approximately every 60-90 minutes through the night.  No daytime frequency.  He reports a good force of stream.  No dysuria or gross hematuria.  He reports large volume voids during the night.  No history of UTIs or prostatitis.  He is not aware of trying any medications for his nocturia. ? ?He does have a history of erectile dysfunction.  He has been given a prescription for daily tadalafil but has not started the medication. ? ? ?Past Medical History:  ?Past Medical History:  ?Diagnosis Date  ? Enlarged prostate without lower urinary tract symptoms (luts)   ? Essential thrombocythemia ([place])   ? Hyperlipidemia   ? Hypothyroidism   ? Male erectile disorder   ? Other specified polyneuropathies   ? Polycythemia, secondary   ? Restless leg syndrome   ? Venous thrombosis   ? There is a mention of this in his notes.  He is on chronic anticoagulation.  ? ? ?Past Surgical History:  ?Past Surgical History:  ?Procedure Laterality Date  ? AMPUTATION OF SECOND TOE    ? CATARACT EXTRACTION Left 09/2020  ? CATARACT EXTRACTION W/PHACO Left 10/08/2020  ? Procedure: CATARACT EXTRACTION PHACO AND  INTRAOCULAR LENS PLACEMENT LEFT EYE;  Surgeon: Baruch Goldmann, MD;  Location: [facility];  Service: Ophthalmology;  Laterality: Left;  CDE 23.63  ? CATARACT EXTRACTION W/PHACO Right 10/22/2020  ? Procedure: CATARACT EXTRACTION PHACO AND INTRAOCULAR LENS PLACEMENT ([facility]);  Surgeon: Baruch Goldmann, MD;  Location: [facility];  Service: Ophthalmology;  Laterality: Right;  CDE: 16.83 ?  ? COLONOSCOPY  2009  ? [place]  ? LARYNGEAL POLYP    ? TONSILLECTOMY    ? TRANSURETHRAL RESECTION OF PROSTATE  2016  ? UPPER GASTROINTESTINAL ENDOSCOPY    ? ? ?Allergies:  ?No Known Allergies ? ?Family History:  ?Family History  ?Problem Relation Age of Onset  ? Pneumonia Mother   ? Heart attack Father 48  ? Prostate cancer Brother   ? Chronic Renal Failure Brother   ? ? ?Social History:  ?Social History  ? ?Tobacco Use  ? Smoking status: Former  ?  Types: Cigarettes  ? Smokeless tobacco: Never  ?Vaping Use  ? Vaping Use: Never used  ?Substance Use Topics  ? Alcohol use: Never  ? Drug use: Never  ? ? ?Review of symptoms:  ?Constitutional:  Negative for unexplained weight loss, night sweats, fever, chills ?ENT:  Negative for nose bleeds, sinus pain, painful swallowing ?CV:  Negative for chest pain, shortness of breath, exercise intolerance, palpitations, loss of consciousness ?Resp:  Negative for cough, wheezing, shortness of breath ?GI:  Negative for nausea, vomiting, diarrhea, bloody stools ?GU:  Positives noted  in HPI; otherwise negative for gross hematuria, dysuria, urinary incontinence ?Neuro:  Negative for seizures, poor balance, limb weakness, slurred speech ?Psych:  Negative for lack of energy, depression, anxiety ?Endocrine:  Negative for polydipsia, polyuria, symptoms of hypoglycemia (dizziness, hunger, sweating) ?Hematologic:  Negative for anemia, purpura, petechia, prolonged or excessive bleeding, use of anticoagulants  ?Allergic:  Negative for difficulty breathing or choking as a result of exposure to anything; no  shellfish allergy; no allergic response (rash/itch) to materials, foods ? ?Physical exam: ?BP 129/61   Pulse (!) 54   Ht '5\' 11"'$  (1.803 m)   Wt 200 lb (90.7 kg)   BMI 27.89 kg/m?  ?GENERAL APPEARANCE:  Well appearing, well developed, well nourished, NAD ?HEENT: Atraumatic, Normocephalic, oropharynx clear. ?NECK: Supple without lymphadenopathy or thyromegaly. ?LUNGS: Clear to auscultation bilaterally. ?HEART: Regular Rate and Rhythm without murmurs, gallops, or rubs. ?ABDOMEN: Soft, non-tender, No Masses. ?EXTREMITIES: Moves all extremities well.  Without clubbing, cyanosis, or edema. ?NEUROLOGIC:  Alert and oriented x 3, normal gait, CN II-XII grossly intact.  ?MENTAL STATUS:  Appropriate. ?BACK:  Non-tender to palpation.  No CVAT ?SKIN:  Warm, dry and intact.   ?GU: ?Penis:  uncircumcised ?Meatus: Normal ?Scrotum: normal, no masses ?Testis: normal without masses bilateral ?Epididymis: normal ?Prostate: 50 g, NT, no nodules ?Rectum: Normal tone,  no masses or tenderness ? ? ?Results: ?U/A dipstick negative ?  ?Results for orders placed or performed in visit on 10/18/21 (from the past 24 hour(s))  ?BLADDER SCAN AMB NON-IMAGING  ? Collection Time: 10/18/21  2:41 PM  ?Result Value Ref Range  ? Scan Result 45   ? ? ?

## 2021-10-19 LAB — URINALYSIS, ROUTINE W REFLEX MICROSCOPIC
Bilirubin, UA: NEGATIVE
Glucose, UA: NEGATIVE
Ketones, UA: NEGATIVE
Leukocytes,UA: NEGATIVE
Nitrite, UA: NEGATIVE
Protein,UA: NEGATIVE
RBC, UA: NEGATIVE
Specific Gravity, UA: 1.02 (ref 1.005–1.030)
Urobilinogen, Ur: 0.2 mg/dL (ref 0.2–1.0)
pH, UA: 6.5 (ref 5.0–7.5)

## 2021-10-24 DIAGNOSIS — D0439 Carcinoma in situ of skin of other parts of face: Secondary | ICD-10-CM | POA: Diagnosis not present

## 2021-10-24 DIAGNOSIS — X32XXXD Exposure to sunlight, subsequent encounter: Secondary | ICD-10-CM | POA: Diagnosis not present

## 2021-10-24 DIAGNOSIS — Z85828 Personal history of other malignant neoplasm of skin: Secondary | ICD-10-CM | POA: Diagnosis not present

## 2021-10-24 DIAGNOSIS — Z08 Encounter for follow-up examination after completed treatment for malignant neoplasm: Secondary | ICD-10-CM | POA: Diagnosis not present

## 2021-10-24 DIAGNOSIS — L570 Actinic keratosis: Secondary | ICD-10-CM | POA: Diagnosis not present

## 2021-10-24 DIAGNOSIS — C44319 Basal cell carcinoma of skin of other parts of face: Secondary | ICD-10-CM | POA: Diagnosis not present

## 2022-02-08 DIAGNOSIS — E782 Mixed hyperlipidemia: Secondary | ICD-10-CM | POA: Diagnosis not present

## 2022-02-08 DIAGNOSIS — K219 Gastro-esophageal reflux disease without esophagitis: Secondary | ICD-10-CM | POA: Diagnosis not present

## 2022-02-08 DIAGNOSIS — E875 Hyperkalemia: Secondary | ICD-10-CM | POA: Diagnosis not present

## 2022-02-08 DIAGNOSIS — E7849 Other hyperlipidemia: Secondary | ICD-10-CM | POA: Diagnosis not present

## 2022-02-27 DIAGNOSIS — I493 Ventricular premature depolarization: Secondary | ICD-10-CM | POA: Diagnosis not present

## 2022-02-27 DIAGNOSIS — G4733 Obstructive sleep apnea (adult) (pediatric): Secondary | ICD-10-CM | POA: Diagnosis not present

## 2022-02-27 DIAGNOSIS — I480 Paroxysmal atrial fibrillation: Secondary | ICD-10-CM | POA: Diagnosis not present

## 2022-03-09 DIAGNOSIS — I493 Ventricular premature depolarization: Secondary | ICD-10-CM | POA: Diagnosis not present

## 2022-03-09 DIAGNOSIS — I480 Paroxysmal atrial fibrillation: Secondary | ICD-10-CM | POA: Diagnosis not present

## 2022-03-09 DIAGNOSIS — G4733 Obstructive sleep apnea (adult) (pediatric): Secondary | ICD-10-CM | POA: Diagnosis not present

## 2022-03-10 DIAGNOSIS — I517 Cardiomegaly: Secondary | ICD-10-CM | POA: Diagnosis not present

## 2022-03-10 DIAGNOSIS — R079 Chest pain, unspecified: Secondary | ICD-10-CM | POA: Diagnosis not present

## 2022-03-10 DIAGNOSIS — I209 Angina pectoris, unspecified: Secondary | ICD-10-CM | POA: Diagnosis not present

## 2022-03-10 DIAGNOSIS — I493 Ventricular premature depolarization: Secondary | ICD-10-CM | POA: Diagnosis not present

## 2022-03-10 DIAGNOSIS — I480 Paroxysmal atrial fibrillation: Secondary | ICD-10-CM | POA: Diagnosis not present

## 2022-03-17 DIAGNOSIS — K219 Gastro-esophageal reflux disease without esophagitis: Secondary | ICD-10-CM | POA: Diagnosis not present

## 2022-03-17 DIAGNOSIS — E7849 Other hyperlipidemia: Secondary | ICD-10-CM | POA: Diagnosis not present

## 2022-03-17 DIAGNOSIS — D473 Essential (hemorrhagic) thrombocythemia: Secondary | ICD-10-CM | POA: Diagnosis not present

## 2022-03-25 DIAGNOSIS — I4891 Unspecified atrial fibrillation: Secondary | ICD-10-CM | POA: Diagnosis not present

## 2022-03-25 DIAGNOSIS — E7849 Other hyperlipidemia: Secondary | ICD-10-CM | POA: Diagnosis not present

## 2022-03-25 DIAGNOSIS — G2581 Restless legs syndrome: Secondary | ICD-10-CM | POA: Diagnosis not present

## 2022-03-25 DIAGNOSIS — G6289 Other specified polyneuropathies: Secondary | ICD-10-CM | POA: Diagnosis not present

## 2022-03-25 DIAGNOSIS — D473 Essential (hemorrhagic) thrombocythemia: Secondary | ICD-10-CM | POA: Diagnosis not present

## 2022-03-25 DIAGNOSIS — E039 Hypothyroidism, unspecified: Secondary | ICD-10-CM | POA: Diagnosis not present

## 2022-03-25 DIAGNOSIS — D751 Secondary polycythemia: Secondary | ICD-10-CM | POA: Diagnosis not present

## 2022-05-15 ENCOUNTER — Encounter: Payer: Self-pay | Admitting: Cardiology

## 2022-05-15 ENCOUNTER — Ambulatory Visit: Payer: No Typology Code available for payment source | Attending: Cardiology | Admitting: Cardiology

## 2022-05-15 ENCOUNTER — Telehealth: Payer: Self-pay | Admitting: Cardiology

## 2022-05-15 VITALS — BP 110/66 | HR 73 | Ht 71.0 in | Wt 208.2 lb

## 2022-05-15 DIAGNOSIS — I480 Paroxysmal atrial fibrillation: Secondary | ICD-10-CM

## 2022-05-15 DIAGNOSIS — I493 Ventricular premature depolarization: Secondary | ICD-10-CM | POA: Diagnosis not present

## 2022-05-15 DIAGNOSIS — D6869 Other thrombophilia: Secondary | ICD-10-CM

## 2022-05-15 MED ORDER — METOPROLOL SUCCINATE ER 25 MG PO TB24: 25.0000 mg | ORAL_TABLET | Freq: Every day | ORAL | 3 refills | Status: DC

## 2022-05-15 MED ORDER — FLECAINIDE ACETATE 50 MG PO TABS: 50.0000 mg | ORAL_TABLET | Freq: Two times a day (BID) | ORAL | 3 refills | Status: DC

## 2022-05-15 NOTE — Patient Instructions (Addendum)
Medication Instructions:  Your physician has recommended you make the following change in your medication:  START Flecainide 50 mg twice daily START Metoprolol Succinate (Toprol) 25 mg once daily at bedtime  *If you need a refill on your cardiac medications before your next appointment, please call your pharmacy*   Lab Work: None ordered   Testing/Procedures: None ordered   Follow-Up: At [facility], you and your health needs are our priority.  As part of our continuing mission to provide you with exceptional heart care, we have created designated Provider Care Teams.  These Care Teams include your primary Cardiologist (physician) and Advanced Practice Providers (APPs -  Physician Assistants and Nurse Practitioners) who all work together to provide you with the care you need, when you need it.  We recommend signing up for the patient portal called "MyChart".  Sign up information is provided on this After Visit Summary.  MyChart is used to connect with patients for Virtual Visits (Telemedicine).  Patients are able to view lab/test results, encounter notes, upcoming appointments, etc.  Non-urgent messages can be sent to your provider as well.   To learn more about what you can do with MyChart, go to [email or web address].    Your next appointment:   1 month(s)  The format for your next appointment:   In Person  Provider:   You will follow up in the [facility] located at [facility] Your provider will be: Roderic Palau, NP or Clint R. Fenton, PA-C    Thank you for choosing [facility]!!   Trinidad Curet, RN [phone]  Other Instructions  Flecainide Tablets What is this medication? FLECAINIDE (FLEK a nide) prevents and treats a fast or irregular heartbeat (arrhythmia). It is often used to treat a type of arrhythmia known as AFib (atrial fibrillation). It works by slowing down overactive electric signals in the heart, which stabilizes your  heart rhythm. It belongs to a group of medications called antiarrhythmics. This medicine may be used for other purposes; ask your health care provider or pharmacist if you have questions. COMMON BRAND NAME(S): Tambocor What should I tell my care team before I take this medication? They need to know if you have any of these conditions: Abnormal levels of potassium in the blood Heart disease including heart rhythm and heart rate problems Kidney or liver disease Recent heart attack An unusual or allergic reaction to flecainide, local anesthetics, other medications, foods, dyes, or preservatives Pregnant or trying to get pregnant Breast-feeding How should I use this medication? Take this medication by mouth with a glass of water. Follow the directions on the prescription label. You can take this medication with or without food. Take your doses at regular intervals. Do not take your medication more often than directed. Do not stop taking this medication suddenly. This may cause serious, heart-related side effects. If your care team wants you to stop the medication, the dose may be slowly lowered over time to avoid any side effects. Talk to your care team regarding the use of this medication in children. While this medication may be prescribed for children as young as 1 year of age for selected conditions, precautions do apply. Overdosage: If you think you have taken too much of this medicine contact a poison control center or emergency room at once. NOTE: This medicine is only for you. Do not share this medicine with others. What if I miss a dose? If you miss a dose, take it as soon as you can.  If it is almost time for your next dose, take only that dose. Do not take double or extra doses. What may interact with this medication? Do not take this medication with any of the following: Amoxapine Arsenic trioxide Certain antibiotics like clarithromycin, erythromycin, gatifloxacin, gemifloxacin,  levofloxacin, moxifloxacin, sparfloxacin, or troleandomycin Certain antidepressants called tricyclic antidepressants like amitriptyline, imipramine, or nortriptyline Certain medications to control heart rhythm like disopyramide, encainide, moricizine, procainamide, propafenone, and quinidine Cisapride Delavirdine Droperidol Haloperidol [place] Imatinib Levomethadyl Maprotiline Medications for malaria like chloroquine and halofantrine Pentamidine Phenothiazines like chlorpromazine, mesoridazine, prochlorperazine, thioridazine Pimozide Quinine Ranolazine Ritonavir Sertindole This medication may also interact with the following: Cimetidine Dofetilide Medications for angina or high blood pressure Medications to control heart rhythm like amiodarone and digoxin Ziprasidone This list may not describe all possible interactions. Give your health care provider a list of all the medicines, herbs, non-prescription drugs, or dietary supplements you use. Also tell them if you smoke, drink alcohol, or use illegal drugs. Some items may interact with your medicine. What should I watch for while using this medication? Visit your care team for regular checks on your progress. Because your condition and the use of this medication carries some risk, it is a good idea to carry an identification card, necklace or bracelet with details of your condition, medications, and care team. Check your blood pressure and pulse rate regularly. Ask your care team what your blood pressure and pulse rate should be, and when you should contact them. Your care team also may schedule regular blood tests and electrocardiograms to check your progress. You may get drowsy or dizzy. Do not drive, use machinery, or do anything that needs mental alertness until you know how this medication affects you. Do not stand or sit up quickly, especially if you are an older patient. This reduces the risk of dizzy or fainting spells. Alcohol  can make you more dizzy, increase flushing and rapid heartbeats. Avoid alcoholic drinks. What side effects may I notice from receiving this medication? Side effects that you should report to your care team as soon as possible: Allergic reactions--skin rash, itching, hives, swelling of the face, lips, tongue, or throat Heart failure--shortness of breath, swelling of the ankles, feet, or hands, sudden weight gain, unusual weakness or fatigue Heart rhythm changes--fast or irregular heartbeat, dizziness, feeling faint or lightheaded, chest pain, trouble breathing Liver injury--right upper belly pain, loss of appetite, nausea, light-colored stool, dark yellow or brown urine, yellowing skin or eyes, unusual weakness or fatigue Side effects that usually do not require medical attention (report to your care team if they continue or are bothersome): Blurry vision Constipation Dizziness Fatigue Headache Nausea Tremors or shaking This list may not describe all possible side effects. Call your doctor for medical advice about side effects. You may report side effects to FDA at 1-800-FDA-1088. Where should I keep my medication? Keep out of the reach of children and pets. Store at room temperature between 15 and 30 degrees C (59 and 86 degrees F). Protect from light. Keep container tightly closed. Throw away any unused medication after the expiration date. NOTE: This sheet is a summary. It may not cover all possible information. If you have questions about this medicine, talk to your doctor, pharmacist, or health care provider.  2023 Elsevier/Gold Standard (2007-08-31 00:00:00)    Metoprolol Extended-Release Tablets What is this medication? METOPROLOL (me TOE proe lole) treats high blood pressure and heart failure. It may also be used to prevent chest pain (angina). It  works by lowering your blood pressure and heart rate, making it easier for your heart to pump blood to the rest of your body. It belongs  to a group of medications called beta blockers. This medicine may be used for other purposes; ask your health care provider or pharmacist if you have questions. COMMON BRAND NAME(S): toprol, Toprol XL What should I tell my care team before I take this medication? They need to know if you have any of these conditions: Diabetes Heart or vessel disease like slow heart rate, worsening heart failure, heart block, sick sinus syndrome, or Raynaud's disease Kidney disease Liver disease Lung or breathing disease, like asthma or emphysema Pheochromocytoma Thyroid disease An unusual or allergic reaction to metoprolol, other beta blockers, medications, foods, dyes, or preservatives Pregnant or trying to get pregnant Breast-feeding How should I use this medication? Take this medication by mouth. Take it as directed on the prescription label at the same time every day. Take it with food. You may cut the tablet in half if it is scored (has a line in the middle of it). This may help you swallow the tablet if the whole tablet is too big. Be sure to take both halves. Do not take just one-half of the tablet. Keep taking it unless your care team tells you to stop. Talk to your care team about the use of this medication in children. While it may be prescribed for children as young as 6 years for selected conditions, precautions do apply. Overdosage: If you think you have taken too much of this medicine contact a poison control center or emergency room at once. NOTE: This medicine is only for you. Do not share this medicine with others. What if I miss a dose? If you miss a dose, take it as soon as you can. If it is almost time for your next dose, take only that dose. Do not take double or extra doses. What may interact with this medication? This medication may interact with the following: Certain medications for blood pressure, heart disease, irregular heartbeat Certain medications for depression, like monoamine  oxidase (MAO) inhibitors, fluoxetine, or paroxetine Clonidine Dobutamine Epinephrine Isoproterenol Reserpine This list may not describe all possible interactions. Give your health care provider a list of all the medicines, herbs, non-prescription drugs, or dietary supplements you use. Also tell them if you smoke, drink alcohol, or use illegal drugs. Some items may interact with your medicine. What should I watch for while using this medication? Visit your care team for regular checks on your progress. Check your blood pressure as directed. Ask your care team what your blood pressure should be. Also, find out when you should contact them. Do not treat yourself for coughs, colds, or pain while you are using this medication without asking your care team for advice. Some medications may increase your blood pressure. You may get drowsy or dizzy. Do not drive, use machinery, or do anything that needs mental alertness until you know how this medication affects you. Do not stand up or sit up quickly, especially if you are an older patient. This reduces the risk of dizzy or fainting spells. Alcohol may interfere with the effect of this medication. Avoid alcoholic drinks. This medication may increase blood sugar. Ask your care team if changes in diet or medications are needed if you have diabetes. What side effects may I notice from receiving this medication? Side effects that you should report to your care team as soon as possible: Allergic reactions--skin  rash, itching, hives, swelling of the face, lips, tongue, or throat Heart failure--shortness of breath, swelling of the ankles, feet, or hands, sudden weight gain, unusual weakness or fatigue Low blood pressure--dizziness, feeling faint or lightheaded, blurry vision Raynaud's--cool, numb, or painful fingers or toes that may change color from pale, to blue, to red Slow heartbeat--dizziness, feeling faint or lightheaded, confusion, trouble breathing,  unusual weakness or fatigue Worsening mood, feelings of depression Side effects that usually do not require medical attention (report to your care team if they continue or are bothersome): Change in sex drive or performance Diarrhea Dizziness Fatigue Headache This list may not describe all possible side effects. Call your doctor for medical advice about side effects. You may report side effects to FDA at 1-800-FDA-1088. Where should I keep my medication? Keep out of the reach of children and pets. Store at room temperature between 20 and 25 degrees C (68 and 77 degrees F). Throw away any unused medication after the expiration date. NOTE: This sheet is a summary. It may not cover all possible information. If you have questions about this medicine, talk to your doctor, pharmacist, or health care provider.  2023 Elsevier/Gold Standard (2020-09-03 00:00:00)   Important Information About Sugar

## 2022-05-15 NOTE — Progress Notes (Signed)
Electrophysiology Office Note   Date:  05/15/2022   ID:  Alexander Maynard, DOB 1945-08-02, MRN [id number]  PCP:  Caryl Bis, MD  Cardiologist:  Cathie Hoops, [facility] Primary Electrophysiologist:  Kian Ottaviano Meredith Leeds, MD    Chief Complaint: AF   History of Present Illness: Alexander Maynard is a 76 y.o. male who is being seen today for the evaluation of AF at the request of [name], [name], DO. Presenting today for electrophysiology evaluation.  He has a history seen for atrial fibrillation, thyroid disease, unprovoked DVT.  He is on lifelong Xarelto.  He is a former smoker, quit smoking in 1993.  Atrial fibrillation was diagnosed January 2022 with an ECG.  Echo showed a normal ejection fraction with mild to moderate aortic regurgitation and a dilated aortic root.  Mentally, he feels fatigued and short of breath.  He states that most the time he feels well.  He states that his atrial fibrillation was found incidentally.  He states that his wife made him an appointment with cardiology and it was found at that time.  Today, he denies symptoms of palpitations, chest pain, shortness of breath, orthopnea, PND, lower extremity edema, claudication, dizziness, presyncope, syncope, bleeding, or neurologic sequela. The patient is tolerating medications without difficulties.    Past Medical History:  Diagnosis Date   Enlarged prostate without lower urinary tract symptoms (luts)    Essential thrombocythemia ([place])    Hyperlipidemia    Hypothyroidism    Male erectile disorder    Other specified polyneuropathies    Polycythemia, secondary    Restless leg syndrome    Venous thrombosis    There is a mention of this in his notes.  He is on chronic anticoagulation.   Past Surgical History:  Procedure Laterality Date   AMPUTATION OF SECOND TOE     CATARACT EXTRACTION Left 09/2020   CATARACT EXTRACTION W/PHACO Left 10/08/2020   Procedure: CATARACT EXTRACTION PHACO AND INTRAOCULAR LENS  PLACEMENT LEFT EYE;  Surgeon: Baruch Goldmann, MD;  Location: [facility];  Service: Ophthalmology;  Laterality: Left;  CDE 23.63   CATARACT EXTRACTION W/PHACO Right 10/22/2020   Procedure: CATARACT EXTRACTION PHACO AND INTRAOCULAR LENS PLACEMENT (IOC);  Surgeon: Baruch Goldmann, MD;  Location: [facility];  Service: Ophthalmology;  Laterality: Right;  CDE: 16.83    COLONOSCOPY  2009   INGUINAL HERNIA REPAIR  1998   LARYNGEAL POLYP     TONSILLECTOMY     TRANSURETHRAL RESECTION OF PROSTATE  2016   UPPER GASTROINTESTINAL ENDOSCOPY       Current Outpatient Medications  Medication Sig Dispense Refill   aspirin 81 MG EC tablet Take 81 mg by mouth daily.     flecainide (TAMBOCOR) 50 MG tablet Take 1 tablet (50 mg total) by mouth 2 (two) times daily. 60 tablet 3   gabapentin (NEURONTIN) 300 MG capsule Take 300 mg by mouth 2 (two) times daily.     hydroxyurea (HYDREA) 500 MG capsule Take 1,000 mg by mouth daily. May take with food to minimize GI side effects.     levothyroxine (SYNTHROID) 88 MCG tablet Take 88 mcg by mouth daily before breakfast.     metoprolol succinate (TOPROL-XL) 25 MG 24 hr tablet Take 1 tablet (25 mg total) by mouth at bedtime. Take with or immediately following a meal. 30 tablet 3   Multiple Vitamin (MULTIVITAMIN WITH MINERALS) TABS tablet Take 1 tablet by mouth daily.     omeprazole (PRILOSEC) 20 MG capsule Take 20 mg  by mouth daily.     Rivaroxaban (XARELTO PO) Take by mouth.     tamsulosin (FLOMAX) 0.4 MG CAPS capsule Take 0.4 mg by mouth in the morning and at bedtime.     warfarin (COUMADIN) 5 MG tablet Take 7.5 mg by mouth daily. AS DIRECTED     No current facility-administered medications for this visit.    Allergies:   Patient has no known allergies.   Social History:  The patient  reports that he has quit smoking. His smoking use included cigarettes. He has never used smokeless tobacco. He reports that he does not drink alcohol and does not use drugs.   Family History:  The  patient's family history includes Chronic Renal Failure in his brother; Heart attack (age of onset: 75) in his father; Pneumonia in his mother; Prostate cancer in his brother.    ROS:  Please see the history of present illness.   Otherwise, review of systems is positive for none.   All other systems are reviewed and negative.    PHYSICAL EXAM: VS:  BP 110/66   Pulse 73   Ht '5\' 11"'$  (1.803 m)   Wt 208 lb 3.2 oz (94.4 kg)   SpO2 97%   BMI 29.04 kg/m  , BMI Body mass index is 29.04 kg/m. GEN: Well nourished, well developed, in no acute distress  HEENT: normal  Neck: no JVD, carotid bruits, or masses Cardiac: RRR; no murmurs, rubs, or gallops,no edema  Respiratory:  clear to auscultation bilaterally, normal work of breathing GI: soft, nontender, nondistended, + BS MS: no deformity or atrophy  Skin: warm and dry Neuro:  Strength and sensation are intact Psych: euthymic mood, full affect  EKG:  EKG is ordered today. Personal review of the ekg ordered shows [place] rhythm, PVCs, rate 73  Recent Labs: No results found for requested labs within last 365 days.    Lipid Panel  No results found for: "CHOL", "TRIG", "HDL", "CHOLHDL", "VLDL", "LDLCALC", "LDLDIRECT"   Wt Readings from Last 3 Encounters:  05/15/22 208 lb 3.2 oz (94.4 kg)  10/18/21 200 lb (90.7 kg)  10/22/20 195 lb (88.5 kg)      Other studies Reviewed: Additional studies/ records that were reviewed today include: TTE 03/03/2020 Review of the above records today demonstrates:  Left ventricular ejection fraction 19% Grade 1 diastolic dysfunction mild mitral annular calcification Mild to moderate aortic regurgitation  Mild to moderately dilated left atrium Right ventricle normal in size and function Dilated aortic root, 4.2 cm    ASSESSMENT AND PLAN:  1.  Paroxysmal atrial fibrillation: CHA2DS2-VASc of 2.  Currently on Xarelto 20 mg daily.  He wore a cardiac monitor with a 14% atrial fibrillation burden.  Has some  mild fatigue.  Otherwise he is unaware of his atrial fibrillation.  He would prefer a rhythm control strategy.  We discussed therapy with either ablation or medication management.  He would prefer to avoid ablation if possible.  We Raynetta Osterloh start him on flecainide 50 mg twice daily and Toprol-XL 25 mg daily.  2.  Secondary hypercoagulable state: Currently on Xarelto for atrial fibrillation as above  3.  PVCs: Left ventricular in origin.  2.8% burden on his cardiac monitor.  Starting flecainide as above.  Current medicines are reviewed at length with the patient today.   The patient does not have concerns regarding his medicines.  The following changes were made today: Start flecainide, Toprol-XL  Labs/ tests ordered today include:  Orders Placed This Encounter  Procedures   EKG 12-Lead     Disposition:   FU 1 months  Signed, Marquinn Meschke Meredith Leeds, MD  05/15/2022 12:13 PM     [address] [place] [address] [phone] (office) [phone] (fax)

## 2022-05-15 NOTE — Telephone Encounter (Signed)
Pt calling back after his appt stating he would like to go through with the ablation versus the medication management

## 2022-05-16 NOTE — Telephone Encounter (Signed)
Aware he will need to remain on medication, at least temporarily, until after ablation if he chooses to proceed with ablation. Pt would like to schedule and aware I will be in touch at a later date to schedule this. Pt agreeable to plan.

## 2022-05-30 NOTE — Telephone Encounter (Signed)
Pt is scheduled for 10/25/21 @ 10:30am. He is aware that I will send instructions in the mail.

## 2022-05-31 ENCOUNTER — Other Ambulatory Visit: Payer: Self-pay

## 2022-05-31 DIAGNOSIS — I480 Paroxysmal atrial fibrillation: Secondary | ICD-10-CM

## 2022-05-31 NOTE — Telephone Encounter (Signed)
Instruction letters have been mailed to pt.

## 2022-06-12 ENCOUNTER — Ambulatory Visit (HOSPITAL_COMMUNITY)
Admission: RE | Admit: 2022-06-12 | Discharge: 2022-06-12 | Disposition: A | Discharge status: Home / Self Care | Payer: Medicare Other | Source: Ambulatory Visit | Attending: Physician Assistant | Admitting: Physician Assistant

## 2022-06-12 VITALS — BP 136/66 | HR 58 | Ht 71.0 in | Wt 208.0 lb

## 2022-06-12 DIAGNOSIS — Z79899 Other long term (current) drug therapy: Secondary | ICD-10-CM | POA: Insufficient documentation

## 2022-06-12 DIAGNOSIS — E785 Hyperlipidemia, unspecified: Secondary | ICD-10-CM | POA: Diagnosis not present

## 2022-06-12 DIAGNOSIS — D6869 Other thrombophilia: Secondary | ICD-10-CM | POA: Insufficient documentation

## 2022-06-12 DIAGNOSIS — E039 Hypothyroidism, unspecified: Secondary | ICD-10-CM | POA: Insufficient documentation

## 2022-06-12 DIAGNOSIS — I480 Paroxysmal atrial fibrillation: Secondary | ICD-10-CM | POA: Insufficient documentation

## 2022-06-12 DIAGNOSIS — Z7901 Long term (current) use of anticoagulants: Secondary | ICD-10-CM | POA: Insufficient documentation

## 2022-06-12 NOTE — Progress Notes (Signed)
Primary Care Physician: Caryl Bis, MD Primary Cardiologist: Dr Cathie Hoops [facility]) Primary Electrophysiologist: Dr Curt Bears  Referring Physician: Dr Carolyne Littles is a 76 y.o. male with a history of HLD, hypothyroidism, unprovoked DVT on lifelong anticoagulation, atrial fibrillation who presents for follow up in the [facility].  The patient was initially diagnosed with atrial fibrillation 07/2020. He wore a monitor which showed 14% afib burden. He does have symptoms of fatigue while in afib. Patient is on Xarelto for a CHADS2VASC score of 2. He was seen by Dr Curt Bears 05/15/22 and started on flecainide with plan for ablation.  On follow up today, patient reports that he has done well since his last visit. He states that he is a little less fatigued since starting flecainide. No bleeding issues on anticoagulation.   Today, he denies symptoms of palpitations, chest pain, shortness of breath, orthopnea, PND, lower extremity edema, dizziness, presyncope, syncope, snoring, daytime somnolence, bleeding, or neurologic sequela. The patient is tolerating medications without difficulties and is otherwise without complaint today.    Atrial Fibrillation Risk Factors:  he does not have symptoms or diagnosis of sleep apnea. he does not have a history of rheumatic fever.   he has a BMI of Body mass index is 29.01 kg/m.Marland Kitchen Filed Weights   06/12/22 1110  Weight: 94.3 kg    Family History  Problem Relation Age of Onset   Pneumonia Mother    Heart attack Father 9   Prostate cancer Brother    Chronic Renal Failure Brother      Atrial Fibrillation Management history:  Previous antiarrhythmic drugs: flecainide Previous cardioversions: none Previous ablations: none CHADS2VASC score: 2 Anticoagulation history: Xarelto   Past Medical History:  Diagnosis Date   Enlarged prostate without lower urinary tract symptoms (luts)    Essential  thrombocythemia ([place])    Hyperlipidemia    Hypothyroidism    Male erectile disorder    Other specified polyneuropathies    Polycythemia, secondary    Restless leg syndrome    Venous thrombosis    There is a mention of this in his notes.  He is on chronic anticoagulation.   Past Surgical History:  Procedure Laterality Date   AMPUTATION OF SECOND TOE     CATARACT EXTRACTION Left 09/2020   CATARACT EXTRACTION W/PHACO Left 10/08/2020   Procedure: CATARACT EXTRACTION PHACO AND INTRAOCULAR LENS PLACEMENT LEFT EYE;  Surgeon: Baruch Goldmann, MD;  Location: [facility];  Service: Ophthalmology;  Laterality: Left;  CDE 23.63   CATARACT EXTRACTION W/PHACO Right 10/22/2020   Procedure: CATARACT EXTRACTION PHACO AND INTRAOCULAR LENS PLACEMENT (IOC);  Surgeon: Baruch Goldmann, MD;  Location: [facility];  Service: Ophthalmology;  Laterality: Right;  CDE: 16.83    COLONOSCOPY  2009   INGUINAL HERNIA REPAIR  1998   LARYNGEAL POLYP     TONSILLECTOMY     TRANSURETHRAL RESECTION OF PROSTATE  2016   UPPER GASTROINTESTINAL ENDOSCOPY      Current Outpatient Medications  Medication Sig Dispense Refill   aspirin 81 MG EC tablet Take 81 mg by mouth daily.     flecainide (TAMBOCOR) 50 MG tablet Take 1 tablet (50 mg total) by mouth 2 (two) times daily. 60 tablet 3   gabapentin (NEURONTIN) 300 MG capsule Take 300 mg by mouth 2 (two) times daily.     hydroxyurea (HYDREA) 500 MG capsule Take 1,000 mg by mouth daily. May take with food to minimize GI side effects.  levothyroxine (SYNTHROID) 88 MCG tablet Take 88 mcg by mouth daily before breakfast.     metoprolol succinate (TOPROL-XL) 25 MG 24 hr tablet Take 1 tablet (25 mg total) by mouth at bedtime. Take with or immediately following a meal. 30 tablet 3   Multiple Vitamin (MULTIVITAMIN WITH MINERALS) TABS tablet Take 1 tablet by mouth daily.     omeprazole (PRILOSEC) 20 MG capsule Take 20 mg by mouth daily.     Rivaroxaban (XARELTO PO) Take 20 mg by mouth every  morning.     tamsulosin (FLOMAX) 0.4 MG CAPS capsule Take 0.4 mg by mouth in the morning and at bedtime.     No current facility-administered medications for this encounter.    No Known Allergies  Social History   Socioeconomic History   Marital status: Married    Spouse name: Not on file   Number of children: Not on file   Years of education: Not on file   Highest education level: Not on file  Occupational History   Not on file  Tobacco Use   Smoking status: Former    Types: Cigarettes   Smokeless tobacco: Never  Vaping Use   Vaping Use: Never used  Substance and Sexual Activity   Alcohol use: Never   Drug use: Never   Sexual activity: Not on file  Other Topics Concern   Not on file  Social History Narrative   Lives with wife.    Social Determinants of Health   Financial Resource Strain: Not on file  Food Insecurity: Not on file  Transportation Needs: Not on file  Physical Activity: Not on file  Stress: Not on file  Social Connections: Not on file  Intimate Partner Violence: Not on file     ROS- All systems are reviewed and negative except as per the HPI above.  Physical Exam: Vitals:   06/12/22 1110  BP: 136/66  Pulse: (!) 58  Weight: 94.3 kg  Height: '5\' 11"'$  (1.803 m)    GEN- The patient is a well appearing elderly male, alert and oriented x 3 today.   Head- normocephalic, atraumatic Eyes-  Sclera clear, conjunctiva pink Ears- hearing intact Oropharynx- clear Neck- supple  Lungs- Clear to ausculation bilaterally, normal work of breathing Heart- Regular rate and rhythm, no murmurs, rubs or gallops  GI- soft, NT, ND, + BS Extremities- no clubbing, cyanosis, or edema MS- no significant deformity or atrophy Skin- no rash or lesion Psych- euthymic mood, full affect Neuro- strength and sensation are intact  Wt Readings from Last 3 Encounters:  06/12/22 94.3 kg  05/15/22 94.4 kg  10/18/21 90.7 kg    EKG today demonstrates  SR, 1st degree AV  block, LAFB Vent. rate 58 BPM PR interval 218 ms QRS duration 118 ms QT/QTcB 440/431 ms  Echo 03/03/20 demonstrated  Left ventricular ejection fraction 96% Grade 1 diastolic dysfunction mild mitral annular calcification Mild to moderate aortic regurgitation  Mild to moderately dilated left atrium Right ventricle normal in size and function Dilated aortic root, 4.2 cm  Epic records are reviewed at length today  CHA2DS2-VASc Score = 2  The patient's score is based upon: CHF History: 0 HTN History: 0 Diabetes History: 0 Stroke History: 0 Vascular Disease History: 0 Age Score: 2 Gender Score: 0       ASSESSMENT AND PLAN: 1. Paroxysmal Atrial Fibrillation (ICD10:  I48.0) The patient's CHA2DS2-VASc score is 2, indicating a 2.2% annual risk of stroke.   Patient appears to be maintaining SR.  Continue flecainide 50 mg BID Continue Toprol 25 mg daily Continue Xarelto 20 mg daily Patient is scheduled for ablation 10/2022  2. Secondary Hypercoagulable State (ICD10:  D68.69) The patient is at significant risk for stroke/thromboembolism based upon his CHA2DS2-VASc Score of 2.  Continue Rivaroxaban (Xarelto).    Follow up with Oda Kilts as scheduled.    [facility] [address] [place], [place] [address] [phone] 06/12/2022 1:03 PM

## 2022-07-28 DIAGNOSIS — E782 Mixed hyperlipidemia: Secondary | ICD-10-CM | POA: Diagnosis not present

## 2022-07-28 DIAGNOSIS — K219 Gastro-esophageal reflux disease without esophagitis: Secondary | ICD-10-CM | POA: Diagnosis not present

## 2022-07-28 DIAGNOSIS — E039 Hypothyroidism, unspecified: Secondary | ICD-10-CM | POA: Diagnosis not present

## 2022-07-28 DIAGNOSIS — D649 Anemia, unspecified: Secondary | ICD-10-CM | POA: Diagnosis not present

## 2022-07-28 DIAGNOSIS — D519 Vitamin B12 deficiency anemia, unspecified: Secondary | ICD-10-CM | POA: Diagnosis not present

## 2022-07-28 DIAGNOSIS — D529 Folate deficiency anemia, unspecified: Secondary | ICD-10-CM | POA: Diagnosis not present

## 2022-07-28 DIAGNOSIS — E7849 Other hyperlipidemia: Secondary | ICD-10-CM | POA: Diagnosis not present

## 2022-08-03 DIAGNOSIS — G6289 Other specified polyneuropathies: Secondary | ICD-10-CM | POA: Diagnosis not present

## 2022-08-03 DIAGNOSIS — E039 Hypothyroidism, unspecified: Secondary | ICD-10-CM | POA: Diagnosis not present

## 2022-08-03 DIAGNOSIS — E7849 Other hyperlipidemia: Secondary | ICD-10-CM | POA: Diagnosis not present

## 2022-08-03 DIAGNOSIS — D473 Essential (hemorrhagic) thrombocythemia: Secondary | ICD-10-CM | POA: Diagnosis not present

## 2022-08-03 DIAGNOSIS — D751 Secondary polycythemia: Secondary | ICD-10-CM | POA: Diagnosis not present

## 2022-08-03 DIAGNOSIS — I4891 Unspecified atrial fibrillation: Secondary | ICD-10-CM | POA: Diagnosis not present

## 2022-08-03 DIAGNOSIS — G2581 Restless legs syndrome: Secondary | ICD-10-CM | POA: Diagnosis not present

## 2022-08-03 DIAGNOSIS — R030 Elevated blood-pressure reading, without diagnosis of hypertension: Secondary | ICD-10-CM | POA: Diagnosis not present

## 2022-08-28 DIAGNOSIS — G4733 Obstructive sleep apnea (adult) (pediatric): Secondary | ICD-10-CM | POA: Diagnosis not present

## 2022-08-28 DIAGNOSIS — I480 Paroxysmal atrial fibrillation: Secondary | ICD-10-CM | POA: Diagnosis not present

## 2022-09-07 ENCOUNTER — Other Ambulatory Visit: Payer: Self-pay | Admitting: Cardiology

## 2022-10-11 NOTE — Progress Notes (Unsigned)
  Electrophysiology Office Note:   Date:  10/12/2022  ID:  Alexander Maynard, DOB 1946/02/01, MRN [id number]  Primary Cardiologist: Minus Breeding, MD Electrophysiologist: Constance Haw, MD   History of Present Illness:   Alexander Maynard is a 77 y.o. male seen today for routine electrophysiology followup. Since last being seen in our clinic the patient reports doing well overall. Hasn't had too many palpitations.  he denies chest pain, palpitations, dyspnea, PND, orthopnea, nausea, vomiting, dizziness, syncope, edema, weight gain, or early satiety.   Review of systems complete and found to be negative unless listed in HPI.   Past Medical History:  Diagnosis Date   Enlarged prostate without lower urinary tract symptoms (luts)    Essential thrombocythemia ([place])    Hyperlipidemia    Hypothyroidism    Male erectile disorder    Other specified polyneuropathies    Polycythemia, secondary    Restless leg syndrome    Venous thrombosis    There is a mention of this in his notes.  He is on chronic anticoagulation.     Studies Reviewed:    EKG is ordered today. Personal review shows sinus bradycardia at 51 bpm, 1st degree AV block but stable.   Risk Assessment/Calculations:    CHA2DS2-VASc Score = at least 6           Physical Exam:   VS:  BP 138/70   Pulse (!) 51   Ht 5\' 11"  (1.803 m)   Wt 209 lb (94.8 kg)   SpO2 98%   BMI 29.15 kg/m    Wt Readings from Last 3 Encounters:  10/12/22 209 lb (94.8 kg)  06/12/22 208 lb (94.3 kg)  05/15/22 208 lb 3.2 oz (94.4 kg)     GEN: Well nourished, well developed in no acute distress NECK: No JVD; No carotid bruits CARDIAC: Regular rate and rhythm, no murmurs, rubs, gallops RESPIRATORY:  Clear to auscultation without rales, wheezing or rhonchi  ABDOMEN: Soft, non-tender, non-distended EXTREMITIES:  No edema; No deformity   ASSESSMENT AND PLAN:    Paroxysmal atrial fibrillation:  Continue Xarelto for CHA2DS2-VASc of at least 6 EKG  today shows sinus bradycardia at 51 bpm.  QRS/PR interval slightly longer than prior to flecainide, but measured manually is < 20% change.  Continue flecainide 50 mg BID Continue Toprol 25 mg daily.  Pt is scheduled for ablation with Dr. Curt Bears 10/26/2022. Reviewed risks, benefits, and alternatives and pt is agreeable to proceed    PVCs Low burden on monitor EKG today as above Continue flecainide.    H/o DVT Continue Xarelto  Follow up with Dr. Curt Bears as usual post procedure  Signed, Shirley Friar, PA-C

## 2022-10-11 NOTE — H&P (View-Only) (Signed)
  Electrophysiology Office Note:   Date:  10/12/2022  ID:  Malakhai D Benecke, DOB 01/04/1946, MRN [id number]  Primary Cardiologist: James Hochrein, MD Electrophysiologist: Will Martin Camnitz, MD   History of Present Illness:   Alexander Maynard is a 77 y.o. male seen today for routine electrophysiology followup. Since last being seen in our clinic the patient reports doing well overall. Hasn't had too many palpitations.  he denies chest pain, palpitations, dyspnea, PND, orthopnea, nausea, vomiting, dizziness, syncope, edema, weight gain, or early satiety.   Review of systems complete and found to be negative unless listed in HPI.   Past Medical History:  Diagnosis Date   Enlarged prostate without lower urinary tract symptoms (luts)    Essential thrombocythemia ([facility])    Hyperlipidemia    Hypothyroidism    Male erectile disorder    Other specified polyneuropathies    Polycythemia, secondary    Restless leg syndrome    Venous thrombosis    There is a mention of this in his notes.  He is on chronic anticoagulation.     Studies Reviewed:    EKG is ordered today. Personal review shows sinus bradycardia at 51 bpm, 1st degree AV block but stable.   Risk Assessment/Calculations:    CHA2DS2-VASc Score = at least 6           Physical Exam:   VS:  BP 138/70   Pulse (!) 51   Ht 5' 11" (1.803 m)   Wt 209 lb (94.8 kg)   SpO2 98%   BMI 29.15 kg/m    Wt Readings from Last 3 Encounters:  10/12/22 209 lb (94.8 kg)  06/12/22 208 lb (94.3 kg)  05/15/22 208 lb 3.2 oz (94.4 kg)     GEN: Well nourished, well developed in no acute distress NECK: No JVD; No carotid bruits CARDIAC: Regular rate and rhythm, no murmurs, rubs, gallops RESPIRATORY:  Clear to auscultation without rales, wheezing or rhonchi  ABDOMEN: Soft, non-tender, non-distended EXTREMITIES:  No edema; No deformity   ASSESSMENT AND PLAN:    Paroxysmal atrial fibrillation:  Continue Xarelto for CHA2DS2-VASc of at least 6 EKG  today shows sinus bradycardia at 51 bpm.  QRS/PR interval slightly longer than prior to flecainide, but measured manually is < 20% change.  Continue flecainide 50 mg BID Continue Toprol 25 mg daily.  Pt is scheduled for ablation with Dr. Camnitz 10/26/2022. Reviewed risks, benefits, and alternatives and pt is agreeable to proceed    PVCs Low burden on monitor EKG today as above Continue flecainide.    H/o DVT Continue Xarelto  Follow up with Dr. Camnitz as usual post procedure  Signed, Laykin Rainone Andrew Raechell Singleton, PA-C  

## 2022-10-12 ENCOUNTER — Ambulatory Visit: Payer: Medicare Other | Attending: Student | Admitting: Student

## 2022-10-12 ENCOUNTER — Encounter: Payer: Self-pay | Admitting: Student

## 2022-10-12 VITALS — BP 138/70 | HR 51 | Ht 71.0 in | Wt 209.0 lb

## 2022-10-12 DIAGNOSIS — I480 Paroxysmal atrial fibrillation: Secondary | ICD-10-CM | POA: Diagnosis not present

## 2022-10-12 DIAGNOSIS — D6869 Other thrombophilia: Secondary | ICD-10-CM | POA: Diagnosis not present

## 2022-10-12 DIAGNOSIS — I493 Ventricular premature depolarization: Secondary | ICD-10-CM | POA: Diagnosis not present

## 2022-10-12 NOTE — Patient Instructions (Signed)
Medication Instructions:  Your physician recommends that you continue on your current medications as directed. Please refer to the Current Medication list given to you today.  *If you need a refill on your cardiac medications before your next appointment, please call your pharmacy*   Lab Work: BMET, CBC today If you have labs (blood work) drawn today and your tests are completely normal, you will receive your results only by: [place] (if you have MyChart) OR A paper copy in the mail If you have any lab test that is abnormal or we need to change your treatment, we will call you to review the results.   Testing/Procedures: See instruction letters   Follow-Up: At [facility], you and your health needs are our priority.  As part of our continuing mission to provide you with exceptional heart care, we have created designated Provider Care Teams.  These Care Teams include your primary Cardiologist (physician) and Advanced Practice Providers (APPs -  Physician Assistants and Nurse Practitioners) who all work together to provide you with the care you need, when you need it.  We recommend signing up for the patient portal called "MyChart".  Sign up information is provided on this After Visit Summary.  MyChart is used to connect with patients for Virtual Visits (Telemedicine).  Patients are able to view lab/test results, encounter notes, upcoming appointments, etc.  Non-urgent messages can be sent to your provider as well.   To learn more about what you can do with MyChart, go to [email or web address].    Your next appointment:   Keep follow up appointments as scheduled

## 2022-10-13 LAB — BASIC METABOLIC PANEL
BUN/Creatinine Ratio: 10 (ref 10–24)
BUN: 10 mg/dL (ref 8–27)
CO2: 23 mmol/L (ref 20–29)
Calcium: 9.5 mg/dL (ref 8.6–10.2)
Chloride: 102 mmol/L (ref 96–106)
Creatinine, Ser: 0.97 mg/dL (ref 0.76–1.27)
Glucose: 89 mg/dL (ref 70–99)
Potassium: 4.8 mmol/L (ref 3.5–5.2)
Sodium: 138 mmol/L (ref 134–144)
eGFR: 80 mL/min/{1.73_m2} (ref >59)

## 2022-10-13 LAB — CBC
Hematocrit: 31 % — ABNORMAL LOW (ref 37.5–51.0)
Hemoglobin: 11 g/dL — ABNORMAL LOW (ref 13.0–17.7)
MCH: 37.9 pg — ABNORMAL HIGH (ref 26.6–33.0)
MCHC: 35.5 g/dL (ref 31.5–35.7)
MCV: 107 fL — ABNORMAL HIGH (ref 79–97)
Platelets: 272 10*3/uL (ref 150–450)
RBC: 2.9 x10E6/uL — ABNORMAL LOW (ref 4.14–5.80)
RDW: 14.8 % (ref 11.6–15.4)
WBC: 9.5 10*3/uL (ref 3.4–10.8)

## 2022-10-18 ENCOUNTER — Telehealth (HOSPITAL_COMMUNITY): Payer: Self-pay | Admitting: *Deleted

## 2022-10-18 NOTE — Telephone Encounter (Signed)
Reaching out to patient to offer assistance regarding upcoming cardiac imaging study; pt verbalizes understanding of appt date/time, parking situation and where to check in, pre-test NPO status, and verified current allergies; name and call back number provided for further questions should they arise  Alexander Sassi RN Navigator Cardiac Imaging [facility] [phone] office [phone] cell  

## 2022-10-19 ENCOUNTER — Encounter (HOSPITAL_BASED_OUTPATIENT_CLINIC_OR_DEPARTMENT_OTHER): Payer: Self-pay

## 2022-10-19 ENCOUNTER — Ambulatory Visit (HOSPITAL_BASED_OUTPATIENT_CLINIC_OR_DEPARTMENT_OTHER)
Admission: RE | Admit: 2022-10-19 | Discharge: 2022-10-19 | Disposition: A | Discharge status: Home / Self Care | Payer: Medicare Other | Source: Ambulatory Visit | Attending: Cardiology | Admitting: Cardiology

## 2022-10-19 DIAGNOSIS — I480 Paroxysmal atrial fibrillation: Secondary | ICD-10-CM | POA: Insufficient documentation

## 2022-10-19 MED ORDER — IOHEXOL 350 MG/ML SOLN
100.0000 mL | Freq: Once | INTRAVENOUS | Status: AC | PRN
  Administered 2022-10-19: 80 mL via INTRAVENOUS

## 2022-10-25 NOTE — Pre-Procedure Instructions (Signed)
Attempted to call patient regarding procedure instructions.  Left voicemail on the following items: Arrival time 0800 Nothing to eat or drink after midnight No meds AM of procedure Responsible person to drive you home and stay with you for 24 hrs  Have you missed any doses of anti-coagulant Xarelto- if you have missed any doses please let the office know right away.

## 2022-10-26 ENCOUNTER — Observation Stay (HOSPITAL_COMMUNITY)
Admission: RE | Admit: 2022-10-26 | Discharge: 2022-10-27 | Disposition: A | Discharge status: Home / Self Care | Payer: Medicare Other | Attending: Cardiology | Admitting: Cardiology

## 2022-10-26 ENCOUNTER — Ambulatory Visit (HOSPITAL_COMMUNITY): Payer: Medicare Other | Admitting: Anesthesiology

## 2022-10-26 ENCOUNTER — Encounter (HOSPITAL_COMMUNITY)
Admission: RE | Disposition: A | Discharge status: Home / Self Care | Payer: Self-pay | Source: Home / Self Care | Attending: Cardiology

## 2022-10-26 ENCOUNTER — Encounter (HOSPITAL_COMMUNITY): Payer: Self-pay | Admitting: Cardiology

## 2022-10-26 ENCOUNTER — Ambulatory Visit (HOSPITAL_BASED_OUTPATIENT_CLINIC_OR_DEPARTMENT_OTHER): Payer: Medicare Other | Admitting: Anesthesiology

## 2022-10-26 ENCOUNTER — Other Ambulatory Visit: Payer: Self-pay

## 2022-10-26 DIAGNOSIS — Z86718 Personal history of other venous thrombosis and embolism: Secondary | ICD-10-CM | POA: Diagnosis not present

## 2022-10-26 DIAGNOSIS — I4891 Unspecified atrial fibrillation: Secondary | ICD-10-CM | POA: Diagnosis not present

## 2022-10-26 DIAGNOSIS — Z7901 Long term (current) use of anticoagulants: Secondary | ICD-10-CM | POA: Diagnosis not present

## 2022-10-26 DIAGNOSIS — E039 Hypothyroidism, unspecified: Secondary | ICD-10-CM | POA: Insufficient documentation

## 2022-10-26 DIAGNOSIS — I480 Paroxysmal atrial fibrillation: Secondary | ICD-10-CM | POA: Diagnosis not present

## 2022-10-26 DIAGNOSIS — Z87891 Personal history of nicotine dependence: Secondary | ICD-10-CM

## 2022-10-26 DIAGNOSIS — G709 Myoneural disorder, unspecified: Secondary | ICD-10-CM

## 2022-10-26 DIAGNOSIS — I493 Ventricular premature depolarization: Secondary | ICD-10-CM | POA: Insufficient documentation

## 2022-10-26 DIAGNOSIS — T148XXA Other injury of unspecified body region, initial encounter: Secondary | ICD-10-CM | POA: Diagnosis present

## 2022-10-26 DIAGNOSIS — D751 Secondary polycythemia: Secondary | ICD-10-CM | POA: Diagnosis not present

## 2022-10-26 HISTORY — PX: ATRIAL FIBRILLATION ABLATION: EP1191

## 2022-10-26 SURGERY — ATRIAL FIBRILLATION ABLATION
Anesthesia: General

## 2022-10-26 MED ORDER — ACETAMINOPHEN 325 MG PO TABS: 650.0000 mg | ORAL_TABLET | ORAL | Status: DC | PRN

## 2022-10-26 MED ORDER — SODIUM CHLORIDE 0.9 % IV SOLN: 250.0000 mL | INTRAVENOUS | Status: DC | PRN

## 2022-10-26 MED ORDER — FENTANYL CITRATE (PF) 250 MCG/5ML IJ SOLN
INTRAMUSCULAR | Status: DC | PRN
  Administered 2022-10-26: 100 ug via INTRAVENOUS

## 2022-10-26 MED ORDER — HEPARIN (PORCINE) IN NACL 1000-0.9 UT/500ML-% IV SOLN
INTRAVENOUS | Status: DC | PRN
  Administered 2022-10-26 (×4): 500 mL

## 2022-10-26 MED ORDER — PROPOFOL 10 MG/ML IV BOLUS
INTRAVENOUS | Status: DC | PRN
  Administered 2022-10-26: 160 mg via INTRAVENOUS

## 2022-10-26 MED ORDER — SUGAMMADEX SODIUM 200 MG/2ML IV SOLN
INTRAVENOUS | Status: DC | PRN
  Administered 2022-10-26: 200 mg via INTRAVENOUS

## 2022-10-26 MED ORDER — SODIUM CHLORIDE 0.9 % IV SOLN: INTRAVENOUS | Status: DC

## 2022-10-26 MED ORDER — GABAPENTIN 300 MG PO CAPS
300.0000 mg | ORAL_CAPSULE | Freq: Two times a day (BID) | ORAL | Status: DC
  Administered 2022-10-26 – 2022-10-27 (×2): 300 mg via ORAL
  Filled 2022-10-26 (×2): qty 1

## 2022-10-26 MED ORDER — SODIUM CHLORIDE 0.9% FLUSH
3.0000 mL | Freq: Two times a day (BID) | INTRAVENOUS | Status: DC
  Administered 2022-10-27: 3 mL via INTRAVENOUS

## 2022-10-26 MED ORDER — TAMSULOSIN HCL 0.4 MG PO CAPS
0.4000 mg | ORAL_CAPSULE | Freq: Two times a day (BID) | ORAL | Status: DC
  Administered 2022-10-26 – 2022-10-27 (×2): 0.4 mg via ORAL
  Filled 2022-10-26 (×2): qty 1

## 2022-10-26 MED ORDER — LIDOCAINE 2% (20 MG/ML) 5 ML SYRINGE
INTRAMUSCULAR | Status: DC | PRN
  Administered 2022-10-26: 80 mg via INTRAVENOUS

## 2022-10-26 MED ORDER — HEPARIN SODIUM (PORCINE) 1000 UNIT/ML IJ SOLN
INTRAMUSCULAR | Status: AC
  Filled 2022-10-26: qty 10

## 2022-10-26 MED ORDER — DOBUTAMINE INFUSION FOR EP/ECHO/NUC (1000 MCG/ML)
INTRAVENOUS | Status: AC
  Filled 2022-10-26: qty 250

## 2022-10-26 MED ORDER — ACETAMINOPHEN 500 MG PO TABS
1000.0000 mg | ORAL_TABLET | Freq: Once | ORAL | Status: AC
  Administered 2022-10-26: 1000 mg via ORAL

## 2022-10-26 MED ORDER — ONDANSETRON HCL 4 MG/2ML IJ SOLN: 4.0000 mg | Freq: Four times a day (QID) | INTRAMUSCULAR | Status: DC | PRN

## 2022-10-26 MED ORDER — HEPARIN SODIUM (PORCINE) 1000 UNIT/ML IJ SOLN
INTRAMUSCULAR | Status: DC | PRN
  Administered 2022-10-26: 14000 [IU] via INTRAVENOUS
  Administered 2022-10-26: 7000 [IU] via INTRAVENOUS

## 2022-10-26 MED ORDER — HYDROXYUREA 500 MG PO CAPS
1000.0000 mg | ORAL_CAPSULE | Freq: Every day | ORAL | Status: DC
  Administered 2022-10-27: 1000 mg via ORAL
  Filled 2022-10-26: qty 2

## 2022-10-26 MED ORDER — DOBUTAMINE INFUSION FOR EP/ECHO/NUC (1000 MCG/ML)
INTRAVENOUS | Status: DC | PRN
  Administered 2022-10-26: 20 ug/kg/min via INTRAVENOUS

## 2022-10-26 MED ORDER — ROCURONIUM BROMIDE 10 MG/ML (PF) SYRINGE
PREFILLED_SYRINGE | INTRAVENOUS | Status: DC | PRN
  Administered 2022-10-26: 60 mg via INTRAVENOUS

## 2022-10-26 MED ORDER — PHENYLEPHRINE 80 MCG/ML (10ML) SYRINGE FOR IV PUSH (FOR BLOOD PRESSURE SUPPORT)
PREFILLED_SYRINGE | INTRAVENOUS | Status: DC | PRN
  Administered 2022-10-26: 80 ug via INTRAVENOUS

## 2022-10-26 MED ORDER — ONDANSETRON HCL 4 MG/2ML IJ SOLN
INTRAMUSCULAR | Status: DC | PRN
  Administered 2022-10-26: 4 mg via INTRAVENOUS

## 2022-10-26 MED ORDER — HEPARIN SODIUM (PORCINE) 1000 UNIT/ML IJ SOLN
INTRAMUSCULAR | Status: DC | PRN
  Administered 2022-10-26: 1000 [IU] via INTRAVENOUS

## 2022-10-26 MED ORDER — PROTAMINE SULFATE 10 MG/ML IV SOLN
INTRAVENOUS | Status: DC | PRN
  Administered 2022-10-26: 40 mg via INTRAVENOUS

## 2022-10-26 MED ORDER — SODIUM CHLORIDE 0.9% FLUSH: 3.0000 mL | INTRAVENOUS | Status: DC | PRN

## 2022-10-26 MED ORDER — PANTOPRAZOLE SODIUM 40 MG PO TBEC
40.0000 mg | DELAYED_RELEASE_TABLET | Freq: Every day | ORAL | Status: DC
  Administered 2022-10-26 – 2022-10-27 (×2): 40 mg via ORAL
  Filled 2022-10-26 (×2): qty 1

## 2022-10-26 MED ORDER — PHENYLEPHRINE HCL-NACL 20-0.9 MG/250ML-% IV SOLN
INTRAVENOUS | Status: DC | PRN
  Administered 2022-10-26: 40 ug/min via INTRAVENOUS

## 2022-10-26 MED ORDER — DEXAMETHASONE SODIUM PHOSPHATE 10 MG/ML IJ SOLN
INTRAMUSCULAR | Status: DC | PRN
  Administered 2022-10-26: 10 mg via INTRAVENOUS

## 2022-10-26 MED ORDER — ACETAMINOPHEN 500 MG PO TABS
ORAL_TABLET | ORAL | Status: AC
  Filled 2022-10-26: qty 2

## 2022-10-26 MED ORDER — LEVOTHYROXINE SODIUM 88 MCG PO TABS
88.0000 ug | ORAL_TABLET | Freq: Every day | ORAL | Status: DC
  Administered 2022-10-27: 88 ug via ORAL
  Filled 2022-10-26: qty 1

## 2022-10-26 SURGICAL SUPPLY — 20 items
BAG SNAP BAND KOVER 36X36 (MISCELLANEOUS) IMPLANT
BLANKET WARM UNDERBOD FULL ACC (MISCELLANEOUS) ×1 IMPLANT
CATH ABLAT QDOT MICRO BI TC DF (CATHETERS) IMPLANT
CATH OCTARAY 2.0 F 3-3-3-3-3 (CATHETERS) IMPLANT
CATH PIGTAIL STEERABLE D1 8.7 (WIRE) IMPLANT
CATH S-M CIRCA TEMP PROBE (CATHETERS) IMPLANT
CATH SOUNDSTAR ECO 8FR (CATHETERS) IMPLANT
CATH WEB BI DIR CSDF CRV REPRO (CATHETERS) IMPLANT
CLOSURE PERCLOSE PROSTYLE (VASCULAR PRODUCTS) IMPLANT
COVER SWIFTLINK CONNECTOR (BAG) ×1 IMPLANT
PACK EP LATEX FREE (CUSTOM PROCEDURE TRAY) ×1
PACK EP LF (CUSTOM PROCEDURE TRAY) ×1 IMPLANT
PAD DEFIB RADIO PHYSIO CONN (PAD) ×1 IMPLANT
PATCH CARTO3 (PAD) IMPLANT
SHEATH CARTO VIZIGO SM CVD (SHEATH) IMPLANT
SHEATH PINNACLE 7F 10CM (SHEATH) IMPLANT
SHEATH PINNACLE 8F 10CM (SHEATH) IMPLANT
SHEATH PINNACLE 9F 10CM (SHEATH) IMPLANT
SHEATH PROBE COVER 6X72 (BAG) IMPLANT
TUBING SMART ABLATE COOLFLOW (TUBING) IMPLANT

## 2022-10-26 NOTE — Discharge Instructions (Signed)

## 2022-10-26 NOTE — Progress Notes (Signed)
Patient transported to [facility]M with [facility].

## 2022-10-26 NOTE — Transfer of Care (Signed)
Immediate Anesthesia Transfer of Care Note  Patient: WYETT FORCUCCI  Procedure(s) Performed: ATRIAL FIBRILLATION ABLATION  Patient Location: Cath Lab  Anesthesia Type:General  Level of Consciousness: awake, alert , and oriented  Airway & Oxygen Therapy: Patient Spontanous Breathing and Patient connected to nasal cannula oxygen  Post-op Assessment: Report given to RN and Post -op Vital signs reviewed and stable  Post vital signs: Reviewed and stable  Last Vitals:  Vitals Value Taken Time  BP 124/65 10/26/22 1216  Temp    Pulse 70 10/26/22 1220  Resp 23 10/26/22 1220  SpO2 95 % 10/26/22 1220  Vitals shown include unvalidated device data.  Last Pain:  Vitals:   10/26/22 0815  TempSrc: Oral         Complications: There were no known notable events for this encounter.

## 2022-10-26 NOTE — Anesthesia Procedure Notes (Signed)
Procedure Name: Intubation Date/Time: 10/26/2022 10:39 AM  Performed by: Heide Scales, CRNAPre-anesthesia Checklist: Patient identified, Emergency Drugs available, Suction available and Patient being monitored Patient Re-evaluated:Patient Re-evaluated prior to induction Oxygen Delivery Method: Circle system utilized Preoxygenation: Pre-oxygenation with 100% oxygen Induction Type: IV induction Ventilation: Mask ventilation without difficulty Laryngoscope Size: Mac and 4 Grade View: Grade I Tube type: Oral Tube size: 7.5 mm Number of attempts: 1 Airway Equipment and Method: Stylet and Oral airway Placement Confirmation: ETT inserted through vocal cords under direct vision, positive ETCO2 and breath sounds checked- equal and bilateral Secured at: 22 cm Tube secured with: Tape Dental Injury: Teeth and Oropharynx as per pre-operative assessment

## 2022-10-26 NOTE — Progress Notes (Signed)
Prior to ambulating patient at 1600. Bilateral groin sites were clean, dry, intact without hematoma. After ambulating patient in the hall, patient developed a 20 cm hematoma to R groin site. Patient instructed to lie down flat and pressure was held to R groin site for a total of 35 minutes in which the hematoma resolved. VSS. Patient stable. Camnitz, MD notified and at bedside. MD put in orders for patient to be admitted. Will continue to monitor.

## 2022-10-26 NOTE — Interval H&P Note (Signed)
History and Physical Interval Note:  10/26/2022 9:36 AM  Alexander Maynard  has presented today for surgery, with the diagnosis of afib.  The various methods of treatment have been discussed with the patient and family. After consideration of risks, benefits and other options for treatment, the patient has consented to  Procedure(s): ATRIAL FIBRILLATION ABLATION (N/A) as a surgical intervention.  The patient's history has been reviewed, patient examined, no change in status, stable for surgery.  I have reviewed the patient's chart and labs.  Questions were answered to the patient's satisfaction.     Imonie Tuch [employer]

## 2022-10-26 NOTE — Progress Notes (Signed)
Patient laid flat for an hour on bedrest and remains hematoma free. At 1740, patient sat up to 30 degrees to eat and remains hematoma free from his bilateral groin sites.

## 2022-10-26 NOTE — Anesthesia Preprocedure Evaluation (Addendum)
Anesthesia Evaluation  Patient identified by MRN, date of birth, ID band Patient awake    Reviewed: Allergy & Precautions, NPO status , Patient's Chart, lab work & pertinent test results, reviewed documented beta blocker date and time   Airway Mallampati: II  TM Distance: >3 FB Neck ROM: Full    Dental  (+) Dental Advisory Given, Lower Dentures, Upper Dentures   Pulmonary former smoker   Pulmonary exam normal breath sounds clear to auscultation       Cardiovascular Pt. on home beta blockers Normal cardiovascular exam+ dysrhythmias Atrial Fibrillation  Rhythm:Regular Rate:Normal     Neuro/Psych  Neuromuscular disease    GI/Hepatic Neg liver ROS,GERD  Medicated,,  Endo/Other  Hypothyroidism    Renal/GU negative Renal ROS     Musculoskeletal negative musculoskeletal ROS (+)    Abdominal   Peds  Hematology  (+) Blood dyscrasia (Polycythemia, secondary; Xarelto)   Anesthesia Other Findings Day of surgery medications reviewed with the patient.  Reproductive/Obstetrics                             Anesthesia Physical Anesthesia Plan  ASA: 3  Anesthesia Plan: General   Post-op Pain Management: Tylenol PO (pre-op)*   Induction: Intravenous  PONV Risk Score and Plan: 2 and Dexamethasone and Ondansetron  Airway Management Planned: Oral ETT  Additional Equipment:   Intra-op Plan:   Post-operative Plan: Extubation in OR  Informed Consent: I have reviewed the patients History and Physical, chart, labs and discussed the procedure including the risks, benefits and alternatives for the proposed anesthesia with the patient or authorized representative who has indicated his/her understanding and acceptance.     Dental advisory given  Plan Discussed with: CRNA  Anesthesia Plan Comments:        Anesthesia Quick Evaluation

## 2022-10-27 ENCOUNTER — Observation Stay (HOSPITAL_BASED_OUTPATIENT_CLINIC_OR_DEPARTMENT_OTHER): Payer: Medicare Other

## 2022-10-27 ENCOUNTER — Encounter (HOSPITAL_COMMUNITY): Payer: Self-pay | Admitting: Cardiology

## 2022-10-27 DIAGNOSIS — I97190 Other postprocedural cardiac functional disturbances following cardiac surgery: Secondary | ICD-10-CM

## 2022-10-27 DIAGNOSIS — Z86718 Personal history of other venous thrombosis and embolism: Secondary | ICD-10-CM | POA: Diagnosis not present

## 2022-10-27 DIAGNOSIS — I493 Ventricular premature depolarization: Secondary | ICD-10-CM | POA: Diagnosis not present

## 2022-10-27 DIAGNOSIS — I480 Paroxysmal atrial fibrillation: Secondary | ICD-10-CM | POA: Diagnosis not present

## 2022-10-27 DIAGNOSIS — Z7901 Long term (current) use of anticoagulants: Secondary | ICD-10-CM | POA: Diagnosis not present

## 2022-10-27 DIAGNOSIS — D751 Secondary polycythemia: Secondary | ICD-10-CM | POA: Diagnosis not present

## 2022-10-27 DIAGNOSIS — E039 Hypothyroidism, unspecified: Secondary | ICD-10-CM | POA: Diagnosis not present

## 2022-10-27 LAB — CBC
HCT: 34.5 % — ABNORMAL LOW (ref 39.0–52.0)
Hemoglobin: 11.8 g/dL — ABNORMAL LOW (ref 13.0–17.0)
MCH: 36.9 pg — ABNORMAL HIGH (ref 26.0–34.0)
MCHC: 34.2 g/dL (ref 30.0–36.0)
MCV: 107.8 fL — ABNORMAL HIGH (ref 80.0–100.0)
Platelets: 377 10*3/uL (ref 150–400)
RBC: 3.2 MIL/uL — ABNORMAL LOW (ref 4.22–5.81)
RDW: 14.2 % (ref 11.5–15.5)
WBC: 20.5 10*3/uL — ABNORMAL HIGH (ref 4.0–10.5)
nRBC: 0.1 % (ref 0.0–0.2)

## 2022-10-27 NOTE — Discharge Summary (Addendum)
ELECTROPHYSIOLOGY PROCEDURE DISCHARGE SUMMARY    Patient ID: Alexander Maynard,  MRN: [id number], DOB/AGE: 78/12/1945 78 y.o.  Admit date: 10/26/2022 Discharge date: 10/27/2022  Primary Care Physician: Richardean Chimeraaniel, Terry G, MD Primary Cardiologist: Dr. Antoine PocheHochrein [phone]([id number]) Electrophysiologist: Dr. Elberta Fortisamnitz  Primary Discharge Diagnosis:  Paroxysmal Atrial fibrillation      CHA2DS2Vasc is 6, on xarelto  Secondary Discharge Diagnosis:  HLD Hypothyroidism DVT polycythemia  Procedures This Admission:  1.  Electrophysiology study and radiofrequency catheter ablation on by Dr Elberta Fortisamnitz on 10/28/22   This study demonstrated  CONCLUSIONS: 1. Sinus rhythm upon presentation.   2. Successful electrical isolation and anatomical encircling of all four pulmonary veins with radiofrequency current. 3. No inducible arrhythmias following ablation both on and off of dobutamine 4. No early apparent complications..    Brief HPI: Alexander Maynard is a 77 y.o. male with a history of paroxysmal atrial fibrillation.  Risks, benefits, and alternatives to catheter ablation of atrial fibrillation were reviewed with the patient who wished to proceed.     Hospital Course:  The patient was admitted and underwent EPS/RFCA of atrial fibrillation with details as outlined above.  He was planned for same day discharge though once his bedrest was completed and ambulated developed a R groin hematoma.  Pressure held by RN with improvement and [facility] for observation[facility] They were monitored on telemetry overnight which demonstrated SR, occ PVCs.  R Groin was stable, soft, nontender, no palpable hematoma, left groin remained without complication on the day of discharge.  The patient feels well today, denies CP, SOB, site discomfort.  He has ambulate without difficulty or procedure site pain.  Vascular US negative for pseudoaneurysm or AVF.  He was examined by Dr. Elberta Fortisamnitz and considered to be stable for discharge.  Wound  care and restrictions were reviewed with the patient.  The patient Acelyn Basham be seen back by the Afib clinic on Monday to recheck his site and again as usual in 4 weeks and Dr Elberta Fortisamnitz in 12 weeks for post ablation follow up.    Physical Exam: Vitals:   10/27/22 1000 10/27/22 1100 10/27/22 1108 10/27/22 1200  BP:   121/70   Pulse:   73   Resp: 17 (!) 24 18 (!) 24  Temp:   98.7 F (37.1 C)   TempSrc:   Oral   SpO2:   95%   Weight:      Height:        GEN- The patient is well appearing, alert and oriented x 3 today.   HEENT: normocephalic, atraumatic; sclera clear, conjunctiva pink; hearing intact; oropharynx clear; neck supple  Lungs-  CTA b/l, normal work of breathing.  No wheezes, rales, rhonchi Heart-  RRR, no murmurs, rubs or gallops  GI- soft, non-tender, non-distended, bowel sounds present  Extremities- no clubbing, cyanosis, or edema; R groin is soft, nontender, no bleeding or appreciable hematoma remains, L groin stable, no hematoma, bleeding, pain MS- no significant deformity or atrophy Skin- warm and dry, no rash or lesion Psych- euthymic mood, full affect Neuro- strength and sensation are intact   Labs:   Lab Results  Component Value Date   WBC 20.5 (H) 10/27/2022   HGB 11.8 (L) 10/27/2022   HCT 34.5 (L) 10/27/2022   MCV 107.8 (H) 10/27/2022   PLT 377 10/27/2022   No results for input(s): "NA", "K", "CL", "CO2", "BUN", "CREATININE", "CALCIUM", "PROT", "BILITOT", "ALKPHOS", "ALT", "AST", "GLUCOSE" in the last 168 hours.  Invalid  input(s): "LABALBU"   Discharge Medications:  Allergies as of 10/27/2022   No Known Allergies      Medication List     TAKE these medications    aspirin EC 81 MG tablet Take 81 mg by mouth daily.   flecainide 50 MG tablet Commonly known as: TAMBOCOR Take 1 tablet by mouth twice daily   gabapentin 300 MG capsule Commonly known as: NEURONTIN Take 300 mg by mouth 2 (two) times daily.   hydroxyurea 500 MG capsule Commonly known  as: HYDREA Take 1,000 mg by mouth daily. May take with food to minimize GI side effects.   levothyroxine 88 MCG tablet Commonly known as: SYNTHROID Take 88 mcg by mouth daily before breakfast.   metoprolol succinate 25 MG 24 hr tablet Commonly known as: TOPROL-XL TAKE 1 TABLET BY MOUTH ONCE DAILY AT BEDTIME .  TAKE  WITH  OR  IMMEDIATELY  FOLLOWING  A  MEAL What changed: See the new instructions.   multivitamin with minerals Tabs tablet Take 1 tablet by mouth daily.   omeprazole 20 MG capsule Commonly known as: PRILOSEC Take 20 mg by mouth daily.   rivaroxaban 20 MG Tabs tablet Commonly known as: XARELTO Take 20 mg by mouth every morning. Notes to patient: Resume Saturday 10/28/22   tamsulosin 0.4 MG Caps capsule Commonly known as: FLOMAX Take 0.4 mg by mouth in the morning and at bedtime.        Disposition: Home Discharge Instructions     Diet - low sodium heart healthy   Complete by: As directed    Increase activity slowly   Complete by: As directed         Duration of Discharge Encounter: Greater than 30 minutes including physician time.  Norma Fredrickson, PA-C 10/27/2022 2:03 PM  I have seen and examined this patient with Francis Dowse.  Agree with above, note added to reflect my findings.  Patient is status post atrial fibrillation ablation.  When he was getting out of bed, he developed a right groin hematoma.  Patient was admitted to the hospital for observation.  No further bleeding noted on femoral vein ultrasound.  Plan for discharge today with follow-up in clinic.  GEN: Well nourished, well developed, in no acute distress  HEENT: normal  Neck: no JVD, carotid bruits, or masses Cardiac: RRR; no murmurs, rubs, or gallops,no edema  Respiratory:  clear to auscultation bilaterally, normal work of breathing GI: soft, nontender, nondistended, + BS MS: no deformity or atrophy  Skin: warm and dry, hematoma, right leg Neuro:  Strength and sensation are  intact Psych: euthymic mood, full affect    Nhi Butrum M. Berneice Zettlemoyer MD 10/27/2022 3:27 PM

## 2022-10-27 NOTE — Anesthesia Postprocedure Evaluation (Signed)
Anesthesia Post Note  Patient: Alexander Maynard  Procedure(s) Performed: ATRIAL FIBRILLATION ABLATION     Patient location during evaluation: Cath Lab Anesthesia Type: General Level of consciousness: awake and alert Pain management: pain level controlled Vital Signs Assessment: post-procedure vital signs reviewed and stable Respiratory status: spontaneous breathing, nonlabored ventilation, respiratory function stable and patient connected to nasal cannula oxygen Cardiovascular status: blood pressure returned to baseline and stable Postop Assessment: no apparent nausea or vomiting Anesthetic complications: no   There were no known notable events for this encounter.  Last Vitals:  Vitals:   10/27/22 0800 10/27/22 0815  BP:  129/64  Pulse:  72  Resp: (!) 24 18  Temp:  36.9 C  SpO2:  95%    Last Pain:  Vitals:   10/27/22 0815  TempSrc: Oral  PainSc: 0-No pain                 Collene Schlichter

## 2022-10-30 ENCOUNTER — Ambulatory Visit (HOSPITAL_COMMUNITY)
Admit: 2022-10-30 | Discharge: 2022-10-30 | Disposition: A | Discharge status: Home / Self Care | Payer: Medicare Other | Attending: Internal Medicine | Admitting: Internal Medicine

## 2022-10-30 DIAGNOSIS — T148XXD Other injury of unspecified body region, subsequent encounter: Secondary | ICD-10-CM | POA: Insufficient documentation

## 2022-10-30 DIAGNOSIS — I480 Paroxysmal atrial fibrillation: Secondary | ICD-10-CM

## 2022-10-30 NOTE — Progress Notes (Signed)
Patient states his hematoma is similar in appearance to when he was discharged.   The hematoma is not worse in terms of size.  No induration, not hot to touch, no drainage. Distal pulse intact. He denies fever.  It is a little sore to touch, which is unchanged compared to discharge.  Patient alerted to call clinic in case signs of infection appear.   Follow up as scheduled in Afib clinic.

## 2022-11-20 DIAGNOSIS — E039 Hypothyroidism, unspecified: Secondary | ICD-10-CM | POA: Diagnosis not present

## 2022-11-20 DIAGNOSIS — E875 Hyperkalemia: Secondary | ICD-10-CM | POA: Diagnosis not present

## 2022-11-20 DIAGNOSIS — K219 Gastro-esophageal reflux disease without esophagitis: Secondary | ICD-10-CM | POA: Diagnosis not present

## 2022-11-20 DIAGNOSIS — Z0001 Encounter for general adult medical examination with abnormal findings: Secondary | ICD-10-CM | POA: Diagnosis not present

## 2022-11-20 DIAGNOSIS — E7849 Other hyperlipidemia: Secondary | ICD-10-CM | POA: Diagnosis not present

## 2022-11-20 DIAGNOSIS — D751 Secondary polycythemia: Secondary | ICD-10-CM | POA: Diagnosis not present

## 2022-11-23 ENCOUNTER — Ambulatory Visit (HOSPITAL_COMMUNITY)
Admission: RE | Admit: 2022-11-23 | Discharge: 2022-11-23 | Disposition: A | Discharge status: Home / Self Care | Payer: Medicare Other | Source: Ambulatory Visit | Attending: Physician Assistant | Admitting: Physician Assistant

## 2022-11-23 VITALS — BP 120/62 | HR 58 | Ht 71.0 in | Wt 206.0 lb

## 2022-11-23 DIAGNOSIS — I440 Atrioventricular block, first degree: Secondary | ICD-10-CM | POA: Insufficient documentation

## 2022-11-23 DIAGNOSIS — D6869 Other thrombophilia: Secondary | ICD-10-CM

## 2022-11-23 DIAGNOSIS — Z7901 Long term (current) use of anticoagulants: Secondary | ICD-10-CM | POA: Diagnosis not present

## 2022-11-23 DIAGNOSIS — I2510 Atherosclerotic heart disease of native coronary artery without angina pectoris: Secondary | ICD-10-CM | POA: Insufficient documentation

## 2022-11-23 DIAGNOSIS — E785 Hyperlipidemia, unspecified: Secondary | ICD-10-CM | POA: Diagnosis not present

## 2022-11-23 DIAGNOSIS — Z86718 Personal history of other venous thrombosis and embolism: Secondary | ICD-10-CM | POA: Diagnosis not present

## 2022-11-23 DIAGNOSIS — E039 Hypothyroidism, unspecified: Secondary | ICD-10-CM | POA: Diagnosis not present

## 2022-11-23 DIAGNOSIS — I4891 Unspecified atrial fibrillation: Secondary | ICD-10-CM | POA: Diagnosis present

## 2022-11-23 DIAGNOSIS — Z79899 Other long term (current) drug therapy: Secondary | ICD-10-CM | POA: Diagnosis not present

## 2022-11-23 DIAGNOSIS — I480 Paroxysmal atrial fibrillation: Secondary | ICD-10-CM | POA: Diagnosis not present

## 2022-11-23 NOTE — Progress Notes (Signed)
Primary Care Physician: Richardean Chimera, MD Primary Cardiologist: Dr Hubert Azure [facility]) Primary Electrophysiologist: Dr Elberta Fortis  Referring Physician: Dr Gasper Sells is a 77 y.o. 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  The patient was initially diagnosed with atrial fibrillation 07/2020. He wore a monitor which showed 14% afib burden. He does have symptoms of fatigue while in afib. Patient is on Xarelto for a CHADS2VASC score of 3. He was seen by Dr Elberta Fortis 05/15/22 and started on flecainide with plan for ablation. Patient is s/p afib ablation 10/26/22. He did develop a large hematoma at his R groin site. Patient kept in hospital overnight, vascular US negative for pseudoaneurysm. Groin check in office 10/30/22 was stable.   On follow up today, patient reports that he has done well since his last visit. He denies any interim symptoms of afib. His hematoma has resolved. He denies chest pain or swallowing pain. No bleeding issues on anticoagulation.   Today, he denies symptoms of palpitations, chest pain, shortness of breath, orthopnea, PND, lower extremity edema, dizziness, presyncope, syncope, snoring, daytime somnolence, bleeding, or neurologic sequela. The patient is tolerating medications without difficulties and is otherwise without complaint today.    Atrial Fibrillation Risk Factors:  he does not have symptoms or diagnosis of sleep apnea. he does not have a history of rheumatic fever.   he has a BMI of Body mass index is 28.73 kg/m.Marland Kitchen Filed Weights   11/23/22 1442  Weight: 93.4 kg   Family History  Problem Relation Age of Onset   Pneumonia Mother    Heart attack Father 87   Prostate cancer Brother    Chronic Renal Failure Brother      Atrial Fibrillation Management history:  Previous antiarrhythmic drugs:  flecainide Previous cardioversions: none Previous ablations: 10/26/22 CHADS2VASC score: 3 Anticoagulation history: Xarelto   Past Medical History:  Diagnosis Date   Enlarged prostate without lower urinary tract symptoms (luts)    Essential thrombocythemia (HCC)    Hyperlipidemia    Hypothyroidism    Male erectile disorder    Other specified polyneuropathies    Polycythemia, secondary    Restless leg syndrome    Venous thrombosis    There is a mention of this in his notes.  He is on chronic anticoagulation.   Past Surgical History:  Procedure Laterality Date   AMPUTATION OF SECOND TOE     ATRIAL FIBRILLATION ABLATION N/A 10/26/2022   Procedure: ATRIAL FIBRILLATION ABLATION;  Surgeon: Regan Lemming, MD;  Location: [facility] INV[facility];  Service: Cardiovascular;  Laterality: N/A;   CATARACT EXTRACTION Left 09/2020   CATARACT EXTRACTION W/PHACO Left 10/08/2020   Procedure: CATARACT EXTRACTION PHACO AND INTRAOCULAR LENS PLACEMENT LEFT EYE;  Surgeon: Fabio Pierce, MD;  Location: [facility] ORS;  Service: Ophthalmology;  Laterality: Left;  CDE 23.63   CATARACT EXTRACTION W/PHACO Right 10/22/2020   Procedure: CATARACT EXTRACTION PHACO AND INTRAOCULAR LENS PLACEMENT (IOC);  Surgeon: Fabio Pierce, MD;  Location: [facility] OR[facility];  Service: Ophthalmology;  Laterality: Right;  CDE: 16.83    COLONOSCOPY  2009   INGUINAL HERNIA REPAIR  1998   LARYNGEAL POLYP     TONSILLECTOMY     TRANSURETHRAL RESECTION OF PROSTATE  2016   UPPER GASTROINTESTINAL ENDOSCOPY      Current Outpatient Medications  Medication Sig Dispense Refill   aspirin 81 MG EC tablet  Take 81 mg by mouth daily.     flecainide (TAMBOCOR) 50 MG tablet Take 1 tablet by mouth twice daily 180 tablet 2   gabapentin (NEURONTIN) 300 MG capsule Take 300 mg by mouth 2 (two) times daily.     hydroxyurea (HYDREA) 500 MG capsule Take 1,000 mg by mouth daily. May take with food to minimize GI side effects.     levothyroxine (SYNTHROID) 88 MCG tablet  Take 88 mcg by mouth daily before breakfast.     metoprolol succinate (TOPROL-XL) 25 MG 24 hr tablet TAKE 1 TABLET BY MOUTH ONCE DAILY AT BEDTIME .  TAKE  WITH  OR  IMMEDIATELY  FOLLOWING  A  MEAL (Patient taking differently: Take 25 mg by mouth at bedtime.) 90 tablet 2   Multiple Vitamin (MULTIVITAMIN WITH MINERALS) TABS tablet Take 1 tablet by mouth daily.     omeprazole (PRILOSEC) 20 MG capsule Take 20 mg by mouth daily.     rivaroxaban (XARELTO) 20 MG TABS tablet Take 20 mg by mouth every morning.     tamsulosin (FLOMAX) 0.4 MG CAPS capsule Take 0.4 mg by mouth in the morning and at bedtime.     No current facility-administered medications for this encounter.    No Known Allergies  Social History   Socioeconomic History   Marital status: Married    Spouse name: Not on file   Number of children: Not on file   Years of education: Not on file   Highest education level: Not on file  Occupational History   Not on file  Tobacco Use   Smoking status: Former    Types: Cigarettes   Smokeless tobacco: Never  Vaping Use   Vaping Use: Never used  Substance and Sexual Activity   Alcohol use: Never   Drug use: Never   Sexual activity: Not on file  Other Topics Concern   Not on file  Social History Narrative   Lives with wife.    Social Determinants of Health   Financial Resource Strain: Not on file  Food Insecurity: Not on file  Transportation Needs: Not on file  Physical Activity: Not on file  Stress: Not on file  Social Connections: Not on file  Intimate Partner Violence: Not on file     ROS- All systems are reviewed and negative except as per the HPI above.  Physical Exam: Vitals:   11/23/22 1442  BP: 120/62  Pulse: (!) 58  Weight: 93.4 kg  Height: 5\' 11"  (1.803 m)     GEN- The patient is a well appearing elderly male, alert and oriented x 3 today.   HEENT-head normocephalic, atraumatic, sclera clear, conjunctiva pink, hearing intact, trachea midline. Lungs-  Clear to ausculation bilaterally, normal work of breathing Heart- Regular rate and rhythm, no murmurs, rubs or gallops  GI- soft, NT, ND, + BS Extremities- no clubbing, cyanosis, or edema MS- no significant deformity or atrophy Skin- no rash or lesion Psych- euthymic mood, full affect Neuro- strength and sensation are intact   Wt Readings from Last 3 Encounters:  11/23/22 93.4 kg  10/26/22 86.2 kg  10/12/22 94.8 kg    EKG today demonstrates  SB, 1st degree AV block, LAFB Vent. rate 58 BPM PR interval 218 ms QRS duration 114 ms QT/QTcB 444/435 ms  Echo 03/03/20 demonstrated  Left ventricular ejection fraction 60% Grade 1 diastolic dysfunction mild mitral annular calcification Mild to moderate aortic regurgitation  Mild to moderately dilated left atrium Right ventricle normal in size and  function Dilated aortic root, 4.2 cm  Epic records are reviewed at length today  CHA2DS2-VASc Score = 3  The patient's score is based upon: CHF History: 0 HTN History: 0 Diabetes History: 0 Stroke History: 0 Vascular Disease History: 1 (elevated calcium score on CT) Age Score: 2 Gender Score: 0        ASSESSMENT AND PLAN: 1. Paroxysmal Atrial Fibrillation (ICD10:  I48.0) The patient's CHA2DS2-VASc score is 3, indicating a 3.2% annual risk of stroke.   S/p afib ablation 10/26/22 Patient appears to be maintaining SR.  Continue flecainide 50 mg BID for now. Hopefully will be able to discontinue at follow up. Continue Toprol 25 mg daily Continue Xarelto 20 mg daily with no missed doses for 3 months post ablation.   2. Secondary Hypercoagulable State (ICD10:  D68.69) The patient is at significant risk for stroke/thromboembolism based upon his CHA2DS2-VASc Score of 3.  Continue Rivaroxaban (Xarelto).   3. CAD CAC score 199 on CT No anginal symptoms.   Follow up with Dr Elberta Fortis as scheduled.    Jorja Loa PA-C Afib [facility] [address] [place], [place] [address] [phone] 11/23/2022 2:54 PM

## 2022-11-28 DIAGNOSIS — Z23 Encounter for immunization: Secondary | ICD-10-CM | POA: Diagnosis not present

## 2022-11-28 DIAGNOSIS — E039 Hypothyroidism, unspecified: Secondary | ICD-10-CM | POA: Diagnosis not present

## 2022-11-28 DIAGNOSIS — G2581 Restless legs syndrome: Secondary | ICD-10-CM | POA: Diagnosis not present

## 2022-11-28 DIAGNOSIS — D751 Secondary polycythemia: Secondary | ICD-10-CM | POA: Diagnosis not present

## 2022-11-28 DIAGNOSIS — I4891 Unspecified atrial fibrillation: Secondary | ICD-10-CM | POA: Diagnosis not present

## 2022-11-28 DIAGNOSIS — G6289 Other specified polyneuropathies: Secondary | ICD-10-CM | POA: Diagnosis not present

## 2022-11-28 DIAGNOSIS — R030 Elevated blood-pressure reading, without diagnosis of hypertension: Secondary | ICD-10-CM | POA: Diagnosis not present

## 2022-11-28 DIAGNOSIS — E7849 Other hyperlipidemia: Secondary | ICD-10-CM | POA: Diagnosis not present

## 2022-11-28 DIAGNOSIS — Z0001 Encounter for general adult medical examination with abnormal findings: Secondary | ICD-10-CM | POA: Diagnosis not present

## 2022-11-28 DIAGNOSIS — D473 Essential (hemorrhagic) thrombocythemia: Secondary | ICD-10-CM | POA: Diagnosis not present

## 2022-12-19 DIAGNOSIS — M47812 Spondylosis without myelopathy or radiculopathy, cervical region: Secondary | ICD-10-CM | POA: Diagnosis not present

## 2022-12-19 DIAGNOSIS — M9901 Segmental and somatic dysfunction of cervical region: Secondary | ICD-10-CM | POA: Diagnosis not present

## 2022-12-19 DIAGNOSIS — M5032 Other cervical disc degeneration, mid-cervical region, unspecified level: Secondary | ICD-10-CM | POA: Diagnosis not present

## 2022-12-21 DIAGNOSIS — M9901 Segmental and somatic dysfunction of cervical region: Secondary | ICD-10-CM | POA: Diagnosis not present

## 2022-12-21 DIAGNOSIS — M47812 Spondylosis without myelopathy or radiculopathy, cervical region: Secondary | ICD-10-CM | POA: Diagnosis not present

## 2022-12-21 DIAGNOSIS — M5032 Other cervical disc degeneration, mid-cervical region, unspecified level: Secondary | ICD-10-CM | POA: Diagnosis not present

## 2022-12-25 DIAGNOSIS — M47812 Spondylosis without myelopathy or radiculopathy, cervical region: Secondary | ICD-10-CM | POA: Diagnosis not present

## 2022-12-25 DIAGNOSIS — M9901 Segmental and somatic dysfunction of cervical region: Secondary | ICD-10-CM | POA: Diagnosis not present

## 2022-12-25 DIAGNOSIS — M5032 Other cervical disc degeneration, mid-cervical region, unspecified level: Secondary | ICD-10-CM | POA: Diagnosis not present

## 2022-12-25 DIAGNOSIS — I480 Paroxysmal atrial fibrillation: Secondary | ICD-10-CM | POA: Diagnosis not present

## 2022-12-28 DIAGNOSIS — M9901 Segmental and somatic dysfunction of cervical region: Secondary | ICD-10-CM | POA: Diagnosis not present

## 2022-12-28 DIAGNOSIS — M47812 Spondylosis without myelopathy or radiculopathy, cervical region: Secondary | ICD-10-CM | POA: Diagnosis not present

## 2022-12-28 DIAGNOSIS — M5032 Other cervical disc degeneration, mid-cervical region, unspecified level: Secondary | ICD-10-CM | POA: Diagnosis not present

## 2023-01-04 DIAGNOSIS — M9901 Segmental and somatic dysfunction of cervical region: Secondary | ICD-10-CM | POA: Diagnosis not present

## 2023-01-04 DIAGNOSIS — M47812 Spondylosis without myelopathy or radiculopathy, cervical region: Secondary | ICD-10-CM | POA: Diagnosis not present

## 2023-01-04 DIAGNOSIS — M5032 Other cervical disc degeneration, mid-cervical region, unspecified level: Secondary | ICD-10-CM | POA: Diagnosis not present

## 2023-01-16 DIAGNOSIS — M79671 Pain in right foot: Secondary | ICD-10-CM | POA: Diagnosis not present

## 2023-01-16 DIAGNOSIS — E1140 Type 2 diabetes mellitus with diabetic neuropathy, unspecified: Secondary | ICD-10-CM | POA: Diagnosis not present

## 2023-01-16 DIAGNOSIS — E1151 Type 2 diabetes mellitus with diabetic peripheral angiopathy without gangrene: Secondary | ICD-10-CM | POA: Diagnosis not present

## 2023-01-16 DIAGNOSIS — M79674 Pain in right toe(s): Secondary | ICD-10-CM | POA: Diagnosis not present

## 2023-01-16 DIAGNOSIS — L89893 Pressure ulcer of other site, stage 3: Secondary | ICD-10-CM | POA: Diagnosis not present

## 2023-01-18 DIAGNOSIS — M9901 Segmental and somatic dysfunction of cervical region: Secondary | ICD-10-CM | POA: Diagnosis not present

## 2023-01-18 DIAGNOSIS — M47812 Spondylosis without myelopathy or radiculopathy, cervical region: Secondary | ICD-10-CM | POA: Diagnosis not present

## 2023-01-18 DIAGNOSIS — M5032 Other cervical disc degeneration, mid-cervical region, unspecified level: Secondary | ICD-10-CM | POA: Diagnosis not present

## 2023-02-05 DIAGNOSIS — L110 Acquired keratosis follicularis: Secondary | ICD-10-CM | POA: Diagnosis not present

## 2023-02-05 DIAGNOSIS — L89892 Pressure ulcer of other site, stage 2: Secondary | ICD-10-CM | POA: Diagnosis not present

## 2023-02-05 DIAGNOSIS — E1151 Type 2 diabetes mellitus with diabetic peripheral angiopathy without gangrene: Secondary | ICD-10-CM | POA: Diagnosis not present

## 2023-02-05 DIAGNOSIS — E1140 Type 2 diabetes mellitus with diabetic neuropathy, unspecified: Secondary | ICD-10-CM | POA: Diagnosis not present

## 2023-02-05 DIAGNOSIS — M79674 Pain in right toe(s): Secondary | ICD-10-CM | POA: Diagnosis not present

## 2023-02-05 DIAGNOSIS — M79671 Pain in right foot: Secondary | ICD-10-CM | POA: Diagnosis not present

## 2023-02-07 ENCOUNTER — Ambulatory Visit: Payer: Medicare Other | Admitting: Cardiology

## 2023-02-19 DIAGNOSIS — M47812 Spondylosis without myelopathy or radiculopathy, cervical region: Secondary | ICD-10-CM | POA: Diagnosis not present

## 2023-02-19 DIAGNOSIS — M5032 Other cervical disc degeneration, mid-cervical region, unspecified level: Secondary | ICD-10-CM | POA: Diagnosis not present

## 2023-02-19 DIAGNOSIS — M9901 Segmental and somatic dysfunction of cervical region: Secondary | ICD-10-CM | POA: Diagnosis not present

## 2023-03-05 DIAGNOSIS — M5032 Other cervical disc degeneration, mid-cervical region, unspecified level: Secondary | ICD-10-CM | POA: Diagnosis not present

## 2023-03-05 DIAGNOSIS — M9901 Segmental and somatic dysfunction of cervical region: Secondary | ICD-10-CM | POA: Diagnosis not present

## 2023-03-05 DIAGNOSIS — M47812 Spondylosis without myelopathy or radiculopathy, cervical region: Secondary | ICD-10-CM | POA: Diagnosis not present

## 2023-03-23 DIAGNOSIS — E782 Mixed hyperlipidemia: Secondary | ICD-10-CM | POA: Diagnosis not present

## 2023-03-23 DIAGNOSIS — G6289 Other specified polyneuropathies: Secondary | ICD-10-CM | POA: Diagnosis not present

## 2023-03-23 DIAGNOSIS — E039 Hypothyroidism, unspecified: Secondary | ICD-10-CM | POA: Diagnosis not present

## 2023-03-23 DIAGNOSIS — D473 Essential (hemorrhagic) thrombocythemia: Secondary | ICD-10-CM | POA: Diagnosis not present

## 2023-03-23 DIAGNOSIS — K219 Gastro-esophageal reflux disease without esophagitis: Secondary | ICD-10-CM | POA: Diagnosis not present

## 2023-03-23 DIAGNOSIS — I480 Paroxysmal atrial fibrillation: Secondary | ICD-10-CM | POA: Diagnosis not present

## 2023-03-27 DIAGNOSIS — E875 Hyperkalemia: Secondary | ICD-10-CM | POA: Diagnosis not present

## 2023-03-27 DIAGNOSIS — D649 Anemia, unspecified: Secondary | ICD-10-CM | POA: Diagnosis not present

## 2023-03-27 DIAGNOSIS — E7849 Other hyperlipidemia: Secondary | ICD-10-CM | POA: Diagnosis not present

## 2023-03-27 DIAGNOSIS — D751 Secondary polycythemia: Secondary | ICD-10-CM | POA: Diagnosis not present

## 2023-04-02 DIAGNOSIS — M9901 Segmental and somatic dysfunction of cervical region: Secondary | ICD-10-CM | POA: Diagnosis not present

## 2023-04-02 DIAGNOSIS — M47812 Spondylosis without myelopathy or radiculopathy, cervical region: Secondary | ICD-10-CM | POA: Diagnosis not present

## 2023-04-02 DIAGNOSIS — M5032 Other cervical disc degeneration, mid-cervical region, unspecified level: Secondary | ICD-10-CM | POA: Diagnosis not present

## 2023-04-05 DIAGNOSIS — E039 Hypothyroidism, unspecified: Secondary | ICD-10-CM | POA: Diagnosis not present

## 2023-04-05 DIAGNOSIS — D751 Secondary polycythemia: Secondary | ICD-10-CM | POA: Diagnosis not present

## 2023-04-05 DIAGNOSIS — G6289 Other specified polyneuropathies: Secondary | ICD-10-CM | POA: Diagnosis not present

## 2023-04-05 DIAGNOSIS — M79671 Pain in right foot: Secondary | ICD-10-CM | POA: Diagnosis not present

## 2023-04-05 DIAGNOSIS — M79674 Pain in right toe(s): Secondary | ICD-10-CM | POA: Diagnosis not present

## 2023-04-05 DIAGNOSIS — I4891 Unspecified atrial fibrillation: Secondary | ICD-10-CM | POA: Diagnosis not present

## 2023-04-05 DIAGNOSIS — G2581 Restless legs syndrome: Secondary | ICD-10-CM | POA: Diagnosis not present

## 2023-04-05 DIAGNOSIS — R030 Elevated blood-pressure reading, without diagnosis of hypertension: Secondary | ICD-10-CM | POA: Diagnosis not present

## 2023-04-05 DIAGNOSIS — L03115 Cellulitis of right lower limb: Secondary | ICD-10-CM | POA: Diagnosis not present

## 2023-04-05 DIAGNOSIS — L89893 Pressure ulcer of other site, stage 3: Secondary | ICD-10-CM | POA: Diagnosis not present

## 2023-04-05 DIAGNOSIS — E1151 Type 2 diabetes mellitus with diabetic peripheral angiopathy without gangrene: Secondary | ICD-10-CM | POA: Diagnosis not present

## 2023-04-05 DIAGNOSIS — E7849 Other hyperlipidemia: Secondary | ICD-10-CM | POA: Diagnosis not present

## 2023-04-05 DIAGNOSIS — E1140 Type 2 diabetes mellitus with diabetic neuropathy, unspecified: Secondary | ICD-10-CM | POA: Diagnosis not present

## 2023-04-05 DIAGNOSIS — L110 Acquired keratosis follicularis: Secondary | ICD-10-CM | POA: Diagnosis not present

## 2023-04-05 DIAGNOSIS — L89892 Pressure ulcer of other site, stage 2: Secondary | ICD-10-CM | POA: Diagnosis not present

## 2023-04-05 DIAGNOSIS — D473 Essential (hemorrhagic) thrombocythemia: Secondary | ICD-10-CM | POA: Diagnosis not present

## 2023-04-11 DIAGNOSIS — R600 Localized edema: Secondary | ICD-10-CM | POA: Diagnosis not present

## 2023-04-11 DIAGNOSIS — Z7901 Long term (current) use of anticoagulants: Secondary | ICD-10-CM | POA: Diagnosis not present

## 2023-04-11 DIAGNOSIS — L03115 Cellulitis of right lower limb: Secondary | ICD-10-CM | POA: Diagnosis not present

## 2023-04-11 DIAGNOSIS — I480 Paroxysmal atrial fibrillation: Secondary | ICD-10-CM | POA: Diagnosis not present

## 2023-04-11 DIAGNOSIS — M79674 Pain in right toe(s): Secondary | ICD-10-CM | POA: Diagnosis not present

## 2023-04-11 DIAGNOSIS — I4821 Permanent atrial fibrillation: Secondary | ICD-10-CM | POA: Diagnosis not present

## 2023-04-11 DIAGNOSIS — E1140 Type 2 diabetes mellitus with diabetic neuropathy, unspecified: Secondary | ICD-10-CM | POA: Diagnosis not present

## 2023-04-11 DIAGNOSIS — Z87891 Personal history of nicotine dependence: Secondary | ICD-10-CM | POA: Diagnosis not present

## 2023-04-11 DIAGNOSIS — G4733 Obstructive sleep apnea (adult) (pediatric): Secondary | ICD-10-CM | POA: Diagnosis not present

## 2023-04-11 DIAGNOSIS — M19071 Primary osteoarthritis, right ankle and foot: Secondary | ICD-10-CM | POA: Diagnosis not present

## 2023-04-11 DIAGNOSIS — I10 Essential (primary) hypertension: Secondary | ICD-10-CM | POA: Diagnosis not present

## 2023-04-11 DIAGNOSIS — Z79899 Other long term (current) drug therapy: Secondary | ICD-10-CM | POA: Diagnosis not present

## 2023-04-11 DIAGNOSIS — Z9841 Cataract extraction status, right eye: Secondary | ICD-10-CM | POA: Diagnosis not present

## 2023-04-11 DIAGNOSIS — Z961 Presence of intraocular lens: Secondary | ICD-10-CM | POA: Diagnosis not present

## 2023-04-11 DIAGNOSIS — L089 Local infection of the skin and subcutaneous tissue, unspecified: Secondary | ICD-10-CM | POA: Diagnosis not present

## 2023-04-11 DIAGNOSIS — Z9842 Cataract extraction status, left eye: Secondary | ICD-10-CM | POA: Diagnosis not present

## 2023-04-11 DIAGNOSIS — I890 Lymphedema, not elsewhere classified: Secondary | ICD-10-CM | POA: Diagnosis not present

## 2023-04-11 DIAGNOSIS — G603 Idiopathic progressive neuropathy: Secondary | ICD-10-CM | POA: Diagnosis not present

## 2023-04-11 DIAGNOSIS — E1151 Type 2 diabetes mellitus with diabetic peripheral angiopathy without gangrene: Secondary | ICD-10-CM | POA: Diagnosis not present

## 2023-04-11 DIAGNOSIS — D45 Polycythemia vera: Secondary | ICD-10-CM | POA: Diagnosis not present

## 2023-04-11 DIAGNOSIS — M79671 Pain in right foot: Secondary | ICD-10-CM | POA: Diagnosis not present

## 2023-04-11 DIAGNOSIS — L97519 Non-pressure chronic ulcer of other part of right foot with unspecified severity: Secondary | ICD-10-CM | POA: Diagnosis not present

## 2023-04-11 DIAGNOSIS — L03031 Cellulitis of right toe: Secondary | ICD-10-CM | POA: Diagnosis not present

## 2023-04-11 DIAGNOSIS — E079 Disorder of thyroid, unspecified: Secondary | ICD-10-CM | POA: Diagnosis not present

## 2023-04-11 DIAGNOSIS — M869 Osteomyelitis, unspecified: Secondary | ICD-10-CM | POA: Diagnosis not present

## 2023-04-11 DIAGNOSIS — D469 Myelodysplastic syndrome, unspecified: Secondary | ICD-10-CM | POA: Diagnosis not present

## 2023-04-11 DIAGNOSIS — M7989 Other specified soft tissue disorders: Secondary | ICD-10-CM | POA: Diagnosis not present

## 2023-04-11 DIAGNOSIS — M86171 Other acute osteomyelitis, right ankle and foot: Secondary | ICD-10-CM | POA: Diagnosis not present

## 2023-04-11 DIAGNOSIS — Z7982 Long term (current) use of aspirin: Secondary | ICD-10-CM | POA: Diagnosis not present

## 2023-04-11 DIAGNOSIS — L02611 Cutaneous abscess of right foot: Secondary | ICD-10-CM | POA: Diagnosis not present

## 2023-04-11 DIAGNOSIS — Z86718 Personal history of other venous thrombosis and embolism: Secondary | ICD-10-CM | POA: Diagnosis not present

## 2023-04-11 DIAGNOSIS — K219 Gastro-esophageal reflux disease without esophagitis: Secondary | ICD-10-CM | POA: Diagnosis not present

## 2023-04-11 DIAGNOSIS — Z89421 Acquired absence of other right toe(s): Secondary | ICD-10-CM | POA: Diagnosis not present

## 2023-04-17 DIAGNOSIS — L02611 Cutaneous abscess of right foot: Secondary | ICD-10-CM | POA: Diagnosis not present

## 2023-04-17 DIAGNOSIS — K219 Gastro-esophageal reflux disease without esophagitis: Secondary | ICD-10-CM | POA: Diagnosis not present

## 2023-04-17 DIAGNOSIS — I4821 Permanent atrial fibrillation: Secondary | ICD-10-CM | POA: Diagnosis not present

## 2023-04-17 DIAGNOSIS — I10 Essential (primary) hypertension: Secondary | ICD-10-CM | POA: Diagnosis not present

## 2023-04-17 DIAGNOSIS — L03031 Cellulitis of right toe: Secondary | ICD-10-CM | POA: Diagnosis not present

## 2023-04-17 DIAGNOSIS — G4733 Obstructive sleep apnea (adult) (pediatric): Secondary | ICD-10-CM | POA: Diagnosis not present

## 2023-04-17 DIAGNOSIS — Z7901 Long term (current) use of anticoagulants: Secondary | ICD-10-CM | POA: Diagnosis not present

## 2023-04-17 DIAGNOSIS — D469 Myelodysplastic syndrome, unspecified: Secondary | ICD-10-CM | POA: Diagnosis not present

## 2023-04-17 DIAGNOSIS — Z452 Encounter for adjustment and management of vascular access device: Secondary | ICD-10-CM | POA: Diagnosis not present

## 2023-04-17 DIAGNOSIS — G629 Polyneuropathy, unspecified: Secondary | ICD-10-CM | POA: Diagnosis not present

## 2023-04-17 DIAGNOSIS — B9689 Other specified bacterial agents as the cause of diseases classified elsewhere: Secondary | ICD-10-CM | POA: Diagnosis not present

## 2023-04-17 DIAGNOSIS — M86171 Other acute osteomyelitis, right ankle and foot: Secondary | ICD-10-CM | POA: Diagnosis not present

## 2023-04-17 DIAGNOSIS — Z792 Long term (current) use of antibiotics: Secondary | ICD-10-CM | POA: Diagnosis not present

## 2023-04-20 DIAGNOSIS — K219 Gastro-esophageal reflux disease without esophagitis: Secondary | ICD-10-CM | POA: Diagnosis not present

## 2023-04-20 DIAGNOSIS — I10 Essential (primary) hypertension: Secondary | ICD-10-CM | POA: Diagnosis not present

## 2023-04-20 DIAGNOSIS — Z792 Long term (current) use of antibiotics: Secondary | ICD-10-CM | POA: Diagnosis not present

## 2023-04-20 DIAGNOSIS — L03031 Cellulitis of right toe: Secondary | ICD-10-CM | POA: Diagnosis not present

## 2023-04-20 DIAGNOSIS — M86171 Other acute osteomyelitis, right ankle and foot: Secondary | ICD-10-CM | POA: Diagnosis not present

## 2023-04-20 DIAGNOSIS — G629 Polyneuropathy, unspecified: Secondary | ICD-10-CM | POA: Diagnosis not present

## 2023-04-20 DIAGNOSIS — L02611 Cutaneous abscess of right foot: Secondary | ICD-10-CM | POA: Diagnosis not present

## 2023-04-20 DIAGNOSIS — D469 Myelodysplastic syndrome, unspecified: Secondary | ICD-10-CM | POA: Diagnosis not present

## 2023-04-20 DIAGNOSIS — Z452 Encounter for adjustment and management of vascular access device: Secondary | ICD-10-CM | POA: Diagnosis not present

## 2023-04-20 DIAGNOSIS — G4733 Obstructive sleep apnea (adult) (pediatric): Secondary | ICD-10-CM | POA: Diagnosis not present

## 2023-04-20 DIAGNOSIS — I4821 Permanent atrial fibrillation: Secondary | ICD-10-CM | POA: Diagnosis not present

## 2023-04-20 DIAGNOSIS — B9689 Other specified bacterial agents as the cause of diseases classified elsewhere: Secondary | ICD-10-CM | POA: Diagnosis not present

## 2023-04-20 DIAGNOSIS — Z7901 Long term (current) use of anticoagulants: Secondary | ICD-10-CM | POA: Diagnosis not present

## 2023-04-21 DIAGNOSIS — M19071 Primary osteoarthritis, right ankle and foot: Secondary | ICD-10-CM | POA: Diagnosis not present

## 2023-04-21 DIAGNOSIS — I4821 Permanent atrial fibrillation: Secondary | ICD-10-CM | POA: Diagnosis not present

## 2023-04-21 DIAGNOSIS — C946 Myelodysplastic disease, not classified: Secondary | ICD-10-CM | POA: Diagnosis not present

## 2023-04-21 DIAGNOSIS — I10 Essential (primary) hypertension: Secondary | ICD-10-CM | POA: Diagnosis not present

## 2023-04-21 DIAGNOSIS — Z792 Long term (current) use of antibiotics: Secondary | ICD-10-CM | POA: Diagnosis not present

## 2023-04-21 DIAGNOSIS — B9689 Other specified bacterial agents as the cause of diseases classified elsewhere: Secondary | ICD-10-CM | POA: Diagnosis not present

## 2023-04-21 DIAGNOSIS — Z87891 Personal history of nicotine dependence: Secondary | ICD-10-CM | POA: Diagnosis not present

## 2023-04-21 DIAGNOSIS — L02611 Cutaneous abscess of right foot: Secondary | ICD-10-CM | POA: Diagnosis not present

## 2023-04-21 DIAGNOSIS — Z452 Encounter for adjustment and management of vascular access device: Secondary | ICD-10-CM | POA: Diagnosis not present

## 2023-04-21 DIAGNOSIS — M86171 Other acute osteomyelitis, right ankle and foot: Secondary | ICD-10-CM | POA: Diagnosis not present

## 2023-04-21 DIAGNOSIS — L03119 Cellulitis of unspecified part of limb: Secondary | ICD-10-CM | POA: Diagnosis not present

## 2023-04-21 DIAGNOSIS — G629 Polyneuropathy, unspecified: Secondary | ICD-10-CM | POA: Diagnosis not present

## 2023-04-21 DIAGNOSIS — Z7901 Long term (current) use of anticoagulants: Secondary | ICD-10-CM | POA: Diagnosis not present

## 2023-04-21 DIAGNOSIS — D469 Myelodysplastic syndrome, unspecified: Secondary | ICD-10-CM | POA: Diagnosis not present

## 2023-04-21 DIAGNOSIS — L03115 Cellulitis of right lower limb: Secondary | ICD-10-CM | POA: Diagnosis not present

## 2023-04-21 DIAGNOSIS — M799 Soft tissue disorder, unspecified: Secondary | ICD-10-CM | POA: Diagnosis not present

## 2023-04-21 DIAGNOSIS — M8580 Other specified disorders of bone density and structure, unspecified site: Secondary | ICD-10-CM | POA: Diagnosis not present

## 2023-04-21 DIAGNOSIS — Z8739 Personal history of other diseases of the musculoskeletal system and connective tissue: Secondary | ICD-10-CM | POA: Diagnosis not present

## 2023-04-21 DIAGNOSIS — E079 Disorder of thyroid, unspecified: Secondary | ICD-10-CM | POA: Diagnosis not present

## 2023-04-21 DIAGNOSIS — L03031 Cellulitis of right toe: Secondary | ICD-10-CM | POA: Diagnosis not present

## 2023-04-21 DIAGNOSIS — G4733 Obstructive sleep apnea (adult) (pediatric): Secondary | ICD-10-CM | POA: Diagnosis not present

## 2023-04-21 DIAGNOSIS — K219 Gastro-esophageal reflux disease without esophagitis: Secondary | ICD-10-CM | POA: Diagnosis not present

## 2023-04-21 DIAGNOSIS — S91301A Unspecified open wound, right foot, initial encounter: Secondary | ICD-10-CM | POA: Diagnosis not present

## 2023-04-25 DIAGNOSIS — G4733 Obstructive sleep apnea (adult) (pediatric): Secondary | ICD-10-CM | POA: Diagnosis not present

## 2023-04-25 DIAGNOSIS — G629 Polyneuropathy, unspecified: Secondary | ICD-10-CM | POA: Diagnosis not present

## 2023-04-25 DIAGNOSIS — Z87891 Personal history of nicotine dependence: Secondary | ICD-10-CM | POA: Diagnosis not present

## 2023-04-25 DIAGNOSIS — D469 Myelodysplastic syndrome, unspecified: Secondary | ICD-10-CM | POA: Diagnosis not present

## 2023-04-25 DIAGNOSIS — I10 Essential (primary) hypertension: Secondary | ICD-10-CM | POA: Diagnosis not present

## 2023-04-25 DIAGNOSIS — Z792 Long term (current) use of antibiotics: Secondary | ICD-10-CM | POA: Diagnosis not present

## 2023-04-25 DIAGNOSIS — L03031 Cellulitis of right toe: Secondary | ICD-10-CM | POA: Diagnosis not present

## 2023-04-25 DIAGNOSIS — Z7901 Long term (current) use of anticoagulants: Secondary | ICD-10-CM | POA: Diagnosis not present

## 2023-04-25 DIAGNOSIS — I4891 Unspecified atrial fibrillation: Secondary | ICD-10-CM | POA: Diagnosis not present

## 2023-04-25 DIAGNOSIS — L02611 Cutaneous abscess of right foot: Secondary | ICD-10-CM | POA: Diagnosis not present

## 2023-04-25 DIAGNOSIS — K219 Gastro-esophageal reflux disease without esophagitis: Secondary | ICD-10-CM | POA: Diagnosis not present

## 2023-04-25 DIAGNOSIS — M86171 Other acute osteomyelitis, right ankle and foot: Secondary | ICD-10-CM | POA: Diagnosis not present

## 2023-04-25 DIAGNOSIS — Z452 Encounter for adjustment and management of vascular access device: Secondary | ICD-10-CM | POA: Diagnosis not present

## 2023-04-25 DIAGNOSIS — I4821 Permanent atrial fibrillation: Secondary | ICD-10-CM | POA: Diagnosis not present

## 2023-04-25 DIAGNOSIS — B9689 Other specified bacterial agents as the cause of diseases classified elsewhere: Secondary | ICD-10-CM | POA: Diagnosis not present

## 2023-04-26 DIAGNOSIS — M86171 Other acute osteomyelitis, right ankle and foot: Secondary | ICD-10-CM | POA: Diagnosis not present

## 2023-04-26 DIAGNOSIS — E11621 Type 2 diabetes mellitus with foot ulcer: Secondary | ICD-10-CM | POA: Diagnosis not present

## 2023-04-26 NOTE — Progress Notes (Deleted)
Electrophysiology Office Note:   Date:  04/26/2023  ID:  Alexander Maynard, DOB 23-May-1946, MRN [id number]  Primary Cardiologist: Rollene Rotunda, MD Electrophysiologist: Reyann Troop Jorja Loa, MD  {Click to update primary MD,subspecialty MD or APP then REFRESH:1}    History of Present Illness:   Alexander Maynard is a 77 y.o. male with h/o atrial fibrillation post ablation 10/26/2022, thyroid disease, unprovoked DVT seen today for routine electrophysiology followup.   Since last being seen in our clinic the patient reports doing ***.  he denies chest pain, palpitations, dyspnea, PND, orthopnea, nausea, vomiting, dizziness, syncope, edema, weight gain, or early satiety.   Review of systems complete and found to be negative unless listed in HPI.   EP Information / Studies Reviewed:    {EKGtoday:28818}      ***  Risk Assessment/Calculations:    CHA2DS2-VASc Score = 3  {Confirm score is correct.  If not, click here to update score.  REFRESH note.  :1} This indicates a 3.2% annual risk of stroke. The patient's score is based upon: CHF History: 0 HTN History: 0 Diabetes History: 0 Stroke History: 0 Vascular Disease History: 1 (elevated calcium score on CT) Age Score: 2 Gender Score: 0   {This patient has a significant risk of stroke if diagnosed with atrial fibrillation.  Please consider VKA or DOAC agent for anticoagulation if the bleeding risk is acceptable.   You can also use the SmartPhrase .[email or web address] for documentation.   :[id number]} No BP recorded.  {Refresh Note OR Click here to enter BP  :1}***        Physical Exam:   VS:  There were no vitals taken for this visit.   Wt Readings from Last 3 Encounters:  11/23/22 206 lb (93.4 kg)  10/26/22 190 lb (86.2 kg)  10/12/22 209 lb (94.8 kg)     GEN: Well nourished, well developed in no acute distress NECK: No JVD; No carotid bruits CARDIAC: {EPRHYTHM:28826}, no murmurs, rubs, gallops RESPIRATORY:  Clear to auscultation without  rales, wheezing or rhonchi  ABDOMEN: Soft, non-tender, non-distended EXTREMITIES:  No edema; No deformity   ASSESSMENT AND PLAN:    1.  Paroxysmal atrial fibrillation: 14% burden.  On flecainide and Toprol-XL.  Post ablation 10/26/2022.***  2.  Second hypercoagulable state: Currently on Xarelto for atrial fibrillation  3.  PVCs: Low burden on cardiac monitor.  Follow up with {YQMVH:84696} {EPFOLLOW EX:52841}  Signed, Venera Privott Jorja Loa, MD

## 2023-04-27 ENCOUNTER — Ambulatory Visit: Payer: Medicare Other | Admitting: Cardiology

## 2023-04-30 ENCOUNTER — Encounter: Payer: Self-pay | Admitting: Cardiology

## 2023-04-30 DIAGNOSIS — D649 Anemia, unspecified: Secondary | ICD-10-CM | POA: Diagnosis not present

## 2023-05-02 DIAGNOSIS — Z452 Encounter for adjustment and management of vascular access device: Secondary | ICD-10-CM | POA: Diagnosis not present

## 2023-05-02 DIAGNOSIS — B9689 Other specified bacterial agents as the cause of diseases classified elsewhere: Secondary | ICD-10-CM | POA: Diagnosis not present

## 2023-05-02 DIAGNOSIS — Z792 Long term (current) use of antibiotics: Secondary | ICD-10-CM | POA: Diagnosis not present

## 2023-05-02 DIAGNOSIS — M86171 Other acute osteomyelitis, right ankle and foot: Secondary | ICD-10-CM | POA: Diagnosis not present

## 2023-05-02 DIAGNOSIS — L02611 Cutaneous abscess of right foot: Secondary | ICD-10-CM | POA: Diagnosis not present

## 2023-05-02 DIAGNOSIS — I4821 Permanent atrial fibrillation: Secondary | ICD-10-CM | POA: Diagnosis not present

## 2023-05-02 DIAGNOSIS — G629 Polyneuropathy, unspecified: Secondary | ICD-10-CM | POA: Diagnosis not present

## 2023-05-02 DIAGNOSIS — L03031 Cellulitis of right toe: Secondary | ICD-10-CM | POA: Diagnosis not present

## 2023-05-02 DIAGNOSIS — D469 Myelodysplastic syndrome, unspecified: Secondary | ICD-10-CM | POA: Diagnosis not present

## 2023-05-02 DIAGNOSIS — K219 Gastro-esophageal reflux disease without esophagitis: Secondary | ICD-10-CM | POA: Diagnosis not present

## 2023-05-02 DIAGNOSIS — Z7901 Long term (current) use of anticoagulants: Secondary | ICD-10-CM | POA: Diagnosis not present

## 2023-05-02 DIAGNOSIS — G4733 Obstructive sleep apnea (adult) (pediatric): Secondary | ICD-10-CM | POA: Diagnosis not present

## 2023-05-02 DIAGNOSIS — I10 Essential (primary) hypertension: Secondary | ICD-10-CM | POA: Diagnosis not present

## 2023-05-03 DIAGNOSIS — M86171 Other acute osteomyelitis, right ankle and foot: Secondary | ICD-10-CM | POA: Diagnosis not present

## 2023-05-08 ENCOUNTER — Other Ambulatory Visit: Payer: Self-pay

## 2023-05-08 ENCOUNTER — Emergency Department (HOSPITAL_BASED_OUTPATIENT_CLINIC_OR_DEPARTMENT_OTHER): Payer: Medicare Other

## 2023-05-08 ENCOUNTER — Encounter (HOSPITAL_BASED_OUTPATIENT_CLINIC_OR_DEPARTMENT_OTHER): Payer: Self-pay | Admitting: Emergency Medicine

## 2023-05-08 ENCOUNTER — Emergency Department (HOSPITAL_BASED_OUTPATIENT_CLINIC_OR_DEPARTMENT_OTHER)
Admission: EM | Admit: 2023-05-08 | Discharge: 2023-05-08 | Discharge status: Against Medical Advice | Payer: Medicare Other | Attending: Emergency Medicine | Admitting: Emergency Medicine

## 2023-05-08 DIAGNOSIS — Z7901 Long term (current) use of anticoagulants: Secondary | ICD-10-CM | POA: Diagnosis not present

## 2023-05-08 DIAGNOSIS — I10 Essential (primary) hypertension: Secondary | ICD-10-CM | POA: Diagnosis not present

## 2023-05-08 DIAGNOSIS — Z4800 Encounter for change or removal of nonsurgical wound dressing: Secondary | ICD-10-CM | POA: Insufficient documentation

## 2023-05-08 DIAGNOSIS — I482 Chronic atrial fibrillation, unspecified: Secondary | ICD-10-CM | POA: Diagnosis not present

## 2023-05-08 DIAGNOSIS — L02611 Cutaneous abscess of right foot: Secondary | ICD-10-CM | POA: Insufficient documentation

## 2023-05-08 DIAGNOSIS — Z792 Long term (current) use of antibiotics: Secondary | ICD-10-CM | POA: Diagnosis not present

## 2023-05-08 DIAGNOSIS — L0291 Cutaneous abscess, unspecified: Secondary | ICD-10-CM | POA: Diagnosis not present

## 2023-05-08 DIAGNOSIS — Z79899 Other long term (current) drug therapy: Secondary | ICD-10-CM | POA: Insufficient documentation

## 2023-05-08 DIAGNOSIS — Z7982 Long term (current) use of aspirin: Secondary | ICD-10-CM | POA: Insufficient documentation

## 2023-05-08 DIAGNOSIS — G629 Polyneuropathy, unspecified: Secondary | ICD-10-CM | POA: Diagnosis not present

## 2023-05-08 DIAGNOSIS — L03031 Cellulitis of right toe: Secondary | ICD-10-CM | POA: Diagnosis not present

## 2023-05-08 DIAGNOSIS — Z452 Encounter for adjustment and management of vascular access device: Secondary | ICD-10-CM | POA: Diagnosis not present

## 2023-05-08 DIAGNOSIS — G4733 Obstructive sleep apnea (adult) (pediatric): Secondary | ICD-10-CM | POA: Diagnosis not present

## 2023-05-08 DIAGNOSIS — K219 Gastro-esophageal reflux disease without esophagitis: Secondary | ICD-10-CM | POA: Diagnosis not present

## 2023-05-08 DIAGNOSIS — I4821 Permanent atrial fibrillation: Secondary | ICD-10-CM | POA: Diagnosis not present

## 2023-05-08 DIAGNOSIS — M7989 Other specified soft tissue disorders: Secondary | ICD-10-CM | POA: Diagnosis not present

## 2023-05-08 DIAGNOSIS — B9689 Other specified bacterial agents as the cause of diseases classified elsewhere: Secondary | ICD-10-CM | POA: Diagnosis not present

## 2023-05-08 DIAGNOSIS — L089 Local infection of the skin and subcutaneous tissue, unspecified: Secondary | ICD-10-CM | POA: Diagnosis not present

## 2023-05-08 DIAGNOSIS — D469 Myelodysplastic syndrome, unspecified: Secondary | ICD-10-CM | POA: Diagnosis not present

## 2023-05-08 DIAGNOSIS — R600 Localized edema: Secondary | ICD-10-CM | POA: Diagnosis not present

## 2023-05-08 DIAGNOSIS — M86171 Other acute osteomyelitis, right ankle and foot: Secondary | ICD-10-CM | POA: Diagnosis not present

## 2023-05-08 DIAGNOSIS — R2241 Localized swelling, mass and lump, right lower limb: Secondary | ICD-10-CM | POA: Diagnosis present

## 2023-05-08 LAB — COMPREHENSIVE METABOLIC PANEL WITH GFR
ALT: 10 U/L (ref 0–44)
AST: 19 U/L (ref 15–41)
Albumin: 3.7 g/dL (ref 3.5–5.0)
Alkaline Phosphatase: 42 U/L (ref 38–126)
Anion gap: 8 (ref 5–15)
BUN: 14 mg/dL (ref 8–23)
CO2: 25 mmol/L (ref 22–32)
Calcium: 9.4 mg/dL (ref 8.9–10.3)
Chloride: 99 mmol/L (ref 98–111)
Creatinine, Ser: 1.03 mg/dL (ref 0.61–1.24)
GFR, Estimated: >60 mL/min
Glucose, Bld: 129 mg/dL — ABNORMAL HIGH (ref 70–99)
Potassium: 4.1 mmol/L (ref 3.5–5.1)
Sodium: 132 mmol/L — ABNORMAL LOW (ref 135–145)
Total Bilirubin: 0.5 mg/dL (ref 0.3–1.2)
Total Protein: 6.7 g/dL (ref 6.5–8.1)

## 2023-05-08 LAB — CBC WITH DIFFERENTIAL/PLATELET
Abs Immature Granulocytes: 1.4 K/uL — ABNORMAL HIGH (ref 0.00–0.07)
Basophils Absolute: 0.1 K/uL (ref 0.0–0.1)
Basophils Relative: 1 %
Eosinophils Absolute: 0.1 K/uL (ref 0.0–0.5)
Eosinophils Relative: 1 %
HCT: 30.6 % — ABNORMAL LOW (ref 39.0–52.0)
Hemoglobin: 10.1 g/dL — ABNORMAL LOW (ref 13.0–17.0)
Immature Granulocytes: 11 %
Lymphocytes Relative: 8 %
Lymphs Abs: 1.1 K/uL (ref 0.7–4.0)
MCH: 34.4 pg — ABNORMAL HIGH (ref 26.0–34.0)
MCHC: 33 g/dL (ref 30.0–36.0)
MCV: 104.1 fL — ABNORMAL HIGH (ref 80.0–100.0)
Monocytes Absolute: 1.5 K/uL — ABNORMAL HIGH (ref 0.1–1.0)
Monocytes Relative: 12 %
Neutro Abs: 8.8 K/uL — ABNORMAL HIGH (ref 1.7–7.7)
Neutrophils Relative %: 67 %
Platelets: 405 K/uL — ABNORMAL HIGH (ref 150–400)
RBC: 2.94 MIL/uL — ABNORMAL LOW (ref 4.22–5.81)
RDW: 15.4 % (ref 11.5–15.5)
WBC: 13 K/uL — ABNORMAL HIGH (ref 4.0–10.5)
nRBC: 0.3 % — ABNORMAL HIGH (ref 0.0–0.2)

## 2023-05-08 LAB — LACTIC ACID, PLASMA: Lactic Acid, Venous: 1.5 mmol/L (ref 0.5–1.9)

## 2023-05-08 MED ORDER — IOHEXOL 300 MG/ML  SOLN
100.0000 mL | Freq: Once | INTRAMUSCULAR | Status: AC | PRN
  Administered 2023-05-08: 100 mL via INTRAVENOUS

## 2023-05-08 NOTE — Discharge Instructions (Signed)
Your CT showed gas in your foot, this may represent necrotizing fasciitis, or flesh eating bacteria, versus postop changes.  We recommended that you stay, however you declined, you understand the risk associated with this, such as worsening of your condition, loss of your leg.  We did do a CT, I recommend that you talk to your podiatrist/surgeon tomorrow, and let them know that we did a CT here, so they can evaluate it.  Return to ER if you feel like your symptoms are worsening

## 2023-05-08 NOTE — ED Provider Notes (Signed)
[place] EMERGENCY DEPARTMENT AT [facility] Provider Note   CSN: [id number] Arrival date & time: 05/08/23  1507     History  Chief Complaint  Patient presents with   Wound Check    Alexander Maynard is a 77 y.o. male, history of peripheral neuropathy, chronic A-fib, status post ablation, on Eliquis, who presents to the ED secondary to concern for worsening infection, the right foot.  Has known osteomyelitis, and has been on daptomycin for the past month.  Notes that it improves and gets worse.  Has been seeing podiatry, general surgery, but states that today the home nurse looked at it, and it appears to have become more red swollen and had purulent discharge.  He denies any fevers, chills.  But does state that his foot has become more red it is not nearly as red or swollen as it was about a month ago.  States he was told by the home health nurse, not to go to [facility], but to a different hospital.     Home Medications Prior to Admission medications   Medication Sig Start Date End Date Taking? Authorizing Provider  DAPTOmycin (CUBICIN) 500 MG injection Inject into the vein. 05/07/23  Yes [provider]  metroNIDAZOLE (FLAGYL) 500 MG tablet Take 500 mg by mouth 3 (three) times daily. 04/23/23  Yes [provider]  aspirin 81 MG EC tablet Take 81 mg by mouth daily. 01/10/16   [provider]  flecainide (TAMBOCOR) 50 MG tablet Take 1 tablet by mouth twice daily 09/07/22   Camnitz, Andree Coss, MD  gabapentin (NEURONTIN) 300 MG capsule Take 300 mg by mouth 2 (two) times daily.    [provider]  hydroxyurea (HYDREA) 500 MG capsule Take 1,000 mg by mouth daily. May take with food to minimize GI side effects.    [provider]  levothyroxine (SYNTHROID) 88 MCG tablet Take 88 mcg by mouth daily before breakfast.    [provider]  metoprolol succinate (TOPROL-XL) 25 MG 24 hr tablet TAKE 1 TABLET BY MOUTH ONCE DAILY AT BEDTIME .   TAKE  WITH  OR  IMMEDIATELY  FOLLOWING  A  MEAL Patient taking differently: Take 25 mg by mouth at bedtime. 09/07/22   Camnitz, Andree Coss, MD  Multiple Vitamin (MULTIVITAMIN WITH MINERALS) TABS tablet Take 1 tablet by mouth daily.    [provider]  omeprazole (PRILOSEC) 20 MG capsule Take 20 mg by mouth daily.    [provider]  rivaroxaban (XARELTO) 20 MG TABS tablet Take 20 mg by mouth every morning.    [provider]  tamsulosin (FLOMAX) 0.4 MG CAPS capsule Take 0.4 mg by mouth in the morning and at bedtime.    [provider]      Allergies    Patient has no known allergies.    Review of Systems   Review of Systems  Constitutional:  Negative for fever.  Skin:  Positive for wound.    Physical Exam Updated Vital Signs BP 138/77 (BP Location: Right Arm)   Pulse 86   Temp 97.9 F (36.6 C) (Oral)   Resp 16   SpO2 93%  Physical Exam Vitals and nursing note reviewed.  Constitutional:      General: He is not in acute distress.    Appearance: He is well-developed.  HENT:     Head: Normocephalic and atraumatic.  Eyes:     Conjunctiva/sclera: Conjunctivae normal.  Cardiovascular:     Rate and  Rhythm: Normal rate and regular rhythm.     Pulses:          Dorsalis pedis pulses are 2+ on the right side and 2+ on the left side.     Heart sounds: No murmur heard. Pulmonary:     Effort: Pulmonary effort is normal. No respiratory distress.     Breath sounds: Normal breath sounds.  Abdominal:     Palpations: Abdomen is soft.     Tenderness: There is no abdominal tenderness.  Musculoskeletal:        General: No swelling.     Cervical back: Neck supple.  Skin:    General: Skin is warm and dry.     Capillary Refill: Capillary refill takes less than 2 seconds.     Comments: Mild erythema of dorsal aspect of R foot. No fluctuance around wound site.   Neurological:     Mental Status: He is alert.  Psychiatric:        Mood and Affect: Mood  normal.        ED Results / Procedures / Treatments   Labs (all labs ordered are listed, but only abnormal results are displayed) Labs Reviewed  CBC WITH DIFFERENTIAL/PLATELET - Abnormal; Notable for the following components:      Result Value   WBC 13.0 (*)    RBC 2.94 (*)    Hemoglobin 10.1 (*)    HCT 30.6 (*)    MCV 104.1 (*)    MCH 34.4 (*)    Platelets 405 (*)    nRBC 0.3 (*)    Neutro Abs 8.8 (*)    Monocytes Absolute 1.5 (*)    Abs Immature Granulocytes 1.40 (*)    All other components within normal limits  COMPREHENSIVE METABOLIC PANEL - Abnormal; Notable for the following components:   Sodium 132 (*)    Glucose, Bld 129 (*)    All other components within normal limits  LACTIC ACID, PLASMA    EKG None  Radiology CT FOOT RIGHT W CONTRAST  Result Date: 05/08/2023 CLINICAL DATA:  Soft tissue infection suspected. Evaluate for abscess. History of osteomyelitis. Purulent drainage. EXAM: CT OF THE LOWER RIGHT EXTREMITY WITH CONTRAST TECHNIQUE: Multidetector CT imaging of the lower right extremity was performed according to the standard protocol following intravenous contrast administration. RADIATION DOSE REDUCTION: This exam was performed according to the departmental dose-optimization program which includes automated exposure control, adjustment of the mA and/or kV according to patient size and/or use of iterative reconstruction technique. CONTRAST:  OMNIPAQUE IOHEXOL 300 MG/ML  SOLN COMPARISON:  None Available. FINDINGS: Bones/Joint/Cartilage Amputation of the phalanges of the second toe. There is no acute fracture or dislocation. The bones are mildly osteopenic. No significant arthritic changes. Ligaments Suboptimally assessed by CT. Muscles and Tendons No intramuscular fluid collection or hematoma. Soft tissues There is diffuse skin thickening and subcutaneous soft tissue edema of the dorsum of the foot. There is heterogeneity and edema of the soft tissues distal to  the second metatarsal with Jobani Sabado pockets of air. Areas of low attenuation may represent necrotic tissue. High attenuating content in the superficial soft tissues extending to the skin likely represent packing material. There is ulceration and skin defect of the plantar forefoot at the level of the second metatarsal head with high attenuating content extending to the skin. No drainable fluid collection at this time. IMPRESSION: 1. Amputation of the phalanges of the second toe. 2. Alteration of the skin and edema in the soft tissues adjacent  to the second metatarsal head. Soft tissue air in this region may be postoperative or represent necrotizing infection. There are areas of low attenuation, possibly phlegmon. No drainable fluid collection at this time. Electronically Signed   By: Elgie Collard M.D.   On: 05/08/2023 19:49    Procedures Procedures    Medications Ordered in ED Medications  iohexol (OMNIPAQUE) 300 MG/ML solution 100 mL (100 mLs Intravenous Contrast Given 05/08/23 1703)    ED Course/ Medical Decision Making/ A&P                                 Medical Decision Making Patient is a 77 year old male, history of osteomyelitis, seen at [facility], [place], for a wound infection, started on daptomycin a month ago.  Here because there was increased drainage, purulent discharge, today seen by wound nurse.  States he would like to be evaluated, because he is not sure about [facility]'s care.  On my exam there is no fluctuance, of the foot, or drainage, from the wound.  However he does have erythema of the right foot, and thus we will obtain a CT foot, to evaluate for possible abscess given that he had purulent drainage, from the wound today.  The family member showed me a picture, which did indeed look like purulent drainage from the wound.  Will obtain a lactic acid, as well as basic blood work for further evaluation.  He has been compliant with his daptomycin  Amount and/or Complexity of Data  Reviewed Labs: ordered.    Details: Leukocytosis of 13K, normal lactic acid Radiology: ordered.    Details: CT foot, shows gas in the area, which may represent necrotizing infection, versus postop change.  Additionally areas of low-attenuation possible phlegmon Discussion of management or test interpretation with external provider(s): Discussed with patient, CT shows gas in the area, concern for possible necrotizing infection versus other worsening infection.  I recommended admission, with IV antibiotics, patient declined, voicing understanding, if necrotizing, that he may lose his limb, and it may spread further.  He voiced understanding in regards to this, and would like to leave AGAINST MEDICAL ADVICE.  Return precautions were emphasized.  Risk Prescription drug management.   Final Clinical Impression(s) / ED Diagnoses Final diagnoses:  Right foot infection    Rx / DC Orders ED Discharge Orders     None         Jawanda Passey, Harley [place], PA 05/08/23 2028    Benjiman Core, MD 05/08/23 2236

## 2023-05-08 NOTE — ED Notes (Addendum)
Pt left AMA... Pt was aware of the risk of leaving... Pt understood... Pt was offered to have his wound rewrapped and declined... Pt stated that his wife would rewrap it at the house.Marland KitchenMarland Kitchen

## 2023-05-08 NOTE — ED Triage Notes (Signed)
Patient currently on home abt through PICC line for right foot wound. Pus from wound, visiting nurse advised be seen.  Hx osteo. Picc line right upper arm

## 2023-05-09 DIAGNOSIS — I10 Essential (primary) hypertension: Secondary | ICD-10-CM | POA: Diagnosis not present

## 2023-05-09 DIAGNOSIS — M86171 Other acute osteomyelitis, right ankle and foot: Secondary | ICD-10-CM | POA: Diagnosis not present

## 2023-05-09 DIAGNOSIS — L089 Local infection of the skin and subcutaneous tissue, unspecified: Secondary | ICD-10-CM | POA: Diagnosis not present

## 2023-05-09 DIAGNOSIS — L02611 Cutaneous abscess of right foot: Secondary | ICD-10-CM | POA: Diagnosis not present

## 2023-05-09 DIAGNOSIS — K219 Gastro-esophageal reflux disease without esophagitis: Secondary | ICD-10-CM | POA: Diagnosis not present

## 2023-05-09 DIAGNOSIS — L03031 Cellulitis of right toe: Secondary | ICD-10-CM | POA: Diagnosis not present

## 2023-05-09 DIAGNOSIS — I4891 Unspecified atrial fibrillation: Secondary | ICD-10-CM | POA: Diagnosis not present

## 2023-05-09 DIAGNOSIS — Z87891 Personal history of nicotine dependence: Secondary | ICD-10-CM | POA: Diagnosis not present

## 2023-05-09 DIAGNOSIS — M869 Osteomyelitis, unspecified: Secondary | ICD-10-CM | POA: Diagnosis not present

## 2023-05-09 DIAGNOSIS — D649 Anemia, unspecified: Secondary | ICD-10-CM | POA: Diagnosis not present

## 2023-05-10 DIAGNOSIS — I10 Essential (primary) hypertension: Secondary | ICD-10-CM | POA: Diagnosis not present

## 2023-05-10 DIAGNOSIS — M86171 Other acute osteomyelitis, right ankle and foot: Secondary | ICD-10-CM | POA: Diagnosis not present

## 2023-05-10 DIAGNOSIS — Z7901 Long term (current) use of anticoagulants: Secondary | ICD-10-CM | POA: Diagnosis not present

## 2023-05-10 DIAGNOSIS — Z87891 Personal history of nicotine dependence: Secondary | ICD-10-CM | POA: Diagnosis not present

## 2023-05-10 DIAGNOSIS — L03031 Cellulitis of right toe: Secondary | ICD-10-CM | POA: Diagnosis not present

## 2023-05-10 DIAGNOSIS — I4821 Permanent atrial fibrillation: Secondary | ICD-10-CM | POA: Diagnosis not present

## 2023-05-10 DIAGNOSIS — D469 Myelodysplastic syndrome, unspecified: Secondary | ICD-10-CM | POA: Diagnosis not present

## 2023-05-10 DIAGNOSIS — L02611 Cutaneous abscess of right foot: Secondary | ICD-10-CM | POA: Diagnosis not present

## 2023-05-10 DIAGNOSIS — Z452 Encounter for adjustment and management of vascular access device: Secondary | ICD-10-CM | POA: Diagnosis not present

## 2023-05-10 DIAGNOSIS — G4733 Obstructive sleep apnea (adult) (pediatric): Secondary | ICD-10-CM | POA: Diagnosis not present

## 2023-05-10 DIAGNOSIS — B9689 Other specified bacterial agents as the cause of diseases classified elsewhere: Secondary | ICD-10-CM | POA: Diagnosis not present

## 2023-05-10 DIAGNOSIS — K219 Gastro-esophageal reflux disease without esophagitis: Secondary | ICD-10-CM | POA: Diagnosis not present

## 2023-05-10 DIAGNOSIS — Z792 Long term (current) use of antibiotics: Secondary | ICD-10-CM | POA: Diagnosis not present

## 2023-05-10 DIAGNOSIS — G629 Polyneuropathy, unspecified: Secondary | ICD-10-CM | POA: Diagnosis not present

## 2023-05-10 DIAGNOSIS — I4891 Unspecified atrial fibrillation: Secondary | ICD-10-CM | POA: Diagnosis not present

## 2023-05-11 DIAGNOSIS — K219 Gastro-esophageal reflux disease without esophagitis: Secondary | ICD-10-CM | POA: Diagnosis not present

## 2023-05-11 DIAGNOSIS — L02611 Cutaneous abscess of right foot: Secondary | ICD-10-CM | POA: Diagnosis not present

## 2023-05-11 DIAGNOSIS — L03031 Cellulitis of right toe: Secondary | ICD-10-CM | POA: Diagnosis not present

## 2023-05-11 DIAGNOSIS — Z87891 Personal history of nicotine dependence: Secondary | ICD-10-CM | POA: Diagnosis not present

## 2023-05-11 DIAGNOSIS — M86171 Other acute osteomyelitis, right ankle and foot: Secondary | ICD-10-CM | POA: Diagnosis not present

## 2023-05-11 DIAGNOSIS — I4891 Unspecified atrial fibrillation: Secondary | ICD-10-CM | POA: Diagnosis not present

## 2023-05-11 DIAGNOSIS — I10 Essential (primary) hypertension: Secondary | ICD-10-CM | POA: Diagnosis not present

## 2023-05-16 DIAGNOSIS — M86171 Other acute osteomyelitis, right ankle and foot: Secondary | ICD-10-CM | POA: Diagnosis not present

## 2023-05-16 DIAGNOSIS — I4891 Unspecified atrial fibrillation: Secondary | ICD-10-CM | POA: Diagnosis not present

## 2023-05-16 DIAGNOSIS — L03031 Cellulitis of right toe: Secondary | ICD-10-CM | POA: Diagnosis not present

## 2023-05-16 DIAGNOSIS — L02611 Cutaneous abscess of right foot: Secondary | ICD-10-CM | POA: Diagnosis not present

## 2023-05-16 DIAGNOSIS — Z87891 Personal history of nicotine dependence: Secondary | ICD-10-CM | POA: Diagnosis not present

## 2023-05-16 DIAGNOSIS — D751 Secondary polycythemia: Secondary | ICD-10-CM | POA: Diagnosis not present

## 2023-05-16 DIAGNOSIS — K219 Gastro-esophageal reflux disease without esophagitis: Secondary | ICD-10-CM | POA: Diagnosis not present

## 2023-05-16 DIAGNOSIS — I10 Essential (primary) hypertension: Secondary | ICD-10-CM | POA: Diagnosis not present

## 2023-05-17 DIAGNOSIS — Z792 Long term (current) use of antibiotics: Secondary | ICD-10-CM | POA: Diagnosis not present

## 2023-05-17 DIAGNOSIS — K219 Gastro-esophageal reflux disease without esophagitis: Secondary | ICD-10-CM | POA: Diagnosis not present

## 2023-05-17 DIAGNOSIS — M86171 Other acute osteomyelitis, right ankle and foot: Secondary | ICD-10-CM | POA: Diagnosis not present

## 2023-05-17 DIAGNOSIS — D469 Myelodysplastic syndrome, unspecified: Secondary | ICD-10-CM | POA: Diagnosis not present

## 2023-05-17 DIAGNOSIS — I10 Essential (primary) hypertension: Secondary | ICD-10-CM | POA: Diagnosis not present

## 2023-05-17 DIAGNOSIS — Z7901 Long term (current) use of anticoagulants: Secondary | ICD-10-CM | POA: Diagnosis not present

## 2023-05-17 DIAGNOSIS — G629 Polyneuropathy, unspecified: Secondary | ICD-10-CM | POA: Diagnosis not present

## 2023-05-17 DIAGNOSIS — L02611 Cutaneous abscess of right foot: Secondary | ICD-10-CM | POA: Diagnosis not present

## 2023-05-17 DIAGNOSIS — G4733 Obstructive sleep apnea (adult) (pediatric): Secondary | ICD-10-CM | POA: Diagnosis not present

## 2023-05-17 DIAGNOSIS — Z452 Encounter for adjustment and management of vascular access device: Secondary | ICD-10-CM | POA: Diagnosis not present

## 2023-05-17 DIAGNOSIS — L03031 Cellulitis of right toe: Secondary | ICD-10-CM | POA: Diagnosis not present

## 2023-05-17 DIAGNOSIS — B9689 Other specified bacterial agents as the cause of diseases classified elsewhere: Secondary | ICD-10-CM | POA: Diagnosis not present

## 2023-05-17 DIAGNOSIS — I4821 Permanent atrial fibrillation: Secondary | ICD-10-CM | POA: Diagnosis not present

## 2023-05-22 DIAGNOSIS — I4821 Permanent atrial fibrillation: Secondary | ICD-10-CM | POA: Diagnosis not present

## 2023-05-22 DIAGNOSIS — L03031 Cellulitis of right toe: Secondary | ICD-10-CM | POA: Diagnosis not present

## 2023-05-22 DIAGNOSIS — I10 Essential (primary) hypertension: Secondary | ICD-10-CM | POA: Diagnosis not present

## 2023-05-22 DIAGNOSIS — Z7901 Long term (current) use of anticoagulants: Secondary | ICD-10-CM | POA: Diagnosis not present

## 2023-05-22 DIAGNOSIS — K219 Gastro-esophageal reflux disease without esophagitis: Secondary | ICD-10-CM | POA: Diagnosis not present

## 2023-05-22 DIAGNOSIS — G629 Polyneuropathy, unspecified: Secondary | ICD-10-CM | POA: Diagnosis not present

## 2023-05-22 DIAGNOSIS — L02611 Cutaneous abscess of right foot: Secondary | ICD-10-CM | POA: Diagnosis not present

## 2023-05-22 DIAGNOSIS — G4733 Obstructive sleep apnea (adult) (pediatric): Secondary | ICD-10-CM | POA: Diagnosis not present

## 2023-05-22 DIAGNOSIS — D469 Myelodysplastic syndrome, unspecified: Secondary | ICD-10-CM | POA: Diagnosis not present

## 2023-05-22 DIAGNOSIS — Z792 Long term (current) use of antibiotics: Secondary | ICD-10-CM | POA: Diagnosis not present

## 2023-05-22 DIAGNOSIS — M86171 Other acute osteomyelitis, right ankle and foot: Secondary | ICD-10-CM | POA: Diagnosis not present

## 2023-05-22 DIAGNOSIS — Z452 Encounter for adjustment and management of vascular access device: Secondary | ICD-10-CM | POA: Diagnosis not present

## 2023-05-22 DIAGNOSIS — B9689 Other specified bacterial agents as the cause of diseases classified elsewhere: Secondary | ICD-10-CM | POA: Diagnosis not present

## 2023-05-23 DIAGNOSIS — K219 Gastro-esophageal reflux disease without esophagitis: Secondary | ICD-10-CM | POA: Diagnosis not present

## 2023-05-23 DIAGNOSIS — I4891 Unspecified atrial fibrillation: Secondary | ICD-10-CM | POA: Diagnosis not present

## 2023-05-23 DIAGNOSIS — Z87891 Personal history of nicotine dependence: Secondary | ICD-10-CM | POA: Diagnosis not present

## 2023-05-23 DIAGNOSIS — L02611 Cutaneous abscess of right foot: Secondary | ICD-10-CM | POA: Diagnosis not present

## 2023-05-23 DIAGNOSIS — L03031 Cellulitis of right toe: Secondary | ICD-10-CM | POA: Diagnosis not present

## 2023-05-23 DIAGNOSIS — M86171 Other acute osteomyelitis, right ankle and foot: Secondary | ICD-10-CM | POA: Diagnosis not present

## 2023-05-23 DIAGNOSIS — I10 Essential (primary) hypertension: Secondary | ICD-10-CM | POA: Diagnosis not present

## 2023-05-24 DIAGNOSIS — E875 Hyperkalemia: Secondary | ICD-10-CM | POA: Diagnosis not present

## 2023-05-24 DIAGNOSIS — R5382 Chronic fatigue, unspecified: Secondary | ICD-10-CM | POA: Diagnosis not present

## 2023-05-24 DIAGNOSIS — K219 Gastro-esophageal reflux disease without esophagitis: Secondary | ICD-10-CM | POA: Diagnosis not present

## 2023-05-24 DIAGNOSIS — D649 Anemia, unspecified: Secondary | ICD-10-CM | POA: Diagnosis not present

## 2023-05-30 DIAGNOSIS — G629 Polyneuropathy, unspecified: Secondary | ICD-10-CM | POA: Diagnosis not present

## 2023-05-30 DIAGNOSIS — B9689 Other specified bacterial agents as the cause of diseases classified elsewhere: Secondary | ICD-10-CM | POA: Diagnosis not present

## 2023-05-30 DIAGNOSIS — Z452 Encounter for adjustment and management of vascular access device: Secondary | ICD-10-CM | POA: Diagnosis not present

## 2023-05-30 DIAGNOSIS — M86171 Other acute osteomyelitis, right ankle and foot: Secondary | ICD-10-CM | POA: Diagnosis not present

## 2023-05-30 DIAGNOSIS — I10 Essential (primary) hypertension: Secondary | ICD-10-CM | POA: Diagnosis not present

## 2023-05-30 DIAGNOSIS — L02611 Cutaneous abscess of right foot: Secondary | ICD-10-CM | POA: Diagnosis not present

## 2023-05-30 DIAGNOSIS — G4733 Obstructive sleep apnea (adult) (pediatric): Secondary | ICD-10-CM | POA: Diagnosis not present

## 2023-05-30 DIAGNOSIS — D469 Myelodysplastic syndrome, unspecified: Secondary | ICD-10-CM | POA: Diagnosis not present

## 2023-05-30 DIAGNOSIS — Z792 Long term (current) use of antibiotics: Secondary | ICD-10-CM | POA: Diagnosis not present

## 2023-05-30 DIAGNOSIS — Z7901 Long term (current) use of anticoagulants: Secondary | ICD-10-CM | POA: Diagnosis not present

## 2023-05-30 DIAGNOSIS — K219 Gastro-esophageal reflux disease without esophagitis: Secondary | ICD-10-CM | POA: Diagnosis not present

## 2023-05-30 DIAGNOSIS — L03031 Cellulitis of right toe: Secondary | ICD-10-CM | POA: Diagnosis not present

## 2023-05-30 DIAGNOSIS — I4821 Permanent atrial fibrillation: Secondary | ICD-10-CM | POA: Diagnosis not present

## 2023-06-01 DIAGNOSIS — E039 Hypothyroidism, unspecified: Secondary | ICD-10-CM | POA: Diagnosis not present

## 2023-06-01 DIAGNOSIS — D751 Secondary polycythemia: Secondary | ICD-10-CM | POA: Diagnosis not present

## 2023-06-01 DIAGNOSIS — G6289 Other specified polyneuropathies: Secondary | ICD-10-CM | POA: Diagnosis not present

## 2023-06-01 DIAGNOSIS — M869 Osteomyelitis, unspecified: Secondary | ICD-10-CM | POA: Diagnosis not present

## 2023-06-01 DIAGNOSIS — D473 Essential (hemorrhagic) thrombocythemia: Secondary | ICD-10-CM | POA: Diagnosis not present

## 2023-06-01 DIAGNOSIS — G2581 Restless legs syndrome: Secondary | ICD-10-CM | POA: Diagnosis not present

## 2023-06-01 DIAGNOSIS — I4891 Unspecified atrial fibrillation: Secondary | ICD-10-CM | POA: Diagnosis not present

## 2023-06-01 DIAGNOSIS — R030 Elevated blood-pressure reading, without diagnosis of hypertension: Secondary | ICD-10-CM | POA: Diagnosis not present

## 2023-06-01 DIAGNOSIS — E7849 Other hyperlipidemia: Secondary | ICD-10-CM | POA: Diagnosis not present

## 2023-06-01 DIAGNOSIS — L03115 Cellulitis of right lower limb: Secondary | ICD-10-CM | POA: Diagnosis not present

## 2023-06-05 DIAGNOSIS — L03031 Cellulitis of right toe: Secondary | ICD-10-CM | POA: Diagnosis not present

## 2023-06-05 DIAGNOSIS — Z792 Long term (current) use of antibiotics: Secondary | ICD-10-CM | POA: Diagnosis not present

## 2023-06-05 DIAGNOSIS — K219 Gastro-esophageal reflux disease without esophagitis: Secondary | ICD-10-CM | POA: Diagnosis not present

## 2023-06-05 DIAGNOSIS — Z452 Encounter for adjustment and management of vascular access device: Secondary | ICD-10-CM | POA: Diagnosis not present

## 2023-06-05 DIAGNOSIS — I4821 Permanent atrial fibrillation: Secondary | ICD-10-CM | POA: Diagnosis not present

## 2023-06-05 DIAGNOSIS — B9689 Other specified bacterial agents as the cause of diseases classified elsewhere: Secondary | ICD-10-CM | POA: Diagnosis not present

## 2023-06-05 DIAGNOSIS — M86171 Other acute osteomyelitis, right ankle and foot: Secondary | ICD-10-CM | POA: Diagnosis not present

## 2023-06-05 DIAGNOSIS — I10 Essential (primary) hypertension: Secondary | ICD-10-CM | POA: Diagnosis not present

## 2023-06-05 DIAGNOSIS — L02611 Cutaneous abscess of right foot: Secondary | ICD-10-CM | POA: Diagnosis not present

## 2023-06-05 DIAGNOSIS — G4733 Obstructive sleep apnea (adult) (pediatric): Secondary | ICD-10-CM | POA: Diagnosis not present

## 2023-06-05 DIAGNOSIS — D469 Myelodysplastic syndrome, unspecified: Secondary | ICD-10-CM | POA: Diagnosis not present

## 2023-06-05 DIAGNOSIS — Z7901 Long term (current) use of anticoagulants: Secondary | ICD-10-CM | POA: Diagnosis not present

## 2023-06-05 DIAGNOSIS — G629 Polyneuropathy, unspecified: Secondary | ICD-10-CM | POA: Diagnosis not present

## 2023-06-06 DIAGNOSIS — K219 Gastro-esophageal reflux disease without esophagitis: Secondary | ICD-10-CM | POA: Diagnosis not present

## 2023-06-06 DIAGNOSIS — E079 Disorder of thyroid, unspecified: Secondary | ICD-10-CM | POA: Diagnosis not present

## 2023-06-06 DIAGNOSIS — Z7901 Long term (current) use of anticoagulants: Secondary | ICD-10-CM | POA: Diagnosis not present

## 2023-06-06 DIAGNOSIS — L089 Local infection of the skin and subcutaneous tissue, unspecified: Secondary | ICD-10-CM | POA: Diagnosis not present

## 2023-06-06 DIAGNOSIS — Z87891 Personal history of nicotine dependence: Secondary | ICD-10-CM | POA: Diagnosis not present

## 2023-06-06 DIAGNOSIS — Z89421 Acquired absence of other right toe(s): Secondary | ICD-10-CM | POA: Diagnosis not present

## 2023-06-06 DIAGNOSIS — M86171 Other acute osteomyelitis, right ankle and foot: Secondary | ICD-10-CM | POA: Diagnosis not present

## 2023-06-06 DIAGNOSIS — I10 Essential (primary) hypertension: Secondary | ICD-10-CM | POA: Diagnosis not present

## 2023-06-08 DIAGNOSIS — K219 Gastro-esophageal reflux disease without esophagitis: Secondary | ICD-10-CM | POA: Diagnosis not present

## 2023-06-08 DIAGNOSIS — I10 Essential (primary) hypertension: Secondary | ICD-10-CM | POA: Diagnosis not present

## 2023-06-08 DIAGNOSIS — Z792 Long term (current) use of antibiotics: Secondary | ICD-10-CM | POA: Diagnosis not present

## 2023-06-08 DIAGNOSIS — G4733 Obstructive sleep apnea (adult) (pediatric): Secondary | ICD-10-CM | POA: Diagnosis not present

## 2023-06-08 DIAGNOSIS — L02611 Cutaneous abscess of right foot: Secondary | ICD-10-CM | POA: Diagnosis not present

## 2023-06-08 DIAGNOSIS — M86171 Other acute osteomyelitis, right ankle and foot: Secondary | ICD-10-CM | POA: Diagnosis not present

## 2023-06-08 DIAGNOSIS — Z7901 Long term (current) use of anticoagulants: Secondary | ICD-10-CM | POA: Diagnosis not present

## 2023-06-08 DIAGNOSIS — D469 Myelodysplastic syndrome, unspecified: Secondary | ICD-10-CM | POA: Diagnosis not present

## 2023-06-08 DIAGNOSIS — G629 Polyneuropathy, unspecified: Secondary | ICD-10-CM | POA: Diagnosis not present

## 2023-06-08 DIAGNOSIS — I4821 Permanent atrial fibrillation: Secondary | ICD-10-CM | POA: Diagnosis not present

## 2023-06-08 DIAGNOSIS — L03031 Cellulitis of right toe: Secondary | ICD-10-CM | POA: Diagnosis not present

## 2023-06-08 DIAGNOSIS — B9689 Other specified bacterial agents as the cause of diseases classified elsewhere: Secondary | ICD-10-CM | POA: Diagnosis not present

## 2023-06-08 DIAGNOSIS — Z452 Encounter for adjustment and management of vascular access device: Secondary | ICD-10-CM | POA: Diagnosis not present

## 2023-06-11 DIAGNOSIS — M869 Osteomyelitis, unspecified: Secondary | ICD-10-CM | POA: Diagnosis not present

## 2023-06-12 DIAGNOSIS — B9689 Other specified bacterial agents as the cause of diseases classified elsewhere: Secondary | ICD-10-CM | POA: Diagnosis not present

## 2023-06-12 DIAGNOSIS — Z4800 Encounter for change or removal of nonsurgical wound dressing: Secondary | ICD-10-CM | POA: Diagnosis not present

## 2023-06-12 DIAGNOSIS — G629 Polyneuropathy, unspecified: Secondary | ICD-10-CM | POA: Diagnosis not present

## 2023-06-12 DIAGNOSIS — L02611 Cutaneous abscess of right foot: Secondary | ICD-10-CM | POA: Diagnosis not present

## 2023-06-12 DIAGNOSIS — K219 Gastro-esophageal reflux disease without esophagitis: Secondary | ICD-10-CM | POA: Diagnosis not present

## 2023-06-12 DIAGNOSIS — M86171 Other acute osteomyelitis, right ankle and foot: Secondary | ICD-10-CM | POA: Diagnosis not present

## 2023-06-12 DIAGNOSIS — L03031 Cellulitis of right toe: Secondary | ICD-10-CM | POA: Diagnosis not present

## 2023-06-12 DIAGNOSIS — I4821 Permanent atrial fibrillation: Secondary | ICD-10-CM | POA: Diagnosis not present

## 2023-06-12 DIAGNOSIS — G4733 Obstructive sleep apnea (adult) (pediatric): Secondary | ICD-10-CM | POA: Diagnosis not present

## 2023-06-12 DIAGNOSIS — D469 Myelodysplastic syndrome, unspecified: Secondary | ICD-10-CM | POA: Diagnosis not present

## 2023-06-12 DIAGNOSIS — Z7901 Long term (current) use of anticoagulants: Secondary | ICD-10-CM | POA: Diagnosis not present

## 2023-06-12 DIAGNOSIS — I10 Essential (primary) hypertension: Secondary | ICD-10-CM | POA: Diagnosis not present

## 2023-06-13 ENCOUNTER — Ambulatory Visit: Payer: Medicare Other | Admitting: Podiatry

## 2023-06-13 DIAGNOSIS — L089 Local infection of the skin and subcutaneous tissue, unspecified: Secondary | ICD-10-CM | POA: Diagnosis not present

## 2023-06-13 DIAGNOSIS — L03031 Cellulitis of right toe: Secondary | ICD-10-CM | POA: Diagnosis not present

## 2023-06-13 DIAGNOSIS — Z89421 Acquired absence of other right toe(s): Secondary | ICD-10-CM | POA: Diagnosis not present

## 2023-06-13 DIAGNOSIS — Z7901 Long term (current) use of anticoagulants: Secondary | ICD-10-CM | POA: Diagnosis not present

## 2023-06-13 DIAGNOSIS — K219 Gastro-esophageal reflux disease without esophagitis: Secondary | ICD-10-CM | POA: Diagnosis not present

## 2023-06-13 DIAGNOSIS — M86171 Other acute osteomyelitis, right ankle and foot: Secondary | ICD-10-CM | POA: Diagnosis not present

## 2023-06-13 DIAGNOSIS — L02611 Cutaneous abscess of right foot: Secondary | ICD-10-CM | POA: Diagnosis not present

## 2023-06-13 DIAGNOSIS — E079 Disorder of thyroid, unspecified: Secondary | ICD-10-CM | POA: Diagnosis not present

## 2023-06-13 DIAGNOSIS — I10 Essential (primary) hypertension: Secondary | ICD-10-CM | POA: Diagnosis not present

## 2023-06-13 DIAGNOSIS — Z87891 Personal history of nicotine dependence: Secondary | ICD-10-CM | POA: Diagnosis not present

## 2023-06-15 DIAGNOSIS — G4733 Obstructive sleep apnea (adult) (pediatric): Secondary | ICD-10-CM | POA: Diagnosis not present

## 2023-06-15 DIAGNOSIS — L03031 Cellulitis of right toe: Secondary | ICD-10-CM | POA: Diagnosis not present

## 2023-06-15 DIAGNOSIS — B9689 Other specified bacterial agents as the cause of diseases classified elsewhere: Secondary | ICD-10-CM | POA: Diagnosis not present

## 2023-06-15 DIAGNOSIS — Z7901 Long term (current) use of anticoagulants: Secondary | ICD-10-CM | POA: Diagnosis not present

## 2023-06-15 DIAGNOSIS — Z4800 Encounter for change or removal of nonsurgical wound dressing: Secondary | ICD-10-CM | POA: Diagnosis not present

## 2023-06-15 DIAGNOSIS — I4821 Permanent atrial fibrillation: Secondary | ICD-10-CM | POA: Diagnosis not present

## 2023-06-15 DIAGNOSIS — M86171 Other acute osteomyelitis, right ankle and foot: Secondary | ICD-10-CM | POA: Diagnosis not present

## 2023-06-15 DIAGNOSIS — I10 Essential (primary) hypertension: Secondary | ICD-10-CM | POA: Diagnosis not present

## 2023-06-15 DIAGNOSIS — G629 Polyneuropathy, unspecified: Secondary | ICD-10-CM | POA: Diagnosis not present

## 2023-06-15 DIAGNOSIS — K219 Gastro-esophageal reflux disease without esophagitis: Secondary | ICD-10-CM | POA: Diagnosis not present

## 2023-06-15 DIAGNOSIS — L02611 Cutaneous abscess of right foot: Secondary | ICD-10-CM | POA: Diagnosis not present

## 2023-06-15 DIAGNOSIS — D469 Myelodysplastic syndrome, unspecified: Secondary | ICD-10-CM | POA: Diagnosis not present

## 2023-06-20 DIAGNOSIS — E079 Disorder of thyroid, unspecified: Secondary | ICD-10-CM | POA: Diagnosis not present

## 2023-06-20 DIAGNOSIS — Z89421 Acquired absence of other right toe(s): Secondary | ICD-10-CM | POA: Diagnosis not present

## 2023-06-20 DIAGNOSIS — K219 Gastro-esophageal reflux disease without esophagitis: Secondary | ICD-10-CM | POA: Diagnosis not present

## 2023-06-20 DIAGNOSIS — I10 Essential (primary) hypertension: Secondary | ICD-10-CM | POA: Diagnosis not present

## 2023-06-20 DIAGNOSIS — M86171 Other acute osteomyelitis, right ankle and foot: Secondary | ICD-10-CM | POA: Diagnosis not present

## 2023-06-20 DIAGNOSIS — L089 Local infection of the skin and subcutaneous tissue, unspecified: Secondary | ICD-10-CM | POA: Diagnosis not present

## 2023-06-20 DIAGNOSIS — Z87891 Personal history of nicotine dependence: Secondary | ICD-10-CM | POA: Diagnosis not present

## 2023-06-20 DIAGNOSIS — Z7901 Long term (current) use of anticoagulants: Secondary | ICD-10-CM | POA: Diagnosis not present

## 2023-06-22 DIAGNOSIS — Z4800 Encounter for change or removal of nonsurgical wound dressing: Secondary | ICD-10-CM | POA: Diagnosis not present

## 2023-06-22 DIAGNOSIS — Z7901 Long term (current) use of anticoagulants: Secondary | ICD-10-CM | POA: Diagnosis not present

## 2023-06-22 DIAGNOSIS — K219 Gastro-esophageal reflux disease without esophagitis: Secondary | ICD-10-CM | POA: Diagnosis not present

## 2023-06-22 DIAGNOSIS — I10 Essential (primary) hypertension: Secondary | ICD-10-CM | POA: Diagnosis not present

## 2023-06-22 DIAGNOSIS — G629 Polyneuropathy, unspecified: Secondary | ICD-10-CM | POA: Diagnosis not present

## 2023-06-22 DIAGNOSIS — L02611 Cutaneous abscess of right foot: Secondary | ICD-10-CM | POA: Diagnosis not present

## 2023-06-22 DIAGNOSIS — G4733 Obstructive sleep apnea (adult) (pediatric): Secondary | ICD-10-CM | POA: Diagnosis not present

## 2023-06-22 DIAGNOSIS — B9689 Other specified bacterial agents as the cause of diseases classified elsewhere: Secondary | ICD-10-CM | POA: Diagnosis not present

## 2023-06-22 DIAGNOSIS — I4821 Permanent atrial fibrillation: Secondary | ICD-10-CM | POA: Diagnosis not present

## 2023-06-22 DIAGNOSIS — D469 Myelodysplastic syndrome, unspecified: Secondary | ICD-10-CM | POA: Diagnosis not present

## 2023-06-22 DIAGNOSIS — L03031 Cellulitis of right toe: Secondary | ICD-10-CM | POA: Diagnosis not present

## 2023-06-22 DIAGNOSIS — M86171 Other acute osteomyelitis, right ankle and foot: Secondary | ICD-10-CM | POA: Diagnosis not present

## 2023-06-25 DIAGNOSIS — K219 Gastro-esophageal reflux disease without esophagitis: Secondary | ICD-10-CM | POA: Diagnosis not present

## 2023-06-25 DIAGNOSIS — Z7901 Long term (current) use of anticoagulants: Secondary | ICD-10-CM | POA: Diagnosis not present

## 2023-06-25 DIAGNOSIS — I4821 Permanent atrial fibrillation: Secondary | ICD-10-CM | POA: Diagnosis not present

## 2023-06-25 DIAGNOSIS — Z4800 Encounter for change or removal of nonsurgical wound dressing: Secondary | ICD-10-CM | POA: Diagnosis not present

## 2023-06-25 DIAGNOSIS — I10 Essential (primary) hypertension: Secondary | ICD-10-CM | POA: Diagnosis not present

## 2023-06-25 DIAGNOSIS — L02611 Cutaneous abscess of right foot: Secondary | ICD-10-CM | POA: Diagnosis not present

## 2023-06-25 DIAGNOSIS — B9689 Other specified bacterial agents as the cause of diseases classified elsewhere: Secondary | ICD-10-CM | POA: Diagnosis not present

## 2023-06-25 DIAGNOSIS — G629 Polyneuropathy, unspecified: Secondary | ICD-10-CM | POA: Diagnosis not present

## 2023-06-25 DIAGNOSIS — G4733 Obstructive sleep apnea (adult) (pediatric): Secondary | ICD-10-CM | POA: Diagnosis not present

## 2023-06-25 DIAGNOSIS — L03031 Cellulitis of right toe: Secondary | ICD-10-CM | POA: Diagnosis not present

## 2023-06-25 DIAGNOSIS — D469 Myelodysplastic syndrome, unspecified: Secondary | ICD-10-CM | POA: Diagnosis not present

## 2023-06-25 DIAGNOSIS — M86171 Other acute osteomyelitis, right ankle and foot: Secondary | ICD-10-CM | POA: Diagnosis not present

## 2023-06-28 DIAGNOSIS — L089 Local infection of the skin and subcutaneous tissue, unspecified: Secondary | ICD-10-CM | POA: Diagnosis not present

## 2023-06-29 DIAGNOSIS — L02611 Cutaneous abscess of right foot: Secondary | ICD-10-CM | POA: Diagnosis not present

## 2023-06-29 DIAGNOSIS — K219 Gastro-esophageal reflux disease without esophagitis: Secondary | ICD-10-CM | POA: Diagnosis not present

## 2023-06-29 DIAGNOSIS — G4733 Obstructive sleep apnea (adult) (pediatric): Secondary | ICD-10-CM | POA: Diagnosis not present

## 2023-06-29 DIAGNOSIS — I10 Essential (primary) hypertension: Secondary | ICD-10-CM | POA: Diagnosis not present

## 2023-06-29 DIAGNOSIS — I4821 Permanent atrial fibrillation: Secondary | ICD-10-CM | POA: Diagnosis not present

## 2023-06-29 DIAGNOSIS — L03031 Cellulitis of right toe: Secondary | ICD-10-CM | POA: Diagnosis not present

## 2023-06-29 DIAGNOSIS — Z7901 Long term (current) use of anticoagulants: Secondary | ICD-10-CM | POA: Diagnosis not present

## 2023-06-29 DIAGNOSIS — M86171 Other acute osteomyelitis, right ankle and foot: Secondary | ICD-10-CM | POA: Diagnosis not present

## 2023-06-29 DIAGNOSIS — B9689 Other specified bacterial agents as the cause of diseases classified elsewhere: Secondary | ICD-10-CM | POA: Diagnosis not present

## 2023-06-29 DIAGNOSIS — D469 Myelodysplastic syndrome, unspecified: Secondary | ICD-10-CM | POA: Diagnosis not present

## 2023-06-29 DIAGNOSIS — G629 Polyneuropathy, unspecified: Secondary | ICD-10-CM | POA: Diagnosis not present

## 2023-06-29 DIAGNOSIS — Z4800 Encounter for change or removal of nonsurgical wound dressing: Secondary | ICD-10-CM | POA: Diagnosis not present

## 2023-07-02 DIAGNOSIS — D469 Myelodysplastic syndrome, unspecified: Secondary | ICD-10-CM | POA: Diagnosis not present

## 2023-07-02 DIAGNOSIS — L02611 Cutaneous abscess of right foot: Secondary | ICD-10-CM | POA: Diagnosis not present

## 2023-07-02 DIAGNOSIS — K219 Gastro-esophageal reflux disease without esophagitis: Secondary | ICD-10-CM | POA: Diagnosis not present

## 2023-07-02 DIAGNOSIS — M86171 Other acute osteomyelitis, right ankle and foot: Secondary | ICD-10-CM | POA: Diagnosis not present

## 2023-07-02 DIAGNOSIS — I4821 Permanent atrial fibrillation: Secondary | ICD-10-CM | POA: Diagnosis not present

## 2023-07-02 DIAGNOSIS — G4733 Obstructive sleep apnea (adult) (pediatric): Secondary | ICD-10-CM | POA: Diagnosis not present

## 2023-07-02 DIAGNOSIS — Z7901 Long term (current) use of anticoagulants: Secondary | ICD-10-CM | POA: Diagnosis not present

## 2023-07-02 DIAGNOSIS — Z4800 Encounter for change or removal of nonsurgical wound dressing: Secondary | ICD-10-CM | POA: Diagnosis not present

## 2023-07-02 DIAGNOSIS — L03031 Cellulitis of right toe: Secondary | ICD-10-CM | POA: Diagnosis not present

## 2023-07-02 DIAGNOSIS — B9689 Other specified bacterial agents as the cause of diseases classified elsewhere: Secondary | ICD-10-CM | POA: Diagnosis not present

## 2023-07-02 DIAGNOSIS — G629 Polyneuropathy, unspecified: Secondary | ICD-10-CM | POA: Diagnosis not present

## 2023-07-02 DIAGNOSIS — I10 Essential (primary) hypertension: Secondary | ICD-10-CM | POA: Diagnosis not present

## 2023-07-02 DIAGNOSIS — I480 Paroxysmal atrial fibrillation: Secondary | ICD-10-CM | POA: Diagnosis not present

## 2023-07-04 DIAGNOSIS — L02611 Cutaneous abscess of right foot: Secondary | ICD-10-CM | POA: Diagnosis not present

## 2023-07-04 DIAGNOSIS — L03031 Cellulitis of right toe: Secondary | ICD-10-CM | POA: Diagnosis not present

## 2023-07-04 DIAGNOSIS — G629 Polyneuropathy, unspecified: Secondary | ICD-10-CM | POA: Diagnosis not present

## 2023-07-04 DIAGNOSIS — D469 Myelodysplastic syndrome, unspecified: Secondary | ICD-10-CM | POA: Diagnosis not present

## 2023-07-04 DIAGNOSIS — K219 Gastro-esophageal reflux disease without esophagitis: Secondary | ICD-10-CM | POA: Diagnosis not present

## 2023-07-04 DIAGNOSIS — M86171 Other acute osteomyelitis, right ankle and foot: Secondary | ICD-10-CM | POA: Diagnosis not present

## 2023-07-04 DIAGNOSIS — Z4800 Encounter for change or removal of nonsurgical wound dressing: Secondary | ICD-10-CM | POA: Diagnosis not present

## 2023-07-04 DIAGNOSIS — I4821 Permanent atrial fibrillation: Secondary | ICD-10-CM | POA: Diagnosis not present

## 2023-07-04 DIAGNOSIS — Z7901 Long term (current) use of anticoagulants: Secondary | ICD-10-CM | POA: Diagnosis not present

## 2023-07-04 DIAGNOSIS — B9689 Other specified bacterial agents as the cause of diseases classified elsewhere: Secondary | ICD-10-CM | POA: Diagnosis not present

## 2023-07-04 DIAGNOSIS — G4733 Obstructive sleep apnea (adult) (pediatric): Secondary | ICD-10-CM | POA: Diagnosis not present

## 2023-07-04 DIAGNOSIS — I10 Essential (primary) hypertension: Secondary | ICD-10-CM | POA: Diagnosis not present

## 2023-07-06 DIAGNOSIS — M86171 Other acute osteomyelitis, right ankle and foot: Secondary | ICD-10-CM | POA: Diagnosis not present

## 2023-07-06 DIAGNOSIS — Z7901 Long term (current) use of anticoagulants: Secondary | ICD-10-CM | POA: Diagnosis not present

## 2023-07-06 DIAGNOSIS — K219 Gastro-esophageal reflux disease without esophagitis: Secondary | ICD-10-CM | POA: Diagnosis not present

## 2023-07-06 DIAGNOSIS — L03031 Cellulitis of right toe: Secondary | ICD-10-CM | POA: Diagnosis not present

## 2023-07-06 DIAGNOSIS — L02611 Cutaneous abscess of right foot: Secondary | ICD-10-CM | POA: Diagnosis not present

## 2023-07-06 DIAGNOSIS — Z4800 Encounter for change or removal of nonsurgical wound dressing: Secondary | ICD-10-CM | POA: Diagnosis not present

## 2023-07-06 DIAGNOSIS — B9689 Other specified bacterial agents as the cause of diseases classified elsewhere: Secondary | ICD-10-CM | POA: Diagnosis not present

## 2023-07-06 DIAGNOSIS — G4733 Obstructive sleep apnea (adult) (pediatric): Secondary | ICD-10-CM | POA: Diagnosis not present

## 2023-07-06 DIAGNOSIS — I4821 Permanent atrial fibrillation: Secondary | ICD-10-CM | POA: Diagnosis not present

## 2023-07-06 DIAGNOSIS — G629 Polyneuropathy, unspecified: Secondary | ICD-10-CM | POA: Diagnosis not present

## 2023-07-06 DIAGNOSIS — D469 Myelodysplastic syndrome, unspecified: Secondary | ICD-10-CM | POA: Diagnosis not present

## 2023-07-06 DIAGNOSIS — I10 Essential (primary) hypertension: Secondary | ICD-10-CM | POA: Diagnosis not present

## 2023-07-09 DIAGNOSIS — Z48812 Encounter for surgical aftercare following surgery on the circulatory system: Secondary | ICD-10-CM | POA: Diagnosis not present

## 2023-07-09 DIAGNOSIS — G629 Polyneuropathy, unspecified: Secondary | ICD-10-CM | POA: Diagnosis not present

## 2023-07-09 DIAGNOSIS — M86171 Other acute osteomyelitis, right ankle and foot: Secondary | ICD-10-CM | POA: Diagnosis not present

## 2023-07-09 DIAGNOSIS — L03031 Cellulitis of right toe: Secondary | ICD-10-CM | POA: Diagnosis not present

## 2023-07-09 DIAGNOSIS — Z7901 Long term (current) use of anticoagulants: Secondary | ICD-10-CM | POA: Diagnosis not present

## 2023-07-09 DIAGNOSIS — Z4781 Encounter for orthopedic aftercare following surgical amputation: Secondary | ICD-10-CM | POA: Diagnosis not present

## 2023-07-09 DIAGNOSIS — D469 Myelodysplastic syndrome, unspecified: Secondary | ICD-10-CM | POA: Diagnosis not present

## 2023-07-09 DIAGNOSIS — Z950 Presence of cardiac pacemaker: Secondary | ICD-10-CM | POA: Diagnosis not present

## 2023-07-09 DIAGNOSIS — L02611 Cutaneous abscess of right foot: Secondary | ICD-10-CM | POA: Diagnosis not present

## 2023-07-09 DIAGNOSIS — K219 Gastro-esophageal reflux disease without esophagitis: Secondary | ICD-10-CM | POA: Diagnosis not present

## 2023-07-09 DIAGNOSIS — B9689 Other specified bacterial agents as the cause of diseases classified elsewhere: Secondary | ICD-10-CM | POA: Diagnosis not present

## 2023-07-09 DIAGNOSIS — I4821 Permanent atrial fibrillation: Secondary | ICD-10-CM | POA: Diagnosis not present

## 2023-07-09 DIAGNOSIS — G4733 Obstructive sleep apnea (adult) (pediatric): Secondary | ICD-10-CM | POA: Diagnosis not present

## 2023-07-09 DIAGNOSIS — Z4801 Encounter for change or removal of surgical wound dressing: Secondary | ICD-10-CM | POA: Diagnosis not present

## 2023-07-09 DIAGNOSIS — I442 Atrioventricular block, complete: Secondary | ICD-10-CM | POA: Diagnosis not present

## 2023-07-09 DIAGNOSIS — I10 Essential (primary) hypertension: Secondary | ICD-10-CM | POA: Diagnosis not present

## 2023-07-11 DIAGNOSIS — Z7901 Long term (current) use of anticoagulants: Secondary | ICD-10-CM | POA: Diagnosis not present

## 2023-07-11 DIAGNOSIS — I5021 Acute systolic (congestive) heart failure: Secondary | ICD-10-CM | POA: Diagnosis not present

## 2023-07-11 DIAGNOSIS — D473 Essential (hemorrhagic) thrombocythemia: Secondary | ICD-10-CM | POA: Diagnosis not present

## 2023-07-11 DIAGNOSIS — L02611 Cutaneous abscess of right foot: Secondary | ICD-10-CM | POA: Diagnosis not present

## 2023-07-11 DIAGNOSIS — Z87891 Personal history of nicotine dependence: Secondary | ICD-10-CM | POA: Diagnosis not present

## 2023-07-11 DIAGNOSIS — Z79899 Other long term (current) drug therapy: Secondary | ICD-10-CM | POA: Diagnosis not present

## 2023-07-11 DIAGNOSIS — N179 Acute kidney failure, unspecified: Secondary | ICD-10-CM | POA: Diagnosis not present

## 2023-07-11 DIAGNOSIS — R0602 Shortness of breath: Secondary | ICD-10-CM | POA: Diagnosis not present

## 2023-07-11 DIAGNOSIS — L03031 Cellulitis of right toe: Secondary | ICD-10-CM | POA: Diagnosis not present

## 2023-07-11 DIAGNOSIS — R001 Bradycardia, unspecified: Secondary | ICD-10-CM | POA: Diagnosis not present

## 2023-07-11 DIAGNOSIS — K219 Gastro-esophageal reflux disease without esophagitis: Secondary | ICD-10-CM | POA: Diagnosis not present

## 2023-07-11 DIAGNOSIS — R918 Other nonspecific abnormal finding of lung field: Secondary | ICD-10-CM | POA: Diagnosis not present

## 2023-07-11 DIAGNOSIS — I10 Essential (primary) hypertension: Secondary | ICD-10-CM | POA: Diagnosis not present

## 2023-07-11 DIAGNOSIS — Z86718 Personal history of other venous thrombosis and embolism: Secondary | ICD-10-CM | POA: Diagnosis not present

## 2023-07-11 DIAGNOSIS — D469 Myelodysplastic syndrome, unspecified: Secondary | ICD-10-CM | POA: Diagnosis not present

## 2023-07-11 DIAGNOSIS — Z48812 Encounter for surgical aftercare following surgery on the circulatory system: Secondary | ICD-10-CM | POA: Diagnosis not present

## 2023-07-11 DIAGNOSIS — Z4781 Encounter for orthopedic aftercare following surgical amputation: Secondary | ICD-10-CM | POA: Diagnosis not present

## 2023-07-11 DIAGNOSIS — I110 Hypertensive heart disease with heart failure: Secondary | ICD-10-CM | POA: Diagnosis not present

## 2023-07-11 DIAGNOSIS — J811 Chronic pulmonary edema: Secondary | ICD-10-CM | POA: Diagnosis not present

## 2023-07-11 DIAGNOSIS — G4733 Obstructive sleep apnea (adult) (pediatric): Secondary | ICD-10-CM | POA: Diagnosis not present

## 2023-07-11 DIAGNOSIS — I441 Atrioventricular block, second degree: Secondary | ICD-10-CM | POA: Diagnosis not present

## 2023-07-11 DIAGNOSIS — I4821 Permanent atrial fibrillation: Secondary | ICD-10-CM | POA: Diagnosis not present

## 2023-07-11 DIAGNOSIS — M86171 Other acute osteomyelitis, right ankle and foot: Secondary | ICD-10-CM | POA: Diagnosis not present

## 2023-07-11 DIAGNOSIS — Z1152 Encounter for screening for COVID-19: Secondary | ICD-10-CM | POA: Diagnosis not present

## 2023-07-11 DIAGNOSIS — Z950 Presence of cardiac pacemaker: Secondary | ICD-10-CM | POA: Diagnosis not present

## 2023-07-11 DIAGNOSIS — B9689 Other specified bacterial agents as the cause of diseases classified elsewhere: Secondary | ICD-10-CM | POA: Diagnosis not present

## 2023-07-11 DIAGNOSIS — I442 Atrioventricular block, complete: Secondary | ICD-10-CM | POA: Diagnosis not present

## 2023-07-11 DIAGNOSIS — I480 Paroxysmal atrial fibrillation: Secondary | ICD-10-CM | POA: Diagnosis not present

## 2023-07-11 DIAGNOSIS — G629 Polyneuropathy, unspecified: Secondary | ICD-10-CM | POA: Diagnosis not present

## 2023-07-11 DIAGNOSIS — E039 Hypothyroidism, unspecified: Secondary | ICD-10-CM | POA: Diagnosis not present

## 2023-07-11 DIAGNOSIS — D72829 Elevated white blood cell count, unspecified: Secondary | ICD-10-CM | POA: Diagnosis not present

## 2023-07-11 DIAGNOSIS — Z9889 Other specified postprocedural states: Secondary | ICD-10-CM | POA: Diagnosis not present

## 2023-07-11 DIAGNOSIS — Z4801 Encounter for change or removal of surgical wound dressing: Secondary | ICD-10-CM | POA: Diagnosis not present

## 2023-07-11 DIAGNOSIS — Z20822 Contact with and (suspected) exposure to covid-19: Secondary | ICD-10-CM | POA: Diagnosis not present

## 2023-07-12 DIAGNOSIS — D72829 Elevated white blood cell count, unspecified: Secondary | ICD-10-CM | POA: Diagnosis not present

## 2023-07-12 DIAGNOSIS — R0602 Shortness of breath: Secondary | ICD-10-CM | POA: Diagnosis not present

## 2023-07-12 DIAGNOSIS — Z79899 Other long term (current) drug therapy: Secondary | ICD-10-CM | POA: Diagnosis not present

## 2023-07-12 DIAGNOSIS — G629 Polyneuropathy, unspecified: Secondary | ICD-10-CM | POA: Diagnosis not present

## 2023-07-12 DIAGNOSIS — J90 Pleural effusion, not elsewhere classified: Secondary | ICD-10-CM | POA: Diagnosis not present

## 2023-07-12 DIAGNOSIS — I10 Essential (primary) hypertension: Secondary | ICD-10-CM | POA: Diagnosis not present

## 2023-07-12 DIAGNOSIS — I340 Nonrheumatic mitral (valve) insufficiency: Secondary | ICD-10-CM | POA: Diagnosis not present

## 2023-07-12 DIAGNOSIS — G4733 Obstructive sleep apnea (adult) (pediatric): Secondary | ICD-10-CM | POA: Diagnosis not present

## 2023-07-12 DIAGNOSIS — D539 Nutritional anemia, unspecified: Secondary | ICD-10-CM | POA: Diagnosis not present

## 2023-07-12 DIAGNOSIS — I110 Hypertensive heart disease with heart failure: Secondary | ICD-10-CM | POA: Diagnosis not present

## 2023-07-12 DIAGNOSIS — K219 Gastro-esophageal reflux disease without esophagitis: Secondary | ICD-10-CM | POA: Diagnosis not present

## 2023-07-12 DIAGNOSIS — Z7901 Long term (current) use of anticoagulants: Secondary | ICD-10-CM | POA: Diagnosis not present

## 2023-07-12 DIAGNOSIS — Z89421 Acquired absence of other right toe(s): Secondary | ICD-10-CM | POA: Diagnosis not present

## 2023-07-12 DIAGNOSIS — D693 Immune thrombocytopenic purpura: Secondary | ICD-10-CM | POA: Diagnosis not present

## 2023-07-12 DIAGNOSIS — Z950 Presence of cardiac pacemaker: Secondary | ICD-10-CM | POA: Diagnosis not present

## 2023-07-12 DIAGNOSIS — D45 Polycythemia vera: Secondary | ICD-10-CM | POA: Diagnosis not present

## 2023-07-12 DIAGNOSIS — J811 Chronic pulmonary edema: Secondary | ICD-10-CM | POA: Diagnosis not present

## 2023-07-12 DIAGNOSIS — C946 Myelodysplastic disease, not classified: Secondary | ICD-10-CM | POA: Diagnosis not present

## 2023-07-12 DIAGNOSIS — I480 Paroxysmal atrial fibrillation: Secondary | ICD-10-CM | POA: Diagnosis not present

## 2023-07-12 DIAGNOSIS — M869 Osteomyelitis, unspecified: Secondary | ICD-10-CM | POA: Diagnosis not present

## 2023-07-12 DIAGNOSIS — E039 Hypothyroidism, unspecified: Secondary | ICD-10-CM | POA: Diagnosis not present

## 2023-07-12 DIAGNOSIS — I442 Atrioventricular block, complete: Secondary | ICD-10-CM | POA: Diagnosis not present

## 2023-07-12 DIAGNOSIS — I4891 Unspecified atrial fibrillation: Secondary | ICD-10-CM | POA: Diagnosis not present

## 2023-07-12 DIAGNOSIS — R059 Cough, unspecified: Secondary | ICD-10-CM | POA: Diagnosis not present

## 2023-07-12 DIAGNOSIS — I351 Nonrheumatic aortic (valve) insufficiency: Secondary | ICD-10-CM | POA: Diagnosis not present

## 2023-07-12 DIAGNOSIS — I441 Atrioventricular block, second degree: Secondary | ICD-10-CM | POA: Diagnosis not present

## 2023-07-12 DIAGNOSIS — I509 Heart failure, unspecified: Secondary | ICD-10-CM | POA: Diagnosis not present

## 2023-07-15 DIAGNOSIS — I441 Atrioventricular block, second degree: Secondary | ICD-10-CM | POA: Diagnosis not present

## 2023-07-16 DIAGNOSIS — D469 Myelodysplastic syndrome, unspecified: Secondary | ICD-10-CM | POA: Diagnosis not present

## 2023-07-16 DIAGNOSIS — L03031 Cellulitis of right toe: Secondary | ICD-10-CM | POA: Diagnosis not present

## 2023-07-16 DIAGNOSIS — M86171 Other acute osteomyelitis, right ankle and foot: Secondary | ICD-10-CM | POA: Diagnosis not present

## 2023-07-16 DIAGNOSIS — I442 Atrioventricular block, complete: Secondary | ICD-10-CM | POA: Diagnosis not present

## 2023-07-16 DIAGNOSIS — B9689 Other specified bacterial agents as the cause of diseases classified elsewhere: Secondary | ICD-10-CM | POA: Diagnosis not present

## 2023-07-16 DIAGNOSIS — Z4801 Encounter for change or removal of surgical wound dressing: Secondary | ICD-10-CM | POA: Diagnosis not present

## 2023-07-16 DIAGNOSIS — Z4781 Encounter for orthopedic aftercare following surgical amputation: Secondary | ICD-10-CM | POA: Diagnosis not present

## 2023-07-16 DIAGNOSIS — Z4509 Encounter for adjustment and management of other cardiac device: Secondary | ICD-10-CM | POA: Diagnosis not present

## 2023-07-16 DIAGNOSIS — K219 Gastro-esophageal reflux disease without esophagitis: Secondary | ICD-10-CM | POA: Diagnosis not present

## 2023-07-16 DIAGNOSIS — Z48812 Encounter for surgical aftercare following surgery on the circulatory system: Secondary | ICD-10-CM | POA: Diagnosis not present

## 2023-07-16 DIAGNOSIS — Z7901 Long term (current) use of anticoagulants: Secondary | ICD-10-CM | POA: Diagnosis not present

## 2023-07-16 DIAGNOSIS — I4821 Permanent atrial fibrillation: Secondary | ICD-10-CM | POA: Diagnosis not present

## 2023-07-16 DIAGNOSIS — G4733 Obstructive sleep apnea (adult) (pediatric): Secondary | ICD-10-CM | POA: Diagnosis not present

## 2023-07-16 DIAGNOSIS — Z950 Presence of cardiac pacemaker: Secondary | ICD-10-CM | POA: Diagnosis not present

## 2023-07-16 DIAGNOSIS — G629 Polyneuropathy, unspecified: Secondary | ICD-10-CM | POA: Diagnosis not present

## 2023-07-16 DIAGNOSIS — I10 Essential (primary) hypertension: Secondary | ICD-10-CM | POA: Diagnosis not present

## 2023-07-16 DIAGNOSIS — I480 Paroxysmal atrial fibrillation: Secondary | ICD-10-CM | POA: Diagnosis not present

## 2023-07-16 DIAGNOSIS — L02611 Cutaneous abscess of right foot: Secondary | ICD-10-CM | POA: Diagnosis not present

## 2023-07-18 DIAGNOSIS — I441 Atrioventricular block, second degree: Secondary | ICD-10-CM | POA: Diagnosis not present

## 2023-07-23 DIAGNOSIS — Z4801 Encounter for change or removal of surgical wound dressing: Secondary | ICD-10-CM | POA: Diagnosis not present

## 2023-07-23 DIAGNOSIS — B9689 Other specified bacterial agents as the cause of diseases classified elsewhere: Secondary | ICD-10-CM | POA: Diagnosis not present

## 2023-07-23 DIAGNOSIS — Z4781 Encounter for orthopedic aftercare following surgical amputation: Secondary | ICD-10-CM | POA: Diagnosis not present

## 2023-07-23 DIAGNOSIS — Z950 Presence of cardiac pacemaker: Secondary | ICD-10-CM | POA: Diagnosis not present

## 2023-07-23 DIAGNOSIS — L02611 Cutaneous abscess of right foot: Secondary | ICD-10-CM | POA: Diagnosis not present

## 2023-07-23 DIAGNOSIS — K219 Gastro-esophageal reflux disease without esophagitis: Secondary | ICD-10-CM | POA: Diagnosis not present

## 2023-07-23 DIAGNOSIS — I4821 Permanent atrial fibrillation: Secondary | ICD-10-CM | POA: Diagnosis not present

## 2023-07-23 DIAGNOSIS — M86171 Other acute osteomyelitis, right ankle and foot: Secondary | ICD-10-CM | POA: Diagnosis not present

## 2023-07-23 DIAGNOSIS — I442 Atrioventricular block, complete: Secondary | ICD-10-CM | POA: Diagnosis not present

## 2023-07-23 DIAGNOSIS — D469 Myelodysplastic syndrome, unspecified: Secondary | ICD-10-CM | POA: Diagnosis not present

## 2023-07-23 DIAGNOSIS — Z7901 Long term (current) use of anticoagulants: Secondary | ICD-10-CM | POA: Diagnosis not present

## 2023-07-23 DIAGNOSIS — Z48812 Encounter for surgical aftercare following surgery on the circulatory system: Secondary | ICD-10-CM | POA: Diagnosis not present

## 2023-07-23 DIAGNOSIS — L03031 Cellulitis of right toe: Secondary | ICD-10-CM | POA: Diagnosis not present

## 2023-07-23 DIAGNOSIS — G4733 Obstructive sleep apnea (adult) (pediatric): Secondary | ICD-10-CM | POA: Diagnosis not present

## 2023-07-23 DIAGNOSIS — I10 Essential (primary) hypertension: Secondary | ICD-10-CM | POA: Diagnosis not present

## 2023-07-23 DIAGNOSIS — G629 Polyneuropathy, unspecified: Secondary | ICD-10-CM | POA: Diagnosis not present

## 2023-07-24 DIAGNOSIS — I482 Chronic atrial fibrillation, unspecified: Secondary | ICD-10-CM | POA: Diagnosis not present

## 2023-07-24 DIAGNOSIS — D473 Essential (hemorrhagic) thrombocythemia: Secondary | ICD-10-CM | POA: Diagnosis not present

## 2023-07-27 DIAGNOSIS — I4821 Permanent atrial fibrillation: Secondary | ICD-10-CM | POA: Diagnosis not present

## 2023-07-27 DIAGNOSIS — I442 Atrioventricular block, complete: Secondary | ICD-10-CM | POA: Diagnosis not present

## 2023-07-27 DIAGNOSIS — Z48812 Encounter for surgical aftercare following surgery on the circulatory system: Secondary | ICD-10-CM | POA: Diagnosis not present

## 2023-07-27 DIAGNOSIS — Z7901 Long term (current) use of anticoagulants: Secondary | ICD-10-CM | POA: Diagnosis not present

## 2023-07-27 DIAGNOSIS — B9689 Other specified bacterial agents as the cause of diseases classified elsewhere: Secondary | ICD-10-CM | POA: Diagnosis not present

## 2023-07-27 DIAGNOSIS — Z950 Presence of cardiac pacemaker: Secondary | ICD-10-CM | POA: Diagnosis not present

## 2023-07-27 DIAGNOSIS — Z4781 Encounter for orthopedic aftercare following surgical amputation: Secondary | ICD-10-CM | POA: Diagnosis not present

## 2023-07-27 DIAGNOSIS — K219 Gastro-esophageal reflux disease without esophagitis: Secondary | ICD-10-CM | POA: Diagnosis not present

## 2023-07-27 DIAGNOSIS — G629 Polyneuropathy, unspecified: Secondary | ICD-10-CM | POA: Diagnosis not present

## 2023-07-27 DIAGNOSIS — D469 Myelodysplastic syndrome, unspecified: Secondary | ICD-10-CM | POA: Diagnosis not present

## 2023-07-27 DIAGNOSIS — L03031 Cellulitis of right toe: Secondary | ICD-10-CM | POA: Diagnosis not present

## 2023-07-27 DIAGNOSIS — G4733 Obstructive sleep apnea (adult) (pediatric): Secondary | ICD-10-CM | POA: Diagnosis not present

## 2023-07-27 DIAGNOSIS — M86171 Other acute osteomyelitis, right ankle and foot: Secondary | ICD-10-CM | POA: Diagnosis not present

## 2023-07-27 DIAGNOSIS — I10 Essential (primary) hypertension: Secondary | ICD-10-CM | POA: Diagnosis not present

## 2023-07-27 DIAGNOSIS — Z4801 Encounter for change or removal of surgical wound dressing: Secondary | ICD-10-CM | POA: Diagnosis not present

## 2023-07-27 DIAGNOSIS — L02611 Cutaneous abscess of right foot: Secondary | ICD-10-CM | POA: Diagnosis not present

## 2023-07-31 DIAGNOSIS — E7849 Other hyperlipidemia: Secondary | ICD-10-CM | POA: Diagnosis not present

## 2023-07-31 DIAGNOSIS — E039 Hypothyroidism, unspecified: Secondary | ICD-10-CM | POA: Diagnosis not present

## 2023-07-31 DIAGNOSIS — Z131 Encounter for screening for diabetes mellitus: Secondary | ICD-10-CM | POA: Diagnosis not present

## 2023-07-31 DIAGNOSIS — E875 Hyperkalemia: Secondary | ICD-10-CM | POA: Diagnosis not present

## 2023-07-31 DIAGNOSIS — K219 Gastro-esophageal reflux disease without esophagitis: Secondary | ICD-10-CM | POA: Diagnosis not present

## 2023-07-31 DIAGNOSIS — D649 Anemia, unspecified: Secondary | ICD-10-CM | POA: Diagnosis not present

## 2023-08-03 DIAGNOSIS — L02611 Cutaneous abscess of right foot: Secondary | ICD-10-CM | POA: Diagnosis not present

## 2023-08-03 DIAGNOSIS — G629 Polyneuropathy, unspecified: Secondary | ICD-10-CM | POA: Diagnosis not present

## 2023-08-03 DIAGNOSIS — Z4801 Encounter for change or removal of surgical wound dressing: Secondary | ICD-10-CM | POA: Diagnosis not present

## 2023-08-03 DIAGNOSIS — Z48812 Encounter for surgical aftercare following surgery on the circulatory system: Secondary | ICD-10-CM | POA: Diagnosis not present

## 2023-08-03 DIAGNOSIS — I442 Atrioventricular block, complete: Secondary | ICD-10-CM | POA: Diagnosis not present

## 2023-08-03 DIAGNOSIS — I4821 Permanent atrial fibrillation: Secondary | ICD-10-CM | POA: Diagnosis not present

## 2023-08-03 DIAGNOSIS — L03031 Cellulitis of right toe: Secondary | ICD-10-CM | POA: Diagnosis not present

## 2023-08-03 DIAGNOSIS — D469 Myelodysplastic syndrome, unspecified: Secondary | ICD-10-CM | POA: Diagnosis not present

## 2023-08-03 DIAGNOSIS — Z7901 Long term (current) use of anticoagulants: Secondary | ICD-10-CM | POA: Diagnosis not present

## 2023-08-03 DIAGNOSIS — B9689 Other specified bacterial agents as the cause of diseases classified elsewhere: Secondary | ICD-10-CM | POA: Diagnosis not present

## 2023-08-03 DIAGNOSIS — I10 Essential (primary) hypertension: Secondary | ICD-10-CM | POA: Diagnosis not present

## 2023-08-03 DIAGNOSIS — Z4781 Encounter for orthopedic aftercare following surgical amputation: Secondary | ICD-10-CM | POA: Diagnosis not present

## 2023-08-03 DIAGNOSIS — K219 Gastro-esophageal reflux disease without esophagitis: Secondary | ICD-10-CM | POA: Diagnosis not present

## 2023-08-03 DIAGNOSIS — Z950 Presence of cardiac pacemaker: Secondary | ICD-10-CM | POA: Diagnosis not present

## 2023-08-03 DIAGNOSIS — M86171 Other acute osteomyelitis, right ankle and foot: Secondary | ICD-10-CM | POA: Diagnosis not present

## 2023-08-03 DIAGNOSIS — G4733 Obstructive sleep apnea (adult) (pediatric): Secondary | ICD-10-CM | POA: Diagnosis not present

## 2023-08-06 DIAGNOSIS — I442 Atrioventricular block, complete: Secondary | ICD-10-CM | POA: Diagnosis not present

## 2023-08-06 DIAGNOSIS — L03031 Cellulitis of right toe: Secondary | ICD-10-CM | POA: Diagnosis not present

## 2023-08-06 DIAGNOSIS — D519 Vitamin B12 deficiency anemia, unspecified: Secondary | ICD-10-CM | POA: Diagnosis not present

## 2023-08-06 DIAGNOSIS — D469 Myelodysplastic syndrome, unspecified: Secondary | ICD-10-CM | POA: Diagnosis not present

## 2023-08-06 DIAGNOSIS — K219 Gastro-esophageal reflux disease without esophagitis: Secondary | ICD-10-CM | POA: Diagnosis not present

## 2023-08-06 DIAGNOSIS — L02611 Cutaneous abscess of right foot: Secondary | ICD-10-CM | POA: Diagnosis not present

## 2023-08-06 DIAGNOSIS — Z23 Encounter for immunization: Secondary | ICD-10-CM | POA: Diagnosis not present

## 2023-08-06 DIAGNOSIS — B9689 Other specified bacterial agents as the cause of diseases classified elsewhere: Secondary | ICD-10-CM | POA: Diagnosis not present

## 2023-08-06 DIAGNOSIS — Z950 Presence of cardiac pacemaker: Secondary | ICD-10-CM | POA: Diagnosis not present

## 2023-08-06 DIAGNOSIS — G4733 Obstructive sleep apnea (adult) (pediatric): Secondary | ICD-10-CM | POA: Diagnosis not present

## 2023-08-06 DIAGNOSIS — Z4781 Encounter for orthopedic aftercare following surgical amputation: Secondary | ICD-10-CM | POA: Diagnosis not present

## 2023-08-06 DIAGNOSIS — Z48812 Encounter for surgical aftercare following surgery on the circulatory system: Secondary | ICD-10-CM | POA: Diagnosis not present

## 2023-08-06 DIAGNOSIS — G629 Polyneuropathy, unspecified: Secondary | ICD-10-CM | POA: Diagnosis not present

## 2023-08-06 DIAGNOSIS — I4821 Permanent atrial fibrillation: Secondary | ICD-10-CM | POA: Diagnosis not present

## 2023-08-06 DIAGNOSIS — M86171 Other acute osteomyelitis, right ankle and foot: Secondary | ICD-10-CM | POA: Diagnosis not present

## 2023-08-06 DIAGNOSIS — I10 Essential (primary) hypertension: Secondary | ICD-10-CM | POA: Diagnosis not present

## 2023-08-06 DIAGNOSIS — Z4801 Encounter for change or removal of surgical wound dressing: Secondary | ICD-10-CM | POA: Diagnosis not present

## 2023-08-06 DIAGNOSIS — E782 Mixed hyperlipidemia: Secondary | ICD-10-CM | POA: Diagnosis not present

## 2023-08-06 DIAGNOSIS — R030 Elevated blood-pressure reading, without diagnosis of hypertension: Secondary | ICD-10-CM | POA: Diagnosis not present

## 2023-08-06 DIAGNOSIS — Z7901 Long term (current) use of anticoagulants: Secondary | ICD-10-CM | POA: Diagnosis not present

## 2023-08-09 DIAGNOSIS — I10 Essential (primary) hypertension: Secondary | ICD-10-CM | POA: Diagnosis not present

## 2023-09-03 DIAGNOSIS — M79674 Pain in right toe(s): Secondary | ICD-10-CM | POA: Diagnosis not present

## 2023-09-03 DIAGNOSIS — L565 Disseminated superficial actinic porokeratosis (DSAP): Secondary | ICD-10-CM | POA: Diagnosis not present

## 2023-09-03 DIAGNOSIS — L110 Acquired keratosis follicularis: Secondary | ICD-10-CM | POA: Diagnosis not present

## 2023-09-03 DIAGNOSIS — M79671 Pain in right foot: Secondary | ICD-10-CM | POA: Diagnosis not present

## 2023-09-03 DIAGNOSIS — S93331D Other subluxation of right foot, subsequent encounter: Secondary | ICD-10-CM | POA: Diagnosis not present

## 2023-09-16 DIAGNOSIS — Z950 Presence of cardiac pacemaker: Secondary | ICD-10-CM | POA: Diagnosis not present

## 2023-09-16 DIAGNOSIS — I442 Atrioventricular block, complete: Secondary | ICD-10-CM | POA: Diagnosis not present

## 2023-09-17 ENCOUNTER — Telehealth: Payer: Self-pay | Admitting: *Deleted

## 2023-09-17 NOTE — Telephone Encounter (Signed)
-----   Message from Nurse Neeti Knudtson P sent at 11/24/2022 12:28 PM EDT ----- Regarding: CT in March 2025 Pt needs follow up CT 09/2023 for dilated aorta

## 2023-09-17 NOTE — Telephone Encounter (Signed)
 Left message to call back

## 2023-09-27 DIAGNOSIS — L570 Actinic keratosis: Secondary | ICD-10-CM | POA: Diagnosis not present

## 2023-09-27 DIAGNOSIS — X32XXXD Exposure to sunlight, subsequent encounter: Secondary | ICD-10-CM | POA: Diagnosis not present

## 2023-10-01 DIAGNOSIS — M79674 Pain in right toe(s): Secondary | ICD-10-CM | POA: Diagnosis not present

## 2023-10-01 DIAGNOSIS — E1140 Type 2 diabetes mellitus with diabetic neuropathy, unspecified: Secondary | ICD-10-CM | POA: Diagnosis not present

## 2023-10-01 DIAGNOSIS — L02611 Cutaneous abscess of right foot: Secondary | ICD-10-CM | POA: Diagnosis not present

## 2023-10-01 DIAGNOSIS — M79671 Pain in right foot: Secondary | ICD-10-CM | POA: Diagnosis not present

## 2023-10-01 DIAGNOSIS — L110 Acquired keratosis follicularis: Secondary | ICD-10-CM | POA: Diagnosis not present

## 2023-10-02 ENCOUNTER — Emergency Department (HOSPITAL_COMMUNITY)

## 2023-10-02 ENCOUNTER — Other Ambulatory Visit: Payer: Self-pay

## 2023-10-02 ENCOUNTER — Encounter (HOSPITAL_COMMUNITY): Payer: Self-pay

## 2023-10-02 ENCOUNTER — Encounter: Payer: Self-pay | Admitting: Podiatry

## 2023-10-02 ENCOUNTER — Inpatient Hospital Stay (HOSPITAL_COMMUNITY)
Admission: EM | Admit: 2023-10-02 | Discharge: 2023-10-06 | DRG: 475 | Disposition: A | Discharge status: Home / Self Care | Attending: Internal Medicine | Admitting: Internal Medicine

## 2023-10-02 ENCOUNTER — Ambulatory Visit (INDEPENDENT_AMBULATORY_CARE_PROVIDER_SITE_OTHER)

## 2023-10-02 ENCOUNTER — Observation Stay (HOSPITAL_COMMUNITY)

## 2023-10-02 ENCOUNTER — Ambulatory Visit: Admitting: Podiatry

## 2023-10-02 VITALS — BP 123/60 | HR 62 | Temp 97.7°F | Resp 16

## 2023-10-02 DIAGNOSIS — M21611 Bunion of right foot: Secondary | ICD-10-CM | POA: Diagnosis not present

## 2023-10-02 DIAGNOSIS — I4891 Unspecified atrial fibrillation: Secondary | ICD-10-CM | POA: Diagnosis not present

## 2023-10-02 DIAGNOSIS — G629 Polyneuropathy, unspecified: Secondary | ICD-10-CM | POA: Diagnosis not present

## 2023-10-02 DIAGNOSIS — Z79890 Hormone replacement therapy (postmenopausal): Secondary | ICD-10-CM

## 2023-10-02 DIAGNOSIS — M869 Osteomyelitis, unspecified: Principal | ICD-10-CM | POA: Diagnosis present

## 2023-10-02 DIAGNOSIS — M2031 Hallux varus (acquired), right foot: Secondary | ICD-10-CM | POA: Diagnosis present

## 2023-10-02 DIAGNOSIS — Z79899 Other long term (current) drug therapy: Secondary | ICD-10-CM | POA: Diagnosis not present

## 2023-10-02 DIAGNOSIS — Z7901 Long term (current) use of anticoagulants: Secondary | ICD-10-CM

## 2023-10-02 DIAGNOSIS — Z950 Presence of cardiac pacemaker: Secondary | ICD-10-CM

## 2023-10-02 DIAGNOSIS — M86271 Subacute osteomyelitis, right ankle and foot: Secondary | ICD-10-CM | POA: Diagnosis not present

## 2023-10-02 DIAGNOSIS — M2041 Other hammer toe(s) (acquired), right foot: Secondary | ICD-10-CM | POA: Diagnosis present

## 2023-10-02 DIAGNOSIS — N401 Enlarged prostate with lower urinary tract symptoms: Secondary | ICD-10-CM | POA: Diagnosis not present

## 2023-10-02 DIAGNOSIS — M86171 Other acute osteomyelitis, right ankle and foot: Principal | ICD-10-CM | POA: Diagnosis present

## 2023-10-02 DIAGNOSIS — L02619 Cutaneous abscess of unspecified foot: Secondary | ICD-10-CM | POA: Diagnosis not present

## 2023-10-02 DIAGNOSIS — N138 Other obstructive and reflux uropathy: Secondary | ICD-10-CM | POA: Diagnosis not present

## 2023-10-02 DIAGNOSIS — L03115 Cellulitis of right lower limb: Secondary | ICD-10-CM | POA: Diagnosis not present

## 2023-10-02 DIAGNOSIS — E785 Hyperlipidemia, unspecified: Secondary | ICD-10-CM | POA: Diagnosis present

## 2023-10-02 DIAGNOSIS — Z8249 Family history of ischemic heart disease and other diseases of the circulatory system: Secondary | ICD-10-CM | POA: Diagnosis not present

## 2023-10-02 DIAGNOSIS — L03119 Cellulitis of unspecified part of limb: Secondary | ICD-10-CM

## 2023-10-02 DIAGNOSIS — Z841 Family history of disorders of kidney and ureter: Secondary | ICD-10-CM

## 2023-10-02 DIAGNOSIS — L97412 Non-pressure chronic ulcer of right heel and midfoot with fat layer exposed: Secondary | ICD-10-CM | POA: Diagnosis not present

## 2023-10-02 DIAGNOSIS — I10 Essential (primary) hypertension: Secondary | ICD-10-CM | POA: Diagnosis present

## 2023-10-02 DIAGNOSIS — G2581 Restless legs syndrome: Secondary | ICD-10-CM | POA: Diagnosis present

## 2023-10-02 DIAGNOSIS — Z8042 Family history of malignant neoplasm of prostate: Secondary | ICD-10-CM

## 2023-10-02 DIAGNOSIS — M79671 Pain in right foot: Secondary | ICD-10-CM | POA: Diagnosis not present

## 2023-10-02 DIAGNOSIS — Z7982 Long term (current) use of aspirin: Secondary | ICD-10-CM | POA: Diagnosis not present

## 2023-10-02 DIAGNOSIS — E08621 Diabetes mellitus due to underlying condition with foot ulcer: Secondary | ICD-10-CM

## 2023-10-02 DIAGNOSIS — E039 Hypothyroidism, unspecified: Secondary | ICD-10-CM | POA: Insufficient documentation

## 2023-10-02 DIAGNOSIS — D45 Polycythemia vera: Secondary | ICD-10-CM

## 2023-10-02 DIAGNOSIS — Z7902 Long term (current) use of antithrombotics/antiplatelets: Secondary | ICD-10-CM | POA: Diagnosis not present

## 2023-10-02 DIAGNOSIS — L97519 Non-pressure chronic ulcer of other part of right foot with unspecified severity: Secondary | ICD-10-CM | POA: Diagnosis present

## 2023-10-02 DIAGNOSIS — E1169 Type 2 diabetes mellitus with other specified complication: Secondary | ICD-10-CM | POA: Diagnosis not present

## 2023-10-02 DIAGNOSIS — Z66 Do not resuscitate: Secondary | ICD-10-CM | POA: Diagnosis not present

## 2023-10-02 DIAGNOSIS — L02611 Cutaneous abscess of right foot: Secondary | ICD-10-CM | POA: Diagnosis not present

## 2023-10-02 DIAGNOSIS — D473 Essential (hemorrhagic) thrombocythemia: Secondary | ICD-10-CM | POA: Diagnosis present

## 2023-10-02 DIAGNOSIS — Z86718 Personal history of other venous thrombosis and embolism: Secondary | ICD-10-CM

## 2023-10-02 HISTORY — DX: Presence of cardiac pacemaker: Z95.0

## 2023-10-02 LAB — CBC WITH DIFFERENTIAL/PLATELET
Abs Immature Granulocytes: 1.1 10*3/uL — ABNORMAL HIGH (ref 0.00–0.07)
Band Neutrophils: 1 %
Basophils Absolute: 0 10*3/uL (ref 0.0–0.1)
Basophils Relative: 0 %
Eosinophils Absolute: 0 10*3/uL (ref 0.0–0.5)
Eosinophils Relative: 0 %
HCT: 34.3 % — ABNORMAL LOW (ref 39.0–52.0)
Hemoglobin: 11.5 g/dL — ABNORMAL LOW (ref 13.0–17.0)
Lymphocytes Relative: 11 %
Lymphs Abs: 1.5 10*3/uL (ref 0.7–4.0)
MCH: 35.7 pg — ABNORMAL HIGH (ref 26.0–34.0)
MCHC: 33.5 g/dL (ref 30.0–36.0)
MCV: 106.5 fL — ABNORMAL HIGH (ref 80.0–100.0)
Metamyelocytes Relative: 4 %
Monocytes Absolute: 0.8 10*3/uL (ref 0.1–1.0)
Monocytes Relative: 6 %
Myelocytes: 3 %
Neutro Abs: 10.1 10*3/uL — ABNORMAL HIGH (ref 1.7–7.7)
Neutrophils Relative %: 74 %
Platelets: 369 10*3/uL (ref 150–400)
Promyelocytes Relative: 1 %
RBC: 3.22 MIL/uL — ABNORMAL LOW (ref 4.22–5.81)
RDW: 15 % (ref 11.5–15.5)
Smear Review: NORMAL
WBC: 13.5 10*3/uL — ABNORMAL HIGH (ref 4.0–10.5)
nRBC: 0.2 % (ref 0.0–0.2)

## 2023-10-02 LAB — BASIC METABOLIC PANEL WITH GFR
Anion gap: 9 (ref 5–15)
BUN: 10 mg/dL (ref 8–23)
CO2: 23 mmol/L (ref 22–32)
Calcium: 9.4 mg/dL (ref 8.9–10.3)
Chloride: 103 mmol/L (ref 98–111)
Creatinine, Ser: 0.94 mg/dL (ref 0.61–1.24)
GFR, Estimated: >60 mL/min
Glucose, Bld: 106 mg/dL — ABNORMAL HIGH (ref 70–99)
Potassium: 3.9 mmol/L (ref 3.5–5.1)
Sodium: 135 mmol/L (ref 135–145)

## 2023-10-02 LAB — LACTIC ACID, PLASMA: Lactic Acid, Venous: 0.6 mmol/L (ref 0.5–1.9)

## 2023-10-02 MED ORDER — VANCOMYCIN HCL 1500 MG/300ML IV SOLN
1500.0000 mg | Freq: Once | INTRAVENOUS | Status: AC
  Administered 2023-10-02: 1500 mg via INTRAVENOUS
  Filled 2023-10-02: qty 300

## 2023-10-02 MED ORDER — AMOXICILLIN-POT CLAVULANATE 875-125 MG PO TABS
1.0000 | ORAL_TABLET | Freq: Two times a day (BID) | ORAL | 0 refills | Status: DC
Start: 2023-10-02 — End: 2023-10-06

## 2023-10-02 MED ORDER — VANCOMYCIN HCL IN DEXTROSE 1-5 GM/200ML-% IV SOLN
1000.0000 mg | Freq: Once | INTRAVENOUS | Status: DC
Start: 2023-10-02 — End: 2023-10-02

## 2023-10-02 MED ORDER — GABAPENTIN 300 MG PO CAPS
300.0000 mg | ORAL_CAPSULE | Freq: Two times a day (BID) | ORAL | Status: DC
  Administered 2023-10-02 – 2023-10-06 (×8): 300 mg via ORAL
  Filled 2023-10-02 (×8): qty 1

## 2023-10-02 MED ORDER — HYDROXYUREA 500 MG PO CAPS
1000.0000 mg | ORAL_CAPSULE | Freq: Every day | ORAL | Status: DC
  Administered 2023-10-03 – 2023-10-06 (×4): 1000 mg via ORAL
  Filled 2023-10-02 (×4): qty 2

## 2023-10-02 MED ORDER — CIPROFLOXACIN HCL 500 MG PO TABS
500.0000 mg | ORAL_TABLET | Freq: Two times a day (BID) | ORAL | 0 refills | Status: DC
Start: 2023-10-02 — End: 2023-10-06

## 2023-10-02 MED ORDER — LEVOTHYROXINE SODIUM 25 MCG PO TABS
125.0000 ug | ORAL_TABLET | Freq: Every day | ORAL | Status: DC
  Administered 2023-10-03 – 2023-10-06 (×4): 125 ug via ORAL
  Filled 2023-10-02 (×4): qty 1

## 2023-10-02 MED ORDER — LEVOTHYROXINE SODIUM 88 MCG PO TABS: 88.0000 ug | ORAL_TABLET | Freq: Every day | ORAL | Status: DC

## 2023-10-02 MED ORDER — LEVOTHYROXINE SODIUM 25 MCG PO TABS: 125.0000 ug | ORAL_TABLET | Freq: Every day | ORAL | Status: DC

## 2023-10-02 MED ORDER — IOHEXOL 350 MG/ML SOLN
75.0000 mL | Freq: Once | INTRAVENOUS | Status: AC | PRN
  Administered 2023-10-02: 75 mL via INTRAVENOUS

## 2023-10-02 MED ORDER — TAMSULOSIN HCL 0.4 MG PO CAPS
0.4000 mg | ORAL_CAPSULE | Freq: Every day | ORAL | Status: DC
  Administered 2023-10-03 – 2023-10-05 (×3): 0.4 mg via ORAL
  Filled 2023-10-02 (×3): qty 1

## 2023-10-02 NOTE — ED Notes (Signed)
 Patient transported to CT

## 2023-10-02 NOTE — ED Provider Notes (Signed)
 [place] EMERGENCY DEPARTMENT AT [facility] Provider Note   CSN: [id number] Arrival date & time: 10/02/23  1133     History {Add pertinent medical, surgical, social history, OB history to HPI:1} Chief Complaint  Patient presents with   Rt foot infection    Alexander Maynard is a 78 y.o. male.  HPI   This patient is a 78 year old male, he is currently prescribed Augmentin as of today as well as ciprofloxacin as of today because of what appears to be a right foot infection.  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  He states that over the last week or so he has had some progressive swelling and mild redness of the distal aspect of his right foot, he was seen by his podiatrist and referred to foot and ankle specialist in [place] today.  They took a look at his foot and asked him to come to the hospital for imaging to make sure there is no signs of osteomyelitis.  The patient denies significant pain, no significant foul smell or drainage, he has had a couple of wounds on the bottom of the foot which required a small amount of debriding by podiatry today.  He denies fevers or chills nausea or vomiting and has no significant pain in his leg otherwise.  He did have a PICC line over the last few months for antibiotics for a similar infection in his foot but no longer takes those antibiotics.  Home Medications Prior to Admission medications   Medication Sig Start Date End Date Taking? Authorizing Provider  amoxicillin-clavulanate (AUGMENTIN) 875-125 MG tablet Take 1 tablet by mouth 2 (two) times daily. 10/02/23   Vivi Barrack, DPM  aspirin 81 MG EC tablet Take 81 mg by mouth daily. 01/10/16   [provider]  ciprofloxacin (CIPRO) 500 MG tablet Take 1 tablet (500 mg total) by mouth 2 (two) times daily. 10/02/23   Vivi Barrack, DPM  DAPTOmycin (CUBICIN)  500 MG injection Inject into the vein. Patient not taking: Reported on 10/02/2023 05/07/23   [provider]  flecainide (TAMBOCOR) 50 MG tablet Take 1 tablet by mouth twice daily 09/07/22   Camnitz, Andree Coss, MD  gabapentin (NEURONTIN) 300 MG capsule Take 300 mg by mouth 2 (two) times daily.    [provider]  hydroxyurea (HYDREA) 500 MG capsule Take 1,000 mg by mouth daily. May take with food to minimize GI side effects.    [provider]  levothyroxine (SYNTHROID) 88 MCG tablet Take 88 mcg by mouth daily before breakfast.    [provider]  metoprolol succinate (TOPROL-XL) 25 MG 24 hr tablet TAKE 1 TABLET BY MOUTH ONCE DAILY AT BEDTIME .  TAKE  WITH  OR  IMMEDIATELY  FOLLOWING  A  MEAL Patient taking differently: Take 25 mg by mouth at bedtime. 09/07/22   Camnitz, Andree Coss, MD  metroNIDAZOLE (FLAGYL) 500 MG tablet Take 500 mg by mouth 3 (three) times daily. Patient not taking: Reported on 10/02/2023 04/23/23   [provider]  Multiple Vitamin (MULTIVITAMIN WITH MINERALS) TABS tablet Take 1 tablet by mouth daily.    [provider]  omeprazole (PRILOSEC) 20 MG capsule Take 20 mg by mouth daily.    [provider]  rivaroxaban (XARELTO) 20 MG TABS tablet Take 20 mg by mouth every morning.  [provider]  tamsulosin (FLOMAX) 0.4 MG CAPS capsule Take 0.4 mg by mouth in the morning and at bedtime.    [provider]      Allergies    Patient has no known allergies.    Review of Systems   Review of Systems  All other systems reviewed and are negative.   Physical Exam Updated Vital Signs BP 117/62 (BP Location: Right Arm)   Pulse 79   Temp 97.6 F (36.4 C)   Resp 16   Ht 1.778 m (5\' 10" )   Wt 83.9 kg   SpO2 100%   BMI 26.54 kg/m  Physical Exam Vitals and nursing note reviewed.  Constitutional:      General: He is not in acute distress.    Appearance: He is well-developed.  HENT:     Head:  Normocephalic and atraumatic.     Mouth/Throat:     Pharynx: No oropharyngeal exudate.  Eyes:     General: No scleral icterus.       Right eye: No discharge.        Left eye: No discharge.     Conjunctiva/sclera: Conjunctivae normal.     Pupils: Pupils are equal, round, and reactive to light.  Neck:     Thyroid: No thyromegaly.     Vascular: No JVD.  Cardiovascular:     Rate and Rhythm: Normal rate and regular rhythm.     Heart sounds: Normal heart sounds. No murmur heard.    No friction rub. No gallop.  Pulmonary:     Effort: Pulmonary effort is normal. No respiratory distress.     Breath sounds: Normal breath sounds. No wheezing or rales.  Abdominal:     General: Bowel sounds are normal. There is no distension.     Palpations: Abdomen is soft. There is no mass.     Tenderness: There is no abdominal tenderness.  Musculoskeletal:        General: No tenderness. Normal range of motion.     Cervical back: Normal range of motion and neck supple.     Left lower leg: No edema.     Comments: Right foot with swelling over the mid to distal foot, there is slight redness over the first MTP and great toe, there are 2 small ulcerations one of the distal tip of the toe and one in the pad of the foot, no foul smell, no purulent drainage, no redness ascending the leg.  Lymphadenopathy:     Cervical: No cervical adenopathy.  Skin:    General: Skin is warm and dry.     Findings: No erythema or rash.  Neurological:     Mental Status: He is alert.     Coordination: Coordination normal.  Psychiatric:        Behavior: Behavior normal.     ED Results / Procedures / Treatments   Labs (all labs ordered are listed, but only abnormal results are displayed) Labs Reviewed  CBC WITH DIFFERENTIAL/PLATELET - Abnormal; Notable for the following components:      Result Value   WBC 13.5 (*)    RBC 3.22 (*)    Hemoglobin 11.5 (*)    HCT 34.3 (*)    MCV 106.5 (*)    MCH 35.7 (*)    Neutro Abs 10.1  (*)    Abs Immature Granulocytes 1.10 (*)    All other components within normal limits  BASIC METABOLIC PANEL - Abnormal; Notable for the following components:  Glucose, Bld 106 (*)    All other components within normal limits  CULTURE, BLOOD (ROUTINE X 2)  CULTURE, BLOOD (ROUTINE X 2)  LACTIC ACID, PLASMA  LACTIC ACID, PLASMA  PATHOLOGIST SMEAR REVIEW    EKG None  Radiology DG Foot Complete Right Result Date: 10/02/2023 CLINICAL DATA:  infection EXAM: RIGHT FOOT COMPLETE - 3+ VIEW COMPARISON:  CT 05/08/2023 FINDINGS: Progressive cortical loss at the medial and lateral margins of the head of the second metatarsal. Previous amputation across the second MTP joint. Chronic subluxation of the great toe interphalangeal joint. Moderate hallux valgus deformity with DJD at the first MTP joint stable. Calcaneal spur. IMPRESSION: Progressive cortical loss at the head of the second metatarsal, consistent with osteomyelitis. Electronically Signed   By: Corlis Leak M.D.   On: 10/02/2023 16:31    Procedures Procedures  {Document cardiac monitor, telemetry assessment procedure when appropriate:1}  Medications Ordered in ED Medications  vancomycin (VANCOCIN) IVPB 1000 mg/200 mL premix (has no administration in time range)    ED Course/ Medical Decision Making/ A&P   {   Click here for ABCD2, HEART and other calculatorsREFRESH Note before signing :1}                              Medical Decision Making Amount and/or Complexity of Data Reviewed Radiology: ordered.    This patient presents to the ED for concern of swelling and redness of the foot, this involves an extensive number of treatment options, and is a complaint that carries with it a high risk of complications and morbidity.  The differential diagnosis includes osteomyelitis, skin and soft tissue infection [place]   Co morbidities that complicate the patient evaluation  Neuropathy   Additional history  obtained:  Additional history obtained from the record as well as primary discussion with the podiatrist Dr. Loreta Ave External records from outside source obtained and reviewed including office records from Dr. Loreta Ave, he states that Dr. Annamary Rummage will see the patient in the hospital tomorrow and wants the patient admitted for osteomyelitis   Lab Tests:  I Ordered, and personally interpreted labs.  The pertinent results include: CBC with a leukocytosis of 13,000   Imaging Studies ordered:  I ordered imaging studies including x-ray shows metatarsal involvement osteomyelitis I independently visualized and interpreted imaging which showed osteomyelitis I agree with the radiologist interpretation   Cardiac Monitoring: / EKG:  The patient was maintained on a cardiac monitor.  I personally viewed and interpreted the cardiac monitored which showed an underlying rhythm of: Normal sinus rhythm   Consultations Obtained:  I requested consultation with the hospitalist,  and discussed lab and imaging findings as well as pertinent plan - they recommend: Admission   Problem List / ED Course / Critical interventions / Medication management  Osteomyelitis, not septic I ordered medication including vancomycin for osteomyelitis Reevaluation of the patient after these medicines showed that the patient unchanged I have reviewed the patients home medicines and have made adjustments as needed   Social Determinants of Health:  Will need to be admitted according to the podiatrist Dr. Loreta Ave   Test / Admission - Considered:  Admit to hospital   {Document critical care time when appropriate:1} {Document review of labs and clinical decision tools ie heart score, Chads2Vasc2 etc:1}  {Document your independent review of radiology images, and any outside records:1} {Document your discussion with family members, caretakers, and with consultants:1} {Document social determinants of health  affecting  pt's care:1} {Document your decision making why or why not admission, treatments were needed:1} Final Clinical Impression(s) / ED Diagnoses Final diagnoses:  None    Rx / DC Orders ED Discharge Orders     None

## 2023-10-02 NOTE — ED Provider Triage Note (Signed)
 Emergency Medicine Provider Triage Evaluation Note  Alexander Maynard , a 77 y.o. male  was evaluated in triage.  Pt complains of foot swelling.  Review of Systems  Positive:  Negative:   Physical Exam  BP 117/62 (BP Location: Right Arm)   Pulse 79   Temp 97.6 F (36.4 C)   Resp 16   Ht 5\' 10"  (1.778 m)   Wt 83.9 kg   SpO2 100%   BMI 26.54 kg/m  Gen:   Awake, no distress   Resp:  Normal effort  MSK:   Moves extremities without difficulty  Other:    Medical Decision Making  Medically screening exam initiated at 1:31 PM.  Appropriate orders placed.  Alexander Maynard was informed that the remainder of the evaluation will be completed by another provider, this initial triage assessment does not replace that evaluation, and the importance of remaining in the ED until their evaluation is complete.  Patient had wound on this right foot month ago that resolved - but then reappeared 4 weeks ago. Patient was sent to ED to further assess for spreading infection. Patient stating that he has not been on ABX for many weeks.   Dorthy Cooler, [place] 10/02/23 1334

## 2023-10-02 NOTE — ED Notes (Signed)
 EDP at bedside

## 2023-10-02 NOTE — Progress Notes (Unsigned)
 Subjective:   Patient ID: Alexander Maynard, male   DOB: 78 y.o.   MRN: [id number]   HPI Chief Complaint  Patient presents with   Foot Pain    RM#14 *urgent referral* new pt-inflammed ulcer under R 2nd metatarsal;whole foot is inflammed- Foot is swollen and red at the big toe area.   Dr. Clide Cliff in [place]-    ROS      Objective:  Physical Exam  ***     Assessment:  ***     Plan:  ***

## 2023-10-02 NOTE — ED Notes (Signed)
 Per MRI, CXR is needed prior to MRI for pt having a pacemaker. Pt does not know make/model of pacemaker to determine if it is MRI compatible. MRI will happen tomorrow, per MRI tech. Providers made aware

## 2023-10-02 NOTE — H&P (Cosign Needed)
 Date: 10/03/2023         Patient Name:  Alexander Maynard MRN: [id number]  DOB: [birth year] Age / Sex: 78 y.o., male   PCP: Richardean Chimera, MD         Medical Service: Internal Medicine Teaching Service         Attending Physician: Dr. Mercie Eon, MD    First Contact: Dr. Kathleen Lime, MD Pager [phone] Pager: [phone]  Second Contact: Dr. 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   Patient reports he was fine until last week when his wife told him his foot was swollen so he went to his primary care physician  and was started on Augmentin and Ciprofloxacin and made an appointment to see the foot doctor today. He has been taking his antibiotics as prescribed until he went to see to the  foot doctor today, they sent him to the ED to get an MRI due to concerns for osteomyelitis.  On arrival to the ED, his right foot looked very infected and the ED thought the benefit from inpatient admission and IV antibiotics prior to getting the MRI done. The IMTS was then paged for admission. Patient denies foot pain, fevers, chills, shortness of breath or any chest pain.    Allergies: No Known Allergies   Past Medical History: Atrial fibrillation Essential hypertension Hyperlipidemia Hypothyroidism Secondary polycythemia Restless leg syndrome BPH with obstruction  Medications: Augmentin and ciprofloxacin  Synthroid 88 mcg daily Metoprolol 25 mg by mouth daily Xarelto 20 mg every morning Flomax 0.4 mg every morning and at bedtime Penton 300 mg 2 times  daily Flecainide  50 mg  Hydroxyurea 1000 mg daily  Omeprazole 20 mg daily  Surgical History: Inguinal hernia repair 1998 Transurethral resection of prostate 2016  Family History:  Heart attack in the father and prostate cancer and chronic renal failure and his brother  Social History:  Alexander. Feister works for the [employer]  2 days a week, lives with his wife at home home he takes care of.  He is independent on all of his ADLs and IADLs.  He has no current or previous history of alcohol tobacco or any illicit drug use.  His wife Alexander Maynard is his decision-maker and elected to be DNR at this time  Physical Exam: Blood pressure 117/62, pulse 79, temperature 97.6 F (36.4 C), resp. rate 16, height 5\' 10"  (1.778 m), weight 83.9 kg, SpO2 100%.  Constitutional: well-appearing man, sitting in bed, in no acute distress HENT: normocephalic atraumatic, mucous membranes moist Cardiovascular: regular rate and rhythm, no m/r/g, no  JVD Pulmonary/Chest: normal work of breathing on room air, lungs clear to auscultation bilaterally.  Abdominal: soft, non-tender, non-distended Neurological: alert & oriented x 3 MSK:  Skin: warm and dry Psych: Normal mood and affect   Labs:    Latest Ref Rng & Units 10/03/2023    4:33 AM 10/02/2023    1:51 PM 05/08/2023    3:39 PM  CBC  WBC 4.0 - 10.5  K/uL 11.2  13.5  13.0   Hemoglobin 13.0 - 17.0 g/dL 16.1  09.6  04.5   Hematocrit 39.0 - 52.0 % 30.9  34.3  30.6   Platelets 150 - 400 K/uL 314  369  405        Latest Ref Rng & Units 10/03/2023    4:33 AM 10/02/2023    1:51 PM 05/08/2023    3:39 PM  BMP  Glucose 70 - 99 mg/dL 409  811  914   BUN 8 - 23 mg/dL 9  10  14    Creatinine 0.61 - 1.24 mg/dL 7.82  9.56  2.13   Sodium 135 - 145 mmol/L 138  135  132   Potassium 3.5 - 5.1 mmol/L 4.1  3.9  4.1   Chloride 98 - 111 mmol/L 108  103  99   CO2 22 - 32 mmol/L 24  23  25    Calcium 8.9 - 10.3 mg/dL 9.3  9.4  9.4      Images and other studies:  Imaging: DG  Foot Complete Right Result Date: 10/02/2023 CLINICAL DATA:  infection EXAM: RIGHT FOOT COMPLETE - 3+ VIEW COMPARISON:  CT 05/08/2023 FINDINGS: Progressive cortical loss at the medial and lateral margins of the head of the second metatarsal. Previous amputation across the second MTP joint. Chronic subluxation of the great toe interphalangeal joint. Moderate hallux valgus deformity with DJD at the first MTP joint stable. Calcaneal spur. IMPRESSION: Progressive cortical loss at the head of the second metatarsal, consistent with osteomyelitis. Electronically Signed   By: Corlis Leak M.D.   On: 10/02/2023 16:31      Assessment & Plan:  Alexander Maynard is a 78 y.o. male with a history of   Principal Problem:   Cellulitis of right lower extremity Active Problems:   BPH with obstruction/lower urinary tract symptoms   Atrial fibrillation (HCC)   Polycythemia vera (HCC)   Hypothyroidism  #Cellulitis of the right extremity This patient presented with infection of his right lower extremity (please see physical exam above for image of the foot).CT foot showed findings typical and concerning for osteomyelitis of the second metatarsal head.Currently on IV vancomycin. Getting an MRI to confirm suspicions of osteomyelitis. He has a cardiac pacemaker and wondering if that type is compatible with MRI.Touched based with [facility] and they will need more information from [facility] to decide if this patient can get the MRI done .I anticipate this will be probably tomorrow.  Will continue IV antibiotic overnight and follow up in the AM .   #Afib - Continue home Xarelto 20 mg  #Peripheral Neuropathy Continue on home gabapentin 300 mg  # Hypothyroidism Continue home Synthroid 88 mcg  #BPH with obstruction Will Continue home Flomax 0.4 mg   Level of care: MedSurg Diet: Diet IVF: N/A  VTE: SCDs Start: 10/02/23 1804 Code: DNR limited Surrogate: Wife,Ms Alexander Maynard   Signed: Kathleen Lime, MD 10/03/2023, 6:24 AM

## 2023-10-02 NOTE — Patient Instructions (Signed)
 I would recommend going to the emergency room due to the infection in the foot. I think you need a MRI if able (or at least a CT scan) and blood work due to the infection.

## 2023-10-02 NOTE — ED Notes (Signed)
 Foot rewrapped with gauze wrap and non-adherent pad

## 2023-10-02 NOTE — ED Triage Notes (Signed)
 Pt came in via pOV d/t 4 weeks ago his provider Tx his Rt foot from swelling & since then he has taken ABTs & about 1-2 weeks ago the swelling returned & he was sent here by his Dr for eval/Tx. A/Ox4, denies pain but does endorse having a sore on the bottom of that foot now  .

## 2023-10-03 ENCOUNTER — Observation Stay (HOSPITAL_COMMUNITY)

## 2023-10-03 DIAGNOSIS — L03115 Cellulitis of right lower limb: Secondary | ICD-10-CM

## 2023-10-03 DIAGNOSIS — N401 Enlarged prostate with lower urinary tract symptoms: Secondary | ICD-10-CM | POA: Diagnosis not present

## 2023-10-03 DIAGNOSIS — G629 Polyneuropathy, unspecified: Secondary | ICD-10-CM

## 2023-10-03 DIAGNOSIS — I4891 Unspecified atrial fibrillation: Secondary | ICD-10-CM | POA: Diagnosis not present

## 2023-10-03 DIAGNOSIS — M869 Osteomyelitis, unspecified: Secondary | ICD-10-CM | POA: Diagnosis not present

## 2023-10-03 LAB — BASIC METABOLIC PANEL
Anion gap: 6 (ref 5–15)
BUN: 9 mg/dL (ref 8–23)
CO2: 24 mmol/L (ref 22–32)
Calcium: 9.3 mg/dL (ref 8.9–10.3)
Chloride: 108 mmol/L (ref 98–111)
Creatinine, Ser: 0.86 mg/dL (ref 0.61–1.24)
GFR, Estimated: >60 mL/min (ref >60)
Glucose, Bld: 100 mg/dL — ABNORMAL HIGH (ref 70–99)
Potassium: 4.1 mmol/L (ref 3.5–5.1)
Sodium: 138 mmol/L (ref 135–145)

## 2023-10-03 LAB — LACTIC ACID, PLASMA: Lactic Acid, Venous: 0.9 mmol/L (ref 0.5–1.9)

## 2023-10-03 LAB — TIQ- MISLABELED: Test Ordered On Req: 18881

## 2023-10-03 LAB — CBC
HCT: 30.9 % — ABNORMAL LOW (ref 39.0–52.0)
Hemoglobin: 10.5 g/dL — ABNORMAL LOW (ref 13.0–17.0)
MCH: 35.4 pg — ABNORMAL HIGH (ref 26.0–34.0)
MCHC: 34 g/dL (ref 30.0–36.0)
MCV: 104 fL — ABNORMAL HIGH (ref 80.0–100.0)
Platelets: 314 10*3/uL (ref 150–400)
RBC: 2.97 MIL/uL — ABNORMAL LOW (ref 4.22–5.81)
RDW: 15.2 % (ref 11.5–15.5)
WBC: 11.2 10*3/uL — ABNORMAL HIGH (ref 4.0–10.5)
nRBC: 0 % (ref 0.0–0.2)

## 2023-10-03 LAB — PATHOLOGIST SMEAR REVIEW

## 2023-10-03 MED ORDER — GADOBUTROL 1 MMOL/ML IV SOLN
8.0000 mL | Freq: Once | INTRAVENOUS | Status: AC | PRN
  Administered 2023-10-03: 8 mL via INTRAVENOUS

## 2023-10-03 MED ORDER — ENOXAPARIN SODIUM 80 MG/0.8ML IJ SOSY
80.0000 mg | PREFILLED_SYRINGE | Freq: Two times a day (BID) | INTRAMUSCULAR | Status: AC
Start: 2023-10-03 — End: 2023-10-04
  Administered 2023-10-03 – 2023-10-04 (×3): 80 mg via SUBCUTANEOUS
  Filled 2023-10-03 (×3): qty 0.8

## 2023-10-03 MED ORDER — ENOXAPARIN SODIUM 80 MG/0.8ML IJ SOSY: 80.0000 mg | PREFILLED_SYRINGE | Freq: Two times a day (BID) | INTRAMUSCULAR | Status: DC

## 2023-10-03 NOTE — Plan of Care (Signed)

## 2023-10-03 NOTE — Progress Notes (Signed)
 IMTS Daily Note  Subjective: He feels well - no symptoms. No pain in his foot. No fevers or chills. Tolerated the MRI fine.   Objective:  Vital signs in last 24 hours: Vitals:   10/02/23 2200 10/02/23 2300 10/03/23 0141 10/03/23 0823  BP: 111/65 (!) 112/56 118/77 121/63  Pulse: 66 60 80 75  Resp: (!) 21 19 18 18   Temp:   (!) 97.4 F (36.3 C) 98.4 F (36.9 C)  TempSrc:   Oral Oral  SpO2: 96% 95% 98% 96%  Weight:      Height:       GEN: well appearing, sitting up in bed, NAD PULM: normal work of breathing on room air, not in respiratory distress VASC: 1+ DP pulses bilaterally EXT: R foot with ulceration with surrounding erythema on base of 2nd metatarsal (see picture) without blood or purulent discharge. 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 He is stable.   1.) Likely osteomyelitis - MRI read is pending. Podiatry is following closely. Since he is stable, we will continue to hold antibiotics to increase yield of possible bone biopsy. If he has fevers or getting worse, will add broad spectrum antibiotics 2.) Afib - swap Xarelto for Lovenox since he may have a procedure 3.) Peripheral neuropathy - cont home gabapentin 4) hypothyroidism - home synthroid 5) BPH - home flomax 6) ET & PV - continue home hydroxyurea   Regular diet for now, no procedures planned He's a DNR  Dispo: Anticipated discharge in approximately 3 day(s).   Mercie Eon, MD 10/03/2023, 4:04 PM

## 2023-10-03 NOTE — Progress Notes (Signed)
 PHARMACY - ANTICOAGULATION CONSULT NOTE  Pharmacy Consult for Lovenox Indication: atrial fibrillation and DVT  No Known Allergies  Patient Measurements: Height: 5\' 10"  (177.8 cm) Weight: 83.9 kg (185 lb) IBW/kg (Calculated) : 73 Heparin Dosing Weight:   Vital Signs: Temp: 98.4 F (36.9 C) (03/12 0823) Temp Source: Oral (03/12 0823) BP: 121/63 (03/12 0823) Pulse Rate: 75 (03/12 0823)  Labs: Recent Labs    10/02/23 1351 10/03/23 0433  HGB 11.5* 10.5*  HCT 34.3* 30.9*  PLT 369 314  CREATININE 0.94 0.86    Estimated Creatinine Clearance: 71.9 mL/min (by C-G formula based on SCr of 0.86 mg/dL).   Medical History: Past Medical History:  Diagnosis Date   Enlarged prostate without lower urinary tract symptoms (luts)    Essential thrombocythemia (HCC)    Hyperlipidemia    Hypothyroidism    Male erectile disorder    Other specified polyneuropathies    Polycythemia, secondary    Restless leg syndrome    Venous thrombosis    There is a mention of this in his notes.  He is on chronic anticoagulation.    Medications:  Medications Prior to Admission  Medication Sig Dispense Refill Last Dose/Taking   amoxicillin-clavulanate (AUGMENTIN) 875-125 MG tablet Take 1 tablet by mouth 2 (two) times daily. 20 tablet 0 Taking   aspirin 81 MG EC tablet Take 81 mg by mouth daily.   10/02/2023   ciprofloxacin (CIPRO) 500 MG tablet Take 1 tablet (500 mg total) by mouth 2 (two) times daily. 20 tablet 0 Taking   diclofenac Sodium (VOLTAREN) 1 % GEL Apply 4 g topically 4 (four) times daily as needed (for pain).   Taking As Needed   finasteride (PROSCAR) 5 MG tablet Take 5 mg by mouth daily.   10/02/2023   gabapentin (NEURONTIN) 300 MG capsule Take 300 mg by mouth 2 (two) times daily.   10/02/2023   hydroxyurea (HYDREA) 500 MG capsule Take 1,000 mg by mouth daily. May take with food to minimize GI side effects.   10/02/2023   levothyroxine (SYNTHROID) 125 MCG tablet Take 125 mcg by mouth daily  before breakfast.   10/02/2023   Multiple Vitamin (MULTIVITAMIN WITH MINERALS) TABS tablet Take 1 tablet by mouth daily.   10/02/2023   omeprazole (PRILOSEC) 20 MG capsule Take 20 mg by mouth daily.   10/02/2023   rivaroxaban (XARELTO) 20 MG TABS tablet Take 20 mg by mouth every morning.   10/02/2023 at  7:30 AM   sildenafil (VIAGRA) 100 MG tablet Take 50 mg by mouth daily as needed for erectile dysfunction.   Taking As Needed   tamsulosin (FLOMAX) 0.4 MG CAPS capsule Take 0.4 mg by mouth in the morning and at bedtime.   10/02/2023   Scheduled:   enoxaparin (LOVENOX) injection  80 mg Subcutaneous BID   gabapentin  300 mg Oral BID   hydroxyurea  1,000 mg Oral Daily   levothyroxine  125 mcg Oral QAC breakfast   tamsulosin  0.4 mg Oral QPC supper   Infusions:   Assessment: Pt has been on Xarelto for a hx of DVT and afib. Pending MRI to confirm his foot osteo, we will bridge him with Lovenox.  Goal of Therapy:  Anti-Xa level 0.6-1 units/ml 4hrs after LMWH dose given Monitor platelets by anticoagulation protocol: Yes   Plan:  Lovenox 80mg  SQ BID F/u resume Xarelto  Ulyses Southward, PharmD, [place], AAHIVP, CPP Infectious Disease Pharmacist 10/03/2023 12:04 PM

## 2023-10-03 NOTE — Consult Note (Signed)
 PODIATRY CONSULTATION  NAME Alexander Maynard MRN [id number] DOB [birth year] DOA 10/02/2023   Reason for consult:  Chief Complaint  Patient presents with   Rt foot infection    Attending/Consulting physician: Mercie Eon, MD  History of present illness: 78 y.o. male who was admitted to the hospital on 3/11 from the office of Dr. Ardelle Anton after being seen for right foot ulcerations under the second metatarsal head and first toe with concern for infection.  Medical history significant for A-fib with unprovoked DVT, currently on Xarelto.  Patient states that he had previously been seeing another podiatrist for some time.  He states that he has been dealing with right foot wounds since at least September.  Upon admission he had significant redness and swelling to the foot with purulent drainage noted after debriding right first toe blister in the office.  History of prior second toe amputation due to deformity.  Initial x-rays concerning for chronic appearing osteomyelitis involving the second metatarsal head.  Antibiotics were started in the emergency room.  CT scan performed showing findings of osteomyelitis of the second metatarsal head and possible developing abscess around this area.  MRI was performed today with read pending. Patient denies pain, denies any nausea, vomiting, fever, chills, chest pain, shortness of breath.  Past Medical History:  Diagnosis Date   Enlarged prostate without lower urinary tract symptoms (luts)    Essential thrombocythemia (HCC)    Hyperlipidemia    Hypothyroidism    Male erectile disorder    Other specified polyneuropathies    Polycythemia, secondary    Restless leg syndrome    Venous thrombosis    There is a mention of this in his notes.  He is on chronic anticoagulation.       Latest Ref Rng & Units 10/03/2023    4:33 AM 10/02/2023    1:51 PM 05/08/2023    3:39 PM  CBC  WBC 4.0 - 10.5 K/uL 11.2  13.5  13.0   Hemoglobin 13.0 - 17.0 g/dL 25.9  56.3   87.5   Hematocrit 39.0 - 52.0 % 30.9  34.3  30.6   Platelets 150 - 400 K/uL 314  369  405        Latest Ref Rng & Units 10/03/2023    4:33 AM 10/02/2023    1:51 PM 05/08/2023    3:39 PM  BMP  Glucose 70 - 99 mg/dL 643  329  518   BUN 8 - 23 mg/dL 9  10  14    Creatinine 0.61 - 1.24 mg/dL 8.41  6.60  6.30   Sodium 135 - 145 mmol/L 138  135  132   Potassium 3.5 - 5.1 mmol/L 4.1  3.9  4.1   Chloride 98 - 111 mmol/L 108  103  99   CO2 22 - 32 mmol/L 24  23  25    Calcium 8.9 - 10.3 mg/dL 9.3  9.4  9.4       Physical Exam: Lower Extremity Exam Vasc: R - PT faintly palpable due to edema, DP palpable. Cap refill < 3 sec to digits  L - PT palpable, DP palpable. Cap refill <3 sec to digits  Mild erythema and edema to right forefoot  Derm: R  Wound: Located subsecond metatarsal head, measuring approximately 1.3 x 0.6 x 0.2 cm, probe to bone negative, odor absent, drainage absent  There is also stable appearing wound to the right first toe plantar medial aspect approximately 1.5 m in diameter with fat  layer exposed, no deep probing, tunneling or undermining noted.  No significant drainage.  Fibrogranular base.  L - Normal temp/texture/turgor with no open lesion or clinical signs of infection       MSK:  R -significant bunion deformity with hallux malleus that is reducible.  Prominent second metatarsal head plantar aspect, prior second toe amputation. Compartments soft, non-tender, compressible  L -significant bunion deformity with hammertoe contractures  Neuro: Decreased light touch sensation    ASSESSMENT/PLAN OF CARE 78 y.o. male with PMHx significant for A-fib with unprovoked DVT, medical history as described above with right foot ulcerations concerning for osteomyelitis of second metatarsal head  -Patient seen and evaluated -MRI performed today, awaiting official read for definitive plan. -Wounds do appear stable today and appear to have improved with antibiotics. Did discuss  likely need for 2nd metatarsal head resection in the setting of osteomyelitis, will await MRI results prior to proceeding with surgery. - Continue IV abx broad spectrum pending further culture data - Anticoagulation: Currently on Lovenox - Wound care: Betadine wet-to-dry gauze dressing applied today.  Change daily.  Nursing orders placed - WB status: Heel touch weightbearing right foot - Will continue to follow   Thank you for the consult.  Please contact me directly with any questions or concerns.           Bronwen Betters, DPM [facility]    [address] [place], [place] [address]                Office [phone]  Fax [phone]

## 2023-10-04 DIAGNOSIS — E079 Disorder of thyroid, unspecified: Secondary | ICD-10-CM | POA: Diagnosis not present

## 2023-10-04 DIAGNOSIS — D473 Essential (hemorrhagic) thrombocythemia: Secondary | ICD-10-CM | POA: Diagnosis present

## 2023-10-04 DIAGNOSIS — N138 Other obstructive and reflux uropathy: Secondary | ICD-10-CM | POA: Diagnosis present

## 2023-10-04 DIAGNOSIS — Z7902 Long term (current) use of antithrombotics/antiplatelets: Secondary | ICD-10-CM | POA: Diagnosis not present

## 2023-10-04 DIAGNOSIS — G629 Polyneuropathy, unspecified: Secondary | ICD-10-CM | POA: Diagnosis not present

## 2023-10-04 DIAGNOSIS — I480 Paroxysmal atrial fibrillation: Secondary | ICD-10-CM | POA: Diagnosis not present

## 2023-10-04 DIAGNOSIS — L02611 Cutaneous abscess of right foot: Secondary | ICD-10-CM | POA: Diagnosis present

## 2023-10-04 DIAGNOSIS — E785 Hyperlipidemia, unspecified: Secondary | ICD-10-CM | POA: Diagnosis present

## 2023-10-04 DIAGNOSIS — D45 Polycythemia vera: Secondary | ICD-10-CM | POA: Diagnosis present

## 2023-10-04 DIAGNOSIS — Z79899 Other long term (current) drug therapy: Secondary | ICD-10-CM | POA: Diagnosis not present

## 2023-10-04 DIAGNOSIS — I4891 Unspecified atrial fibrillation: Secondary | ICD-10-CM | POA: Diagnosis not present

## 2023-10-04 DIAGNOSIS — M2031 Hallux varus (acquired), right foot: Secondary | ICD-10-CM | POA: Diagnosis present

## 2023-10-04 DIAGNOSIS — Z950 Presence of cardiac pacemaker: Secondary | ICD-10-CM | POA: Diagnosis not present

## 2023-10-04 DIAGNOSIS — Z66 Do not resuscitate: Secondary | ICD-10-CM | POA: Diagnosis present

## 2023-10-04 DIAGNOSIS — L97519 Non-pressure chronic ulcer of other part of right foot with unspecified severity: Secondary | ICD-10-CM | POA: Diagnosis not present

## 2023-10-04 DIAGNOSIS — M869 Osteomyelitis, unspecified: Secondary | ICD-10-CM | POA: Diagnosis not present

## 2023-10-04 DIAGNOSIS — G2581 Restless legs syndrome: Secondary | ICD-10-CM | POA: Diagnosis present

## 2023-10-04 DIAGNOSIS — M2041 Other hammer toe(s) (acquired), right foot: Secondary | ICD-10-CM | POA: Diagnosis present

## 2023-10-04 DIAGNOSIS — Z87891 Personal history of nicotine dependence: Secondary | ICD-10-CM | POA: Diagnosis not present

## 2023-10-04 DIAGNOSIS — Z8249 Family history of ischemic heart disease and other diseases of the circulatory system: Secondary | ICD-10-CM | POA: Diagnosis not present

## 2023-10-04 DIAGNOSIS — D704 Cyclic neutropenia: Secondary | ICD-10-CM | POA: Diagnosis not present

## 2023-10-04 DIAGNOSIS — Z89411 Acquired absence of right great toe: Secondary | ICD-10-CM | POA: Diagnosis not present

## 2023-10-04 DIAGNOSIS — Z7982 Long term (current) use of aspirin: Secondary | ICD-10-CM | POA: Diagnosis not present

## 2023-10-04 DIAGNOSIS — E039 Hypothyroidism, unspecified: Secondary | ICD-10-CM | POA: Diagnosis not present

## 2023-10-04 DIAGNOSIS — Z79890 Hormone replacement therapy (postmenopausal): Secondary | ICD-10-CM | POA: Diagnosis not present

## 2023-10-04 DIAGNOSIS — L03115 Cellulitis of right lower limb: Secondary | ICD-10-CM | POA: Diagnosis present

## 2023-10-04 DIAGNOSIS — I10 Essential (primary) hypertension: Secondary | ICD-10-CM | POA: Diagnosis not present

## 2023-10-04 DIAGNOSIS — K219 Gastro-esophageal reflux disease without esophagitis: Secondary | ICD-10-CM | POA: Diagnosis not present

## 2023-10-04 DIAGNOSIS — Z4801 Encounter for change or removal of surgical wound dressing: Secondary | ICD-10-CM | POA: Diagnosis not present

## 2023-10-04 DIAGNOSIS — N401 Enlarged prostate with lower urinary tract symptoms: Secondary | ICD-10-CM | POA: Diagnosis present

## 2023-10-04 DIAGNOSIS — I4821 Permanent atrial fibrillation: Secondary | ICD-10-CM | POA: Diagnosis not present

## 2023-10-04 DIAGNOSIS — Z7901 Long term (current) use of anticoagulants: Secondary | ICD-10-CM | POA: Diagnosis not present

## 2023-10-04 DIAGNOSIS — M21611 Bunion of right foot: Secondary | ICD-10-CM | POA: Diagnosis present

## 2023-10-04 DIAGNOSIS — Z4781 Encounter for orthopedic aftercare following surgical amputation: Secondary | ICD-10-CM | POA: Diagnosis not present

## 2023-10-04 DIAGNOSIS — M86171 Other acute osteomyelitis, right ankle and foot: Secondary | ICD-10-CM | POA: Diagnosis not present

## 2023-10-04 LAB — BASIC METABOLIC PANEL
Anion gap: 8 (ref 5–15)
BUN: 9 mg/dL (ref 8–23)
CO2: 25 mmol/L (ref 22–32)
Calcium: 9.2 mg/dL (ref 8.9–10.3)
Chloride: 105 mmol/L (ref 98–111)
Creatinine, Ser: 0.92 mg/dL (ref 0.61–1.24)
GFR, Estimated: >60 mL/min (ref >60)
Glucose, Bld: 95 mg/dL (ref 70–99)
Potassium: 3.9 mmol/L (ref 3.5–5.1)
Sodium: 138 mmol/L (ref 135–145)

## 2023-10-04 LAB — CBC
HCT: 30.9 % — ABNORMAL LOW (ref 39.0–52.0)
Hemoglobin: 10.5 g/dL — ABNORMAL LOW (ref 13.0–17.0)
MCH: 35.6 pg — ABNORMAL HIGH (ref 26.0–34.0)
MCHC: 34 g/dL (ref 30.0–36.0)
MCV: 104.7 fL — ABNORMAL HIGH (ref 80.0–100.0)
Platelets: 339 10*3/uL (ref 150–400)
RBC: 2.95 MIL/uL — ABNORMAL LOW (ref 4.22–5.81)
RDW: 15 % (ref 11.5–15.5)
WBC: 10.6 10*3/uL — ABNORMAL HIGH (ref 4.0–10.5)
nRBC: 0 % (ref 0.0–0.2)

## 2023-10-04 LAB — SEDIMENTATION RATE: Sed Rate: 20 mm/h — ABNORMAL HIGH (ref 0–16)

## 2023-10-04 LAB — C-REACTIVE PROTEIN: CRP: 0.6 mg/dL (ref <1.0)

## 2023-10-04 NOTE — Care Management Obs Status (Signed)
 MEDICARE OBSERVATION STATUS NOTIFICATION   Patient Details  Name: OSBALDO MARK MRN: [id number] Date of Birth: 1945-12-30   Medicare Observation Status Notification Given:  Yes    Sherilyn Banker 10/04/2023, 10:13 AM

## 2023-10-04 NOTE — Progress Notes (Signed)
 HD#0 SUBJECTIVE:  Patient Summary: Alexander Maynard is a 78 y.o. 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   Overnight Events: MRI of the foot showed abscess plantar to the second metatarsal head measuring up to approximately 8 x 17 x 14 mm, consistent with acute osteomyelitis of the second metatarsal head.  Interim History: Patient seen at bedside today.  He was laying in bed in no acute distress and was happy to see the team.  He did not report any concerns overnight, says he is doing well and would appreciate if we update his wife regarding his hospital course.  He had already been updated by podiatry regarding the plan for his osteomyelitis and is awaiting surgery possibly tomorrow afternoon.  OBJECTIVE:  Vital Signs: Vitals:   10/03/23 0141 10/03/23 0823 10/03/23 1635 10/03/23 2015  BP: 118/77 121/63 114/62 (!) 113/51  Pulse: 80 75 66 73  Resp: 18 18  19   Temp: (!) 97.4 F (36.3 C) 98.4 F (36.9 C) 97.8 F (36.6 C) 98.4 F (36.9 C)  TempSrc: Oral Oral Oral Oral  SpO2: 98% 96% 96% 98%  Weight:      Height:       Supplemental O2: Room Air SpO2: 98 %  Filed Weights   10/02/23 1323  Weight: 83.9 kg     Intake/Output Summary (Last 24 hours) at 10/04/2023 0551 Last data filed at 10/03/2023 2130 Gross per 24 hour  Intake --  Output 350 ml  Net -350 ml   Net IO Since Admission: -380 mL [10/04/23 0551]  Physical Exam:  Physical Exam Constitutional:      General: He is not in acute distress.    Appearance: Normal appearance. He is not ill-appearing, toxic-appearing or diaphoretic.  Cardiovascular:     Rate and Rhythm: Normal rate and regular rhythm.  Pulmonary:     Effort: Pulmonary effort is normal.  Musculoskeletal:     Comments: Right foot wrapped in bandage.  Did not appreciate any swelling.  Bandage is clean dry and intact.   Skin:    General: Skin is warm and dry.  Neurological:     General: No focal deficit present.     Mental Status: He is alert and oriented to person, place, and time.     Patient Lines/Drains/Airways Status     Active Line/Drains/Airways     Name Placement date Placement time Site Days   Peripheral IV 10/02/23 20 G Anterior;Distal;Left;Upper Arm 10/02/23  1642  Arm  2   Peripheral IV 10/02/23 20 G 1" Anterior;Distal;Left Forearm 10/02/23  1730  Forearm  2   Wound / Incision (Open or Dehisced) 10/03/23 Foot Anterior;Right 10/03/23  0200  Foot  1             ASSESSMENT/PLAN:  Assessment: Principal Problem:   Cellulitis of right lower extremity Active Problems:   BPH with obstruction/lower urinary tract symptoms   Atrial fibrillation (HCC)   Polycythemia vera (HCC)   Hypothyroidism   Osteomyelitis of right foot (HCC)   Plan: # Concerns for osteomyelitis MRI of the right foot consistent with osteomyelitis.  Podiatry confirmed surgery possible tomorrow afternoon.  Will continue to hold antibiotics for now.  Stable and showed no signs of systemic infections.  Podiatry assistance greatly appreciated  # A-fib Will continue Lovenox now that surgery is confirmed for tomorrow afternoon.  # Peripheral neuropathy -Continue  home gabapentin  # Hypothyroidism -Home Synthroid  # BPH with obstruction -Home Flomax   Best Practice: Diet: Regular diet IVF: None Rate:  None VTE: SCDs Start: 10/02/23 1804 Code: Full AB: Held Therapy Recs: None, DME: none Family Contact: Wife, called and notified. DISPO: Anticipated discharge tomorrow to  Home  pending  infectious workup .  Signature: Kathleen Lime , MD Internal Medicine Resident, PGY-1 [facility]  Pager: [phone] 5:51 AM, 10/04/2023   Please contact the on call pager after 5 pm and on weekends at [phone].

## 2023-10-04 NOTE — Progress Notes (Signed)
 PODIATRY PROGRESS NOTE  NAME Alexander Maynard MRN [id number] DOB Oct 19, 1945 DOA 10/02/2023   Reason for consult:  Chief Complaint  Patient presents with   Rt foot infection     History of present illness: 78 y.o. male followed by podiatry for right foot infection.  Patient seen resting comfortably at bedside.  Pain well-controlled.  Dressing clean dry and intact.  MRI yesterday demonstrating second metatarsal head osteomyelitis with associated abscess, osteomyelitis of the distal phalanx right first toe.  Vitals:   10/04/23 0556 10/04/23 0611  BP: 128/71 135/67  Pulse: 75 68  Resp: 20 18  Temp: 97.9 F (36.6 C)   SpO2: 97%        Latest Ref Rng & Units 10/03/2023    4:33 AM 10/02/2023    1:51 PM 05/08/2023    3:39 PM  CBC  WBC 4.0 - 10.5 K/uL 11.2  13.5  13.0   Hemoglobin 13.0 - 17.0 g/dL 69.6  29.5  28.4   Hematocrit 39.0 - 52.0 % 30.9  34.3  30.6   Platelets 150 - 400 K/uL 314  369  405        Latest Ref Rng & Units 10/03/2023    4:33 AM 10/02/2023    1:51 PM 05/08/2023    3:39 PM  BMP  Glucose 70 - 99 mg/dL 132  440  102   BUN 8 - 23 mg/dL 9  10  14    Creatinine 0.61 - 1.24 mg/dL 7.25  3.66  4.40   Sodium 135 - 145 mmol/L 138  135  132   Potassium 3.5 - 5.1 mmol/L 4.1  3.9  4.1   Chloride 98 - 111 mmol/L 108  103  99   CO2 22 - 32 mmol/L 24  23  25    Calcium 8.9 - 10.3 mg/dL 9.3  9.4  9.4       Physical Exam: General: Patient is AOx3, no acute distress. Well appearing  Dermatology: Right foot dressing left intact.  Examination from previous: R  Wound: Located subsecond metatarsal head, measuring approximately 1.3 x 0.6 x 0.2 cm, probe to bone negative, odor absent, drainage absent   There is also stable appearing wound to the right first toe plantar medial aspect approximately 1.5 m in diameter with fat layer exposed, no deep probing, tunneling or undermining noted.  No significant drainage.  Fibrogranular base.  Vascular: Palpable DP and PT pulses  bilaterally.  Cap refill less than 3 seconds to the digits  Neurological: Protective sensation decreased  Musculoskeletal Exam: Prior right foot second toe amputation.  Severe bunion deformity with hallux malleus.  Hammertoe contractures of the lesser digits.    ASSESSMENT/PLAN OF CARE 78 y.o. male with PMHx significant for A-fib with unprovoked DVT, medical history as described above with right foot ulcerations with underlying osteomyelitis of second metatarsal head and first toe distal phalanx  -Patient seen and evaluated -MRI findings reviewed with patient -Plan for surgical intervention describing second metatarsal head resection, partial amputation of even with successful treatment with IV antibiotics alone, he would likely have recurrence due to his foot deformity.  Potential risks include pain, bleeding, continued infection, deformity, need for further procedures, recurrence, loss of limb.  Patient expressed good understanding and is agreeable to proceeding. -Plan to proceed with Dr. Annamary Rummage on 10/05/2023 -N.p.o. starting midnight tonight -Hold morning dose of Lovenox -Obtain consent -Patient will have weightbearing restrictions following surgery.   Please contact me directly with any questions or concerns.  Bronwen Betters, DPM [facility]  Dr. Bronwen Betters, DPM    [address] [place], [place] [address]                Office [phone]  Fax [phone]

## 2023-10-05 ENCOUNTER — Encounter (HOSPITAL_COMMUNITY)
Admission: EM | Disposition: A | Discharge status: Home / Self Care | Payer: Self-pay | Source: Home / Self Care | Attending: Internal Medicine

## 2023-10-05 ENCOUNTER — Encounter (HOSPITAL_COMMUNITY): Payer: Self-pay | Admitting: Internal Medicine

## 2023-10-05 ENCOUNTER — Inpatient Hospital Stay (HOSPITAL_COMMUNITY): Admitting: Anesthesiology

## 2023-10-05 ENCOUNTER — Other Ambulatory Visit: Payer: Self-pay

## 2023-10-05 ENCOUNTER — Inpatient Hospital Stay (HOSPITAL_COMMUNITY)

## 2023-10-05 DIAGNOSIS — I4891 Unspecified atrial fibrillation: Secondary | ICD-10-CM | POA: Diagnosis not present

## 2023-10-05 DIAGNOSIS — M86171 Other acute osteomyelitis, right ankle and foot: Secondary | ICD-10-CM

## 2023-10-05 DIAGNOSIS — E039 Hypothyroidism, unspecified: Secondary | ICD-10-CM | POA: Diagnosis not present

## 2023-10-05 DIAGNOSIS — M869 Osteomyelitis, unspecified: Secondary | ICD-10-CM

## 2023-10-05 HISTORY — PX: AMPUTATION TOE: SHX6595

## 2023-10-05 HISTORY — PX: METATARSAL HEAD EXCISION: SHX5027

## 2023-10-05 SURGERY — EXCISION, METATARSAL BONE, HEAD
Anesthesia: Monitor Anesthesia Care | Site: Toe | Laterality: Right

## 2023-10-05 MED ORDER — PHENYLEPHRINE 80 MCG/ML (10ML) SYRINGE FOR IV PUSH (FOR BLOOD PRESSURE SUPPORT)
PREFILLED_SYRINGE | INTRAVENOUS | Status: AC
  Filled 2023-10-05: qty 30

## 2023-10-05 MED ORDER — PHENYLEPHRINE 80 MCG/ML (10ML) SYRINGE FOR IV PUSH (FOR BLOOD PRESSURE SUPPORT)
PREFILLED_SYRINGE | INTRAVENOUS | Status: DC | PRN
  Administered 2023-10-05 (×2): 160 ug via INTRAVENOUS

## 2023-10-05 MED ORDER — ONDANSETRON HCL 4 MG/2ML IJ SOLN
INTRAMUSCULAR | Status: DC | PRN
  Administered 2023-10-05: 4 mg via INTRAVENOUS

## 2023-10-05 MED ORDER — LIDOCAINE HCL 1 % IJ SOLN
INTRAMUSCULAR | Status: AC
  Filled 2023-10-05: qty 20

## 2023-10-05 MED ORDER — ORAL CARE MOUTH RINSE: 15.0000 mL | Freq: Once | OROMUCOSAL | Status: AC

## 2023-10-05 MED ORDER — CHLORHEXIDINE GLUCONATE 0.12 % MT SOLN
OROMUCOSAL | Status: AC
  Administered 2023-10-05: 15 mL via OROMUCOSAL
  Filled 2023-10-05: qty 15

## 2023-10-05 MED ORDER — ACETAMINOPHEN 500 MG PO TABS: 1000.0000 mg | ORAL_TABLET | Freq: Once | ORAL | Status: AC

## 2023-10-05 MED ORDER — ONDANSETRON HCL 4 MG/2ML IJ SOLN
INTRAMUSCULAR | Status: AC
  Filled 2023-10-05: qty 2

## 2023-10-05 MED ORDER — CEFAZOLIN SODIUM-DEXTROSE 2-3 GM-%(50ML) IV SOLR
INTRAVENOUS | Status: DC | PRN
  Administered 2023-10-05: 2 g via INTRAVENOUS

## 2023-10-05 MED ORDER — OXYCODONE HCL 5 MG/5ML PO SOLN: 5.0000 mg | Freq: Once | ORAL | Status: DC | PRN

## 2023-10-05 MED ORDER — BUPIVACAINE HCL (PF) 0.5 % IJ SOLN
INTRAMUSCULAR | Status: AC
  Filled 2023-10-05: qty 10

## 2023-10-05 MED ORDER — 0.9 % SODIUM CHLORIDE (POUR BTL) OPTIME
TOPICAL | Status: DC | PRN
Start: 2023-10-05 — End: 2023-10-05
  Administered 2023-10-05: 1000 mL

## 2023-10-05 MED ORDER — ONDANSETRON HCL 4 MG/2ML IJ SOLN: 4.0000 mg | Freq: Once | INTRAMUSCULAR | Status: DC | PRN

## 2023-10-05 MED ORDER — SODIUM CHLORIDE (PF) 0.9 % IJ SOLN
INTRAMUSCULAR | Status: AC
  Filled 2023-10-05: qty 10

## 2023-10-05 MED ORDER — VANCOMYCIN HCL 1000 MG IV SOLR
INTRAVENOUS | Status: DC | PRN
  Administered 2023-10-05: 1000 mg via TOPICAL

## 2023-10-05 MED ORDER — ENOXAPARIN SODIUM 80 MG/0.8ML IJ SOSY
80.0000 mg | PREFILLED_SYRINGE | Freq: Two times a day (BID) | INTRAMUSCULAR | Status: DC
  Administered 2023-10-06: 80 mg via SUBCUTANEOUS
  Filled 2023-10-05: qty 0.8

## 2023-10-05 MED ORDER — CHLORHEXIDINE GLUCONATE 0.12 % MT SOLN: 15.0000 mL | Freq: Once | OROMUCOSAL | Status: AC

## 2023-10-05 MED ORDER — PROPOFOL 10 MG/ML IV BOLUS
INTRAVENOUS | Status: DC | PRN
  Administered 2023-10-05: 20 mg via INTRAVENOUS

## 2023-10-05 MED ORDER — BUPIVACAINE HCL (PF) 0.5 % IJ SOLN
INTRAMUSCULAR | Status: DC | PRN
  Administered 2023-10-05: 5 mL

## 2023-10-05 MED ORDER — OXYCODONE HCL 5 MG PO TABS: 5.0000 mg | ORAL_TABLET | Freq: Once | ORAL | Status: DC | PRN

## 2023-10-05 MED ORDER — LACTATED RINGERS IV SOLN: INTRAVENOUS | Status: DC

## 2023-10-05 MED ORDER — CEFAZOLIN SODIUM 1 G IJ SOLR
INTRAMUSCULAR | Status: AC
  Filled 2023-10-05: qty 20

## 2023-10-05 MED ORDER — FENTANYL CITRATE (PF) 100 MCG/2ML IJ SOLN: 25.0000 ug | INTRAMUSCULAR | Status: DC | PRN

## 2023-10-05 MED ORDER — LINEZOLID 600 MG PO TABS
600.0000 mg | ORAL_TABLET | Freq: Two times a day (BID) | ORAL | Status: DC
  Administered 2023-10-05 – 2023-10-06 (×3): 600 mg via ORAL
  Filled 2023-10-05 (×3): qty 1

## 2023-10-05 MED ORDER — LIDOCAINE HCL (PF) 1 % IJ SOLN
INTRAMUSCULAR | Status: DC | PRN
  Administered 2023-10-05: 5 mL

## 2023-10-05 MED ORDER — PROPOFOL 1000 MG/100ML IV EMUL
INTRAVENOUS | Status: AC
  Filled 2023-10-05: qty 200

## 2023-10-05 MED ORDER — AMOXICILLIN-POT CLAVULANATE 875-125 MG PO TABS: 1.0000 | ORAL_TABLET | Freq: Two times a day (BID) | ORAL | Status: DC

## 2023-10-05 MED ORDER — ACETAMINOPHEN 500 MG PO TABS
ORAL_TABLET | ORAL | Status: AC
  Administered 2023-10-05: 1000 mg via ORAL
  Filled 2023-10-05: qty 2

## 2023-10-05 MED ORDER — VANCOMYCIN HCL 1000 MG IV SOLR
INTRAVENOUS | Status: AC
  Filled 2023-10-05: qty 20

## 2023-10-05 MED ORDER — LINEZOLID 600 MG PO TABS: 600.0000 mg | ORAL_TABLET | Freq: Two times a day (BID) | ORAL | Status: DC

## 2023-10-05 MED ORDER — PROPOFOL 500 MG/50ML IV EMUL
INTRAVENOUS | Status: DC | PRN
  Administered 2023-10-05: 50 ug/kg/min via INTRAVENOUS

## 2023-10-05 MED ORDER — AMOXICILLIN-POT CLAVULANATE 875-125 MG PO TABS
1.0000 | ORAL_TABLET | Freq: Two times a day (BID) | ORAL | Status: DC
  Administered 2023-10-05 – 2023-10-06 (×3): 1 via ORAL
  Filled 2023-10-05 (×3): qty 1

## 2023-10-05 SURGICAL SUPPLY — 36 items
BLADE LONG MED 31X9 (MISCELLANEOUS) IMPLANT
BLADE OSC/SAGITTAL MD 5.5X18 (BLADE) ×1 IMPLANT
BLADE SURG 10 STRL SS (BLADE) ×1 IMPLANT
BLADE SURG 15 STRL LF DISP TIS (BLADE) ×2 IMPLANT
BNDG COHESIVE 3X5 TAN ST LF (GAUZE/BANDAGES/DRESSINGS) ×1 IMPLANT
BNDG ELASTIC 3INX 5YD STR LF (GAUZE/BANDAGES/DRESSINGS) ×1 IMPLANT
BNDG ELASTIC 4INX 5YD STR LF (GAUZE/BANDAGES/DRESSINGS) IMPLANT
BNDG ESMARK 4X9 LF (GAUZE/BANDAGES/DRESSINGS) ×1 IMPLANT
BNDG GAUZE DERMACEA FLUFF 4 (GAUZE/BANDAGES/DRESSINGS) IMPLANT
CHLORAPREP W/TINT 26 (MISCELLANEOUS) ×1 IMPLANT
DRSG EMULSION OIL 3X3 NADH (GAUZE/BANDAGES/DRESSINGS) IMPLANT
ELECT REM PT RETURN 9FT ADLT (ELECTROSURGICAL) ×1 IMPLANT
ELECTRODE REM PT RTRN 9FT ADLT (ELECTROSURGICAL) ×1 IMPLANT
GAUZE SPONGE 2X2 STRL 8-PLY (GAUZE/BANDAGES/DRESSINGS) ×2 IMPLANT
GAUZE SPONGE 4X4 12PLY STRL (GAUZE/BANDAGES/DRESSINGS) ×1 IMPLANT
GAUZE STRETCH 2X75IN STRL (MISCELLANEOUS) ×1 IMPLANT
GAUZE XEROFORM 1X8 LF (GAUZE/BANDAGES/DRESSINGS) ×1 IMPLANT
GLOVE BIO SURGEON STRL SZ7.5 (GLOVE) ×1 IMPLANT
GLOVE BIOGEL PI IND STRL 7.5 (GLOVE) ×1 IMPLANT
GOWN STRL REUS W/ TWL LRG LVL3 (GOWN DISPOSABLE) ×2 IMPLANT
KIT BASIN OR (CUSTOM PROCEDURE TRAY) ×1 IMPLANT
NDL BIOPSY JAMSHIDI 8X6 (NEEDLE) IMPLANT
NDL HYPO 25X1 1.5 SAFETY (NEEDLE) ×2 IMPLANT
NEEDLE BIOPSY JAMSHIDI 8X6 (NEEDLE) IMPLANT
NEEDLE HYPO 25X1 1.5 SAFETY (NEEDLE) ×2 IMPLANT
PACK ORTHO EXTREMITY (CUSTOM PROCEDURE TRAY) ×1 IMPLANT
PADDING CAST ABS COTTON 4X4 ST (CAST SUPPLIES) ×2 IMPLANT
SPIKE FLUID TRANSFER (MISCELLANEOUS) ×2 IMPLANT
STAPLER VISISTAT 35W (STAPLE) IMPLANT
STOCKINETTE 4X48 STRL (DRAPES) ×1 IMPLANT
SUT ETHILON 3 0 FSLX (SUTURE) ×1 IMPLANT
SUT PROLENE 3 0 PS 2 (SUTURE) IMPLANT
SUT PROLENE 4 0 PS 2 18 (SUTURE) ×2 IMPLANT
SYR CONTROL 10ML LL (SYRINGE) ×2 IMPLANT
UNDERPAD 30X36 HEAVY ABSORB (UNDERPADS AND DIAPERS) ×1 IMPLANT
WATER STERILE IRR 1000ML POUR (IV SOLUTION) ×1 IMPLANT

## 2023-10-05 NOTE — Evaluation (Addendum)
 Physical Therapy Evaluation Patient Details Name: Alexander Maynard MRN: [id number] DOB: 10/15/1945 Today's Date: 10/05/2023  History of Present Illness  Pt is a 78 y.o. male who presented 10/02/23 with R second metatarsal head osteomyelitis with associated abscess and osteomyelitis of the distal phalanx R first toe. S/p amputation of R hallux at mid proximal phalanx level and resection of R 2nd metatarsal head 3/14. PMH: HLD, hypothyroidism, pacemaker, venous thrombosis   Clinical Impression  Pt presents with condition above and deficits mentioned below, see PT Problem List. PTA, he was independent without DME, living with his wife in a 1-level house with a level entrance. His wife has poor vision, but can physically assist him if needed. Currently, pt appears to be mobilizing near his baseline, quickly progressing from utilizing a RW to no AD to ambulate without LOB or physical assistance. Half way through ambulating, pt's R foot began to bleed. Immediately returned pt to the room and had him sit with his leg elevated. The bleeding continued to pool and would not stop for at least ~10 min. Notified RN immediately and notified MD. Another RN assisted in stopping the bleed and redressing the wound. Bleeding appeared to be originating from superior stitch line, but the closure looked good and proximal. RN sent MD a photo of the wound. Pt denied any lightheadedness or issues throughout. The pt will likely progress well and quickly, thus do not feel he will need any follow-up PT upon d/c, but will continue to follow acutely to maximize his return to baseline prior to d/c home.        If plan is discharge home, recommend the following: Assistance with cooking/housework;Help with stairs or ramp for entrance   Can travel by private vehicle        Equipment Recommendations None recommended by PT  Recommendations for Other Services       Functional Status Assessment Patient has had a recent decline in  their functional status and demonstrates the ability to make significant improvements in function in a reasonable and predictable amount of time.     Precautions / Restrictions Precautions Precautions: Fall Required Braces or Orthoses: Other Brace (post-op shoe) Other Brace: post-op shoe Restrictions Weight Bearing Restrictions Per Provider Order: Yes RLE Weight Bearing Per Provider Order: Weight bearing as tolerated Other Position/Activity Restrictions: in post-op shoe      Mobility  Bed Mobility Overal bed mobility: Modified Independent             General bed mobility comments: HOB elevated, slightly increased time, no assistance needed    Transfers Overall transfer level: Needs assistance Equipment used: Rolling walker (2 wheels) Transfers: Sit to/from Stand Sit to Stand: Supervision           General transfer comment: Cues needed to push up from EOB to power up to stand with RW, no LOB, supervision for safety    Ambulation/Gait Ambulation/Gait assistance: Supervision, Contact guard assist Gait Distance (Feet): 70 Feet Assistive device: None, Rolling walker (2 wheels) Gait Pattern/deviations: Step-through pattern, Decreased stance time - right, Decreased stride length, Antalgic Gait velocity: reduced Gait velocity interpretation: 1.31 - 2.62 ft/sec, indicative of limited community ambulator   General Gait Details: Pt noted to ambulate with a slightly antalgic gait pattern with decreased R stance time and excess lateral trunk flexion. He ambulated the first half with the RW for support, but quickly progressed to no UE support, no LOB, CGA-supervision for safety. Distance limited by R foot spontaneously beginning to bleed  after ambulating ~35 ft, immediately turned around and returned to room.  Stairs            Wheelchair Mobility     Tilt Bed    Modified Rankin (Stroke Patients Only)       Balance Overall balance assessment: Needs  assistance Sitting-balance support: No upper extremity supported, Feet supported Sitting balance-Leahy Scale: Normal Sitting balance - Comments: Able to donn shoes sitting EOB without LOB   Standing balance support: Bilateral upper extremity supported, No upper extremity supported, During functional activity Standing balance-Leahy Scale: Good Standing balance comment: Able to ambulate without UE support or LOB                             Pertinent Vitals/Pain Pain Assessment Pain Assessment: Faces Faces Pain Scale: Hurts a little bit Pain Location: R foot Pain Descriptors / Indicators: Operative site guarding Pain Intervention(s): Limited activity within patient's tolerance, Monitored during session, Repositioned    Home Living Family/patient expects to be discharged to:: Private residence Living Arrangements: Spouse/significant other Available Help at Discharge: Family;Available 24 hours/day Type of Home: House Home Access: Level entry       Home Layout: One level Home Equipment: None      Prior Function Prior Level of Function : Independent/Modified Independent;Driving             Mobility Comments: No AD, no recent falls in past 6 months       Extremity/Trunk Assessment   Upper Extremity Assessment Upper Extremity Assessment: Defer to OT evaluation    Lower Extremity Assessment Lower Extremity Assessment: RLE deficits/detail RLE Deficits / Details: stitches noted s/p amputation of R hallux at mid proximal phalanx level and resection of R 2nd metatarsal head 3/14; at one point bleeding began and pooled from the superior stitch line while ambulating, eventually got it to stop and rebandaged wound with RN    Cervical / Trunk Assessment Cervical / Trunk Assessment: Normal  Communication   Communication Communication: No apparent difficulties    Cognition Arousal: Alert Behavior During Therapy: WFL for tasks assessed/performed   PT - Cognitive  impairments: No apparent impairments                       PT - Cognition Comments: questionable safety awareness, asking if he could get up shortly after justing getting foot to stop bleeding Following commands: Intact       Cueing Cueing Techniques: Verbal cues     General Comments General comments (skin integrity, edema, etc.): Half way through ambulating, pt's R foot began to bleed. Immediately returned pt to the room and had him sit with his leg elevated. The bleeding continued to pool and would not stop for at least ~10 min. Notified RN immediately and notified MD. Another RN assisted in stopping the bleed and redressing the wound. Bleeding appeared to be originating from superior stitch line, but the closure looked good and proximal. RN sent MD a photo of the wound. Pt denied any lightheadedness or issues throughout.    Exercises     Assessment/Plan    PT Assessment Patient needs continued PT services  PT Problem List Decreased balance;Decreased mobility;Decreased skin integrity;Pain       PT Treatment Interventions DME instruction;Gait training;Functional mobility training;Stair training;Therapeutic activities;Therapeutic exercise;Balance training;Neuromuscular re-education;Patient/family education    PT Goals (Current goals can be found in the Care Plan section)  Acute Rehab PT Goals  Patient Stated Goal: to go home tomorrow PT Goal Formulation: With patient Time For Goal Achievement: 10/19/23 Potential to Achieve Goals: Good Additional Goals Additional Goal #1: Pt will be able to score > or equal to 22/24 on the DGI    Frequency Min 1X/week     Co-evaluation               AM-PAC PT "6 Clicks" Mobility  Outcome Measure Help needed turning from your back to your side while in a flat bed without using bedrails?: None Help needed moving from lying on your back to sitting on the side of a flat bed without using bedrails?: None Help needed moving to and  from a bed to a chair (including a wheelchair)?: A Little Help needed standing up from a chair using your arms (e.g., wheelchair or bedside chair)?: A Little Help needed to walk in hospital room?: A Little Help needed climbing 3-5 steps with a railing? : A Little 6 Click Score: 20    End of Session Equipment Utilized During Treatment: Gait belt Activity Tolerance: Patient tolerated treatment well;Other (comment) (limited by bleeding from incision site) Patient left: in chair;with call bell/phone within reach;with chair alarm set Nurse Communication: Other (comment) (of bleeding from incision site - notified MD and other RN nearby as well, who assisted) PT Visit Diagnosis: Unsteadiness on feet (R26.81);Other abnormalities of gait and mobility (R26.89);Difficulty in walking, not elsewhere classified (R26.2);Pain Pain - Right/Left: Right Pain - part of body: Ankle and joints of foot    Time: 1654-1736 PT Time Calculation (min) (ACUTE ONLY): 42 min   Charges:   PT Evaluation $PT Eval Low Complexity: 1 Low PT Treatments $Gait Training: 8-22 mins $Therapeutic Activity: 8-22 mins PT General Charges $$ ACUTE PT VISIT: 1 Visit         Virgil Benedict, PT, DPT Acute Rehabilitation Services  Office: [phone]   Bettina Gavia 10/05/2023, 6:03 PM

## 2023-10-05 NOTE — Transfer of Care (Signed)
 Immediate Anesthesia Transfer of Care Note  Patient: Alexander Maynard  Procedure(s) Performed: EXCISION, METATARSAL BONE, HEAD, 2nd (Right: Toe) AMPUTATION, TOE, 1st toe partial (Right: Toe)  Patient Location: PACU  Anesthesia Type:MAC  Level of Consciousness: awake, alert , and oriented  Airway & Oxygen Therapy: Patient Spontanous Breathing  Post-op Assessment: Report given to RN and Post -op Vital signs reviewed and stable  Post vital signs: Reviewed and stable  Last Vitals:  Vitals Value Taken Time  BP 92/54 10/05/23 1400  Temp 36.6 C 10/05/23 1400  Pulse 67 10/05/23 1403  Resp 18 10/05/23 1403  SpO2 96 % 10/05/23 1403  Vitals shown include unfiled device data.  Last Pain:  Vitals:   10/05/23 1303  TempSrc:   PainSc: 3          Complications: No notable events documented.

## 2023-10-05 NOTE — Hospital Course (Addendum)
#  Osteomyelitis Alexander Maynard presented from his podiatrist office due to concerns for an infected and swollen foot with pusy drainage.  He had already seen his primary care physician and was already started on Augmentin and ciprofloxacin for 10 days and scheduled to see a podiatrist. Prior to coming to the ED , he saw the podiatrist who did a CT scan  of the right foot that was suspicious for osteomyelitis.  A follow up MRI of the right foot that showed  an abscess plantar to the second metatarsal and acute osteomyelitis of the second metatarsal head on MRI, and had  excision of the metatarsal head bone and partial  amputation of the 1st toe. He was then initiated on 5 days of Augmentin and linezolid PO. On POD1,he worked with physical therapy was did well. He agrees with the plan to discharge to continue oral antibiotics at home .  #A-fib  Patient wasn't in Afib on arrival. His home Xarelto was resumed and later switched to Lovenox in anticipation for surgery.His Lovenox was resumed 24 hrs post op and discharged on his home Xarelto.

## 2023-10-05 NOTE — Progress Notes (Signed)
 PT Cancellation Note  Patient Details Name: Alexander Maynard MRN: [id number] DOB: [birth year]   Cancelled Treatment:    Reason Eval/Treat Not Completed: (P) Other (comment). Pt planned for R ray amputation this AM. Will plan to follow-up after procedure this afternoon as able.   Virgil Benedict, PT, DPT Acute Rehabilitation Services  Office: [phone]    Bettina Gavia 10/05/2023, 11:16 AM

## 2023-10-05 NOTE — Anesthesia Postprocedure Evaluation (Signed)
 Anesthesia Post Note  Patient: Alexander Maynard  Procedure(s) Performed: EXCISION, METATARSAL BONE, HEAD, 2nd (Right: Toe) AMPUTATION, TOE, 1st toe partial (Right: Toe)     Patient location during evaluation: PACU Anesthesia Type: MAC Level of consciousness: awake and alert Pain management: pain level controlled Vital Signs Assessment: post-procedure vital signs reviewed and stable Respiratory status: spontaneous breathing, nonlabored ventilation and respiratory function stable Cardiovascular status: stable and blood pressure returned to baseline Anesthetic complications: no   No notable events documented.  Last Vitals:  Vitals:   10/05/23 1415 10/05/23 1540  BP: (!) 100/55 112/67  Pulse: 64 65  Resp: (!) 21 17  Temp: 36.6 C 36.4 C  SpO2: 94% 97%    Last Pain:  Vitals:   10/05/23 1540  TempSrc: Oral  PainSc:                  Beryle Lathe

## 2023-10-05 NOTE — Progress Notes (Signed)
 Pt is s/p toe amputation. Podiatry recommended 5 days of Augmentin 875mg  PO BID and Linezolid 600mg  PO BID.  Ok to resume Lovenox 80mg  SQ BID in AM before going back to Xarelto.   Ulyses Southward, PharmD, BCIDP, AAHIVP, CPP Infectious Disease Pharmacist 10/05/2023 2:35 PM

## 2023-10-05 NOTE — Anesthesia Procedure Notes (Signed)
 Procedure Name: MAC Date/Time: 10/05/2023 1:14 PM  Performed by: Caryn Bee A, CRNAPre-anesthesia Checklist: Patient identified, Emergency Drugs available, Suction available and Patient being monitored Patient Re-evaluated:Patient Re-evaluated prior to induction Oxygen Delivery Method: Simple face mask Preoxygenation: Pre-oxygenation with 100% oxygen Placement Confirmation: positive ETCO2 Dental Injury: Teeth and Oropharynx as per pre-operative assessment

## 2023-10-05 NOTE — Progress Notes (Signed)
 History and Physical Interval Note:  10/05/2023 12:46 PM  Alexander Maynard  has presented today for surgery, with the diagnosis of osteomeylitis of distal right hallux and 2nd met head.  The various methods of treatment have been discussed with the patient and family. After consideration of risks, benefits and other options for treatment, the patient has consented to   Procedure(s): EXCISION, METATARSAL BONE, HEAD, 2nd (Right) AMPUTATION, TOE, 1st toe partial (Right) as a surgical intervention.  The patient's history has been reviewed, patient examined, no change in status, stable for surgery.  I have reviewed the patient's chart and labs.  Questions were answered to the patient's satisfaction.     Jenelle Mages Shaletha Humble

## 2023-10-05 NOTE — Anesthesia Preprocedure Evaluation (Addendum)
 Anesthesia Evaluation  Patient identified by MRN, date of birth, ID band Patient awake    Reviewed: Allergy & Precautions, NPO status , Patient's Chart, lab work & pertinent test results  History of Anesthesia Complications Negative for: history of anesthetic complications  Airway Mallampati: II  TM Distance: >3 FB Neck ROM: Full    Dental  (+) Edentulous Upper, Edentulous Lower   Pulmonary former smoker   Pulmonary exam normal        Cardiovascular Normal cardiovascular exam+ dysrhythmias Atrial Fibrillation + pacemaker      Neuro/Psych negative neurological ROS  negative psych ROS   GI/Hepatic Neg liver ROS,GERD  Medicated and Controlled,,  Endo/Other  Hypothyroidism    Renal/GU negative Renal ROS     Musculoskeletal negative musculoskeletal ROS (+)    Abdominal   Peds  Hematology  (+) Blood dyscrasia, anemia  On xarelto Essential thrombocythemia    Anesthesia Other Findings   Reproductive/Obstetrics                             Anesthesia Physical Anesthesia Plan  ASA: 3  Anesthesia Plan: MAC   Post-op Pain Management:    Induction:   PONV Risk Score and Plan: 1 and Propofol infusion and Treatment may vary due to age or medical condition  Airway Management Planned: Natural Airway and Simple Face Mask  Additional Equipment: None  Intra-op Plan:   Post-operative Plan:   Informed Consent: I have reviewed the patients History and Physical, chart, labs and discussed the procedure including the risks, benefits and alternatives for the proposed anesthesia with the patient or authorized representative who has indicated his/her understanding and acceptance.   Patient has DNR.  Discussed DNR with patient and Suspend DNR.     Plan Discussed with: CRNA and Anesthesiologist  Anesthesia Plan Comments:        Anesthesia Quick Evaluation

## 2023-10-05 NOTE — Progress Notes (Signed)
 HD#1 SUBJECTIVE:  Patient Summary: Alexander Maynard is a 78 y.o. 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  Overnight Events: Scheduled for surgery on  10/05/2023 @ 11:30 am    Interim History: Patient was sitting in chair . He had no concerns overnight and [place] ready for his surgery. He wondered how  long the surgery will take. He was reassured Podiatry will answer all questions regarding his surgery prior being taking to the OR.   OBJECTIVE:  Vital Signs: Vitals:   10/04/23 0909 10/04/23 1705 10/04/23 2042 10/05/23 0405  BP: 115/70 115/72 122/61 114/66  Pulse: 63 68 65 66  Resp:  18 19 16   Temp: 98.2 F (36.8 C) 98.2 F (36.8 C) 98.2 F (36.8 C) 98.5 F (36.9 C)  TempSrc: Oral Oral Oral Oral  SpO2: 96% 97% 96% 92%  Weight:      Height:       Supplemental O2: Room Air SpO2: 92 %  Filed Weights   10/02/23 1323  Weight: 83.9 kg    No intake or output data in the 24 hours ending 10/05/23 0827  Net IO Since Admission: -380 mL [10/05/23 0827]  Physical Exam:  Physical Exam Constitutional:      General: He is not in acute distress.    Appearance: Normal appearance. He is not ill-appearing, toxic-appearing or diaphoretic.  Cardiovascular:     Rate and Rhythm: Normal rate and regular rhythm.  Pulmonary:     Effort: Pulmonary effort is normal.  Musculoskeletal:     Comments: Right foot wrapped in bandage.  Did not appreciate any swelling.  Bandage is clean dry and intact.  Skin:    General: Skin is warm and dry.  Neurological:     General: No focal deficit present.     Mental Status: He is alert and oriented to person, place, and time.    Patient  Lines/Drains/Airways Status     Active Line/Drains/Airways     Name Placement date Placement time Site Days   Peripheral IV 10/02/23 20 G Anterior;Distal;Left;Upper Arm 10/02/23  1642  Arm  2   Peripheral IV 10/02/23 20 G 1" Anterior;Distal;Left Forearm 10/02/23  1730  Forearm  2   Wound / Incision (Open or Dehisced) 10/03/23 Foot Anterior;Right 10/03/23  0200  Foot  1             ASSESSMENT/PLAN:  Assessment: Principal Problem:   Osteomyelitis of right foot (HCC) Active Problems:   BPH with obstruction/lower urinary tract symptoms   Atrial fibrillation (HCC)   Cellulitis of right lower extremity   Polycythemia vera (HCC)   Hypothyroidism   Osteomyelitis (HCC)   Plan: # Concerns for osteomyelitis Pending second metatarsal head resection and  partial amputation today at 11:31 am . Will assess patient post-op  # A-fib Lovenox held for surgery . Will resume anticoagulation after surgery if appropriate   # Peripheral neuropathy -Continue home gabapentin  # Hypothyroidism -Home Synthroid  # BPH with obstruction -Home Flomax   Best Practice: Diet: Regular diet IVF: None Rate:  None VTE: SCDs Start: 10/02/23 1804 Code: Full AB: Held Therapy Recs: None, DME: none Family Contact: Wife, called and notified. DISPO: Anticipated discharge tomorrow  to  Home  pending  infectious workup .  Signature: Kathleen Lime , MD Internal Medicine Resident, PGY-1 [facility]  Pager: [phone] 8:27 AM, 10/05/2023   Please contact the on call pager after 5 pm and on weekends at [phone].

## 2023-10-05 NOTE — Plan of Care (Signed)

## 2023-10-05 NOTE — Progress Notes (Signed)
 Orthopedic Tech Progress Note Patient Details:  Alexander Maynard May 19, 1946 [id number]  Ortho Devices Type of Ortho Device: Postop shoe/boot Ortho Device/Splint Location: RLE/ left with RN Ortho Device/Splint Interventions: Abundio Miu 10/05/2023, 2:28 PM

## 2023-10-05 NOTE — Op Note (Addendum)
 Full Operative Report  Date of Operation: 2:07 PM, 10/05/2023   Patient: Alexander Maynard - 78 y.o. male  Surgeon: Pilar Plate, DPM   Assistant: None  Diagnosis: Osteomyelitis  Procedure:  1. Amputation of hallux at mid proximal phalanx level, right foot 2. 2nd metatarsal head resection, right foot    Anesthesia: Monitor Anesthesia Care  No responsible provider has been recorded for the case.  Anesthesiologist: Beryle Lathe, MD CRNA: Bishop Limbo, CRNA; Caryn Bee A, CRNA   Estimated Blood Loss: Minimal   Hemostasis: 1) Anatomical dissection, mechanical compression, electrocautery 2) No tourniquet was used during the procedure  Implants: * No implants in log *  Materials: 3-0 Prolene  Injectables: 1) Pre-operatively: 10 cc of 50:50 mixture 1%lidocaine plain and 0.5% marcaine plain 2) Post-operatively: None   Specimens: Pathology: Right hallux distal phalanx and portion proximal phalanx for pathology, second metatarsal head with proximal margin inked for pathology. microbiology: Bone culture second metatarsal head   Antibiotics: IV antibiotics 2 gm ancef given at conclusion of procedure following cultures obtained  Drains: None  Complications: Patient tolerated the procedure well without complication.   Operative findings: As below in detailed report  Indications for Procedure: ZACCHAEUS HALM presents to Pilar Plate, DPM with a chief complaint of chronic ulceration subsecond metatarsal head as well as erythema and edema with purulence distal great toe right foot.  the patient has failed conservative treatments of various modalities. At this time the patient has elected to proceed with surgical correction. All alternatives, risks, and complications of the procedures were thoroughly explained to the patient. Patient exhibits appropriate understanding of all discussion points and informed consent was signed and obtained in the chart  with no guarantees to surgical outcome given or implied.  Description of Procedure: Patient was brought to the operating room. Patient remained on their hospital bed in the supine position. A surgical timeout was performed and all members of the operating room, the procedure, and the surgical site were identified. anesthesia occurred as per anesthesia record. Local anesthetic as previously described was then injected about the operative field in a local infiltrative block.  The operative lower extremity as noted above was then prepped and draped in the usual sterile manner. The following procedure then began.  Attention was directed to the first digit on the right foot. A full-thickness incision encompassing the entire digit was made using a #10 blade. Dissection was carried down to bone. The toe was secured with a towel clamp, further dissected in its entirety, and disarticulated at the hallux interphalangeal joint.  This was passed to the back table as a gross specimen. This was then labled and sent to pathology. The bone was noted to be soft and eroded, and consistent with osteomyelitis. All remaining necrotic and devitalized soft tissue structures were visualized and dissected away using sharp and dull dissection.  Further dissection was carried out around the proximal phalanx which was exposed at this point.  A sagittal saw was then used to make a transverse osteotomy at the mid to proximal aspect of the proximal phalanx.  This was resected in entirety and sent with the other specimen for pathology.  Care was taken to protect all neurovascular structures throughout the dissection. All bleeders were cauterized as necessary. The area was then flushed with copious amounts of sterile saline. Then using the suture materials previously described, the site was closed in anatomic layers and the skin was well approximated under minimal tension.  Attention  was then directed to the dorsal aspect of the second  metatarsal head.  10 blade was used to make a longitudinal incision overlying the second metatarsal head and neck area.  Dissection was carried down to the bone level.  Second metatarsal head was exposed and freed of all soft tissue attachments medially and laterally.  No distinct abscess was identified and no purulence was encountered.  There was necrotic and infected tissue present out the second metatarsal head however.  Sagittal saw was then used to make a very proximal transverse osteotomy of the second metatarsal shaft area.  Bone here did feel firm without evidence of necrosis.  The distal aspect the second metatarsal was then resected and sent for pathology -the proximal cut end of the bone was inked for proximal margin analysis.  A bone culture was obtained by rongeur from the second metatarsal head which was soft and necrotic consistent with osteomyelitis.  This was sent for micro.  The surgical cavity was then irrigated thoroughly with sterile saline 1 L.  1 g of vancomycin powder was then implanted within the surgical cavity.  This was then closed over with Prolene suture.  The surgical site was then dressed with Betadine Adaptic 4 x 4 gauze Kerlix Ace wrap. The patient tolerated both the procedure and anesthesia well with vital signs stable throughout. The patient was transferred in good condition and all vital signs stable  from the OR to recovery under the discretion of anesthesia.  Condition: Vital signs stable, neurovascular status unchanged from preoperative   Surgical plan:  Expect clean surgical margin both in the great toe and second metatarsal.  Follow-up culture and pathology result.  Plan for dressing change on Sunday.   The patient will be weightbearing as tolerated in a postop shoe to the operative limb until further instructed. The dressing is to remain clean, dry, and intact. Will continue to follow unless noted elsewhere.   Carlena Hurl, DPM [facility]

## 2023-10-06 ENCOUNTER — Other Ambulatory Visit (HOSPITAL_COMMUNITY): Payer: Self-pay

## 2023-10-06 ENCOUNTER — Encounter (HOSPITAL_COMMUNITY): Payer: Self-pay | Admitting: Podiatry

## 2023-10-06 DIAGNOSIS — Z79899 Other long term (current) drug therapy: Secondary | ICD-10-CM | POA: Diagnosis not present

## 2023-10-06 DIAGNOSIS — I10 Essential (primary) hypertension: Secondary | ICD-10-CM | POA: Diagnosis not present

## 2023-10-06 DIAGNOSIS — Z89411 Acquired absence of right great toe: Secondary | ICD-10-CM | POA: Diagnosis not present

## 2023-10-06 DIAGNOSIS — M86171 Other acute osteomyelitis, right ankle and foot: Secondary | ICD-10-CM | POA: Diagnosis not present

## 2023-10-06 DIAGNOSIS — K219 Gastro-esophageal reflux disease without esophagitis: Secondary | ICD-10-CM | POA: Diagnosis not present

## 2023-10-06 DIAGNOSIS — Z87891 Personal history of nicotine dependence: Secondary | ICD-10-CM | POA: Diagnosis not present

## 2023-10-06 DIAGNOSIS — I4821 Permanent atrial fibrillation: Secondary | ICD-10-CM | POA: Diagnosis not present

## 2023-10-06 DIAGNOSIS — Z7901 Long term (current) use of anticoagulants: Secondary | ICD-10-CM | POA: Diagnosis not present

## 2023-10-06 DIAGNOSIS — E079 Disorder of thyroid, unspecified: Secondary | ICD-10-CM | POA: Diagnosis not present

## 2023-10-06 DIAGNOSIS — Z4781 Encounter for orthopedic aftercare following surgical amputation: Secondary | ICD-10-CM | POA: Diagnosis not present

## 2023-10-06 DIAGNOSIS — Z4801 Encounter for change or removal of surgical wound dressing: Secondary | ICD-10-CM | POA: Diagnosis not present

## 2023-10-06 LAB — CBC
HCT: 29.7 % — ABNORMAL LOW (ref 39.0–52.0)
Hemoglobin: 10.3 g/dL — ABNORMAL LOW (ref 13.0–17.0)
MCH: 36 pg — ABNORMAL HIGH (ref 26.0–34.0)
MCHC: 34.7 g/dL (ref 30.0–36.0)
MCV: 103.8 fL — ABNORMAL HIGH (ref 80.0–100.0)
Platelets: 352 10*3/uL (ref 150–400)
RBC: 2.86 MIL/uL — ABNORMAL LOW (ref 4.22–5.81)
RDW: 14.9 % (ref 11.5–15.5)
WBC: 13.2 10*3/uL — ABNORMAL HIGH (ref 4.0–10.5)
nRBC: 0.2 % (ref 0.0–0.2)

## 2023-10-06 LAB — BASIC METABOLIC PANEL
Anion gap: 10 (ref 5–15)
BUN: 14 mg/dL (ref 8–23)
CO2: 23 mmol/L (ref 22–32)
Calcium: 9.1 mg/dL (ref 8.9–10.3)
Chloride: 103 mmol/L (ref 98–111)
Creatinine, Ser: 1 mg/dL (ref 0.61–1.24)
GFR, Estimated: >60 mL/min (ref >60)
Glucose, Bld: 101 mg/dL — ABNORMAL HIGH (ref 70–99)
Potassium: 4.1 mmol/L (ref 3.5–5.1)
Sodium: 136 mmol/L (ref 135–145)

## 2023-10-06 MED ORDER — LINEZOLID 600 MG PO TABS
600.0000 mg | ORAL_TABLET | Freq: Two times a day (BID) | ORAL | 0 refills | Status: DC
  Filled 2023-10-06: qty 7, 4d supply, fill #0

## 2023-10-06 MED ORDER — AMOXICILLIN-POT CLAVULANATE 875-125 MG PO TABS
1.0000 | ORAL_TABLET | Freq: Two times a day (BID) | ORAL | 0 refills | Status: DC
  Filled 2023-10-06 (×2): qty 7, 4d supply, fill #0

## 2023-10-06 NOTE — Plan of Care (Signed)

## 2023-10-06 NOTE — Evaluation (Signed)
 Occupational Therapy Evaluation Patient Details Name: Alexander Maynard MRN: [id number] DOB: 1945/11/29 Today's Date: 10/06/2023   History of Present Illness   Pt is a 78 y.o. male who presented 10/02/23 with R second metatarsal head osteomyelitis with associated abscess and osteomyelitis of the distal phalanx R first toe. S/p amputation of R hallux at mid proximal phalanx level and resection of R 2nd metatarsal head 3/14. PMH: HLD, hypothyroidism, pacemaker, venous thrombosis     Clinical Impressions Pt ind at baseline with ADL/functional mobility , lives with spouse who can assist at d/c. Pt independently getting self dressed upon arrival, demo's ability to don/doff post op shoe and recalls wear schedule. Pt educated pt on compensatory strategies for showering once cleared by MD. Pt presenting with impairments listed below, however has no further OT needs at this time. Anticipate no OT follow up needs at d/c.     If plan is discharge home, recommend the following:   Assist for transportation;Assistance with cooking/housework     Functional Status Assessment   Patient has had a recent decline in their functional status and demonstrates the ability to make significant improvements in function in a reasonable and predictable amount of time.     Equipment Recommendations   None recommended by OT     Recommendations for Other Services   PT consult     Precautions/Restrictions   Precautions Precautions: Fall Required Braces or Orthoses: Other Brace (R post op shoe) Other Brace: post-op shoe Restrictions Weight Bearing Restrictions Per Provider Order: Yes RLE Weight Bearing Per Provider Order: Weight bearing as tolerated Other Position/Activity Restrictions: in post-op shoe     Mobility Bed Mobility               General bed mobility comments: OOB in chair upon arrival and departure    Transfers Overall transfer level: Needs assistance Equipment used:  None Transfers: Sit to/from Stand Sit to Stand: Supervision                  Balance Overall balance assessment: Needs assistance Sitting-balance support: No upper extremity supported, Feet supported Sitting balance-Leahy Scale: Normal Sitting balance - Comments: Able to donn shoes sitting EOB without LOB   Standing balance support: Bilateral upper extremity supported, No upper extremity supported, During functional activity Standing balance-Leahy Scale: Good                             ADL either performed or assessed with clinical judgement   ADL Overall ADL's : Needs assistance/impaired Eating/Feeding: Independent   Grooming: Independent   Upper Body Bathing: Independent   Lower Body Bathing: Independent   Upper Body Dressing : Independent   Lower Body Dressing: Independent   Toilet Transfer: Independent   Toileting- Clothing Manipulation and Hygiene: Independent       Functional mobility during ADLs: Independent       Vision   Vision Assessment?: No apparent visual deficits     Perception Perception: Not tested       Praxis Praxis: Not tested       Pertinent Vitals/Pain Pain Assessment Pain Assessment: Faces Pain Score: 2  Faces Pain Scale: Hurts a little bit Pain Location: R foot Pain Descriptors / Indicators: Operative site guarding Pain Intervention(s): Limited activity within patient's tolerance     Extremity/Trunk Assessment Upper Extremity Assessment Upper Extremity Assessment: Generalized weakness   Lower Extremity Assessment Lower Extremity Assessment: Defer to PT evaluation  Cervical / Trunk Assessment Cervical / Trunk Assessment: Normal   Communication Communication Communication: No apparent difficulties   Cognition Arousal: Alert Behavior During Therapy: WFL for tasks assessed/performed Cognition: No apparent impairments                               Following commands: Intact        Cueing  General Comments   Cueing Techniques: Verbal cues  VSS   Exercises     Shoulder Instructions      Home Living Family/patient expects to be discharged to:: Private residence Living Arrangements: Spouse/significant other Available Help at Discharge: Family;Available 24 hours/day Type of Home: House Home Access: Level entry     Home Layout: One level     Bathroom Shower/Tub: Walk-in shower;Tub only   Bathroom Toilet: Standard     Home Equipment: Cane - single point          Prior Functioning/Environment Prior Level of Function : Independent/Modified Independent;Driving             Mobility Comments: No AD, no recent falls in past 6 months ADLs Comments: ind    OT Problem List: Impaired balance (sitting and/or standing)   OT Treatment/Interventions:        OT Goals(Current goals can be found in the care plan section)   Acute Rehab OT Goals Patient Stated Goal: none stated OT Goal Formulation: With patient Time For Goal Achievement: 10/20/23 Potential to Achieve Goals: Good ADL Goals Pt Will Perform Upper Body Dressing: Independently;sitting Pt Will Perform Lower Body Dressing: Independently;sitting/lateral leans;sit to/from stand Pt Will Transfer to Toilet: Independently;ambulating;regular height toilet Pt Will Perform Tub/Shower Transfer: Tub transfer;Shower transfer;Independently;ambulating   OT Frequency:       Co-evaluation              AM-PAC OT "6 Clicks" Daily Activity     Outcome Measure Help from another person eating meals?: None Help from another person taking care of personal grooming?: None Help from another person toileting, which includes using toliet, bedpan, or urinal?: None Help from another person bathing (including washing, rinsing, drying)?: None Help from another person to put on and taking off regular upper body clothing?: None Help from another person to put on and taking off regular lower body clothing?:  None 6 Click Score: 24   End of Session Nurse Communication: Mobility status  Activity Tolerance: Patient tolerated treatment well Patient left: with call bell/phone within reach;in chair  OT Visit Diagnosis: Unsteadiness on feet (R26.81);Other abnormalities of gait and mobility (R26.89);Muscle weakness (generalized) (M62.81)                Time: 5784-6962 OT Time Calculation (min): 12 min Charges:  OT General Charges $OT Visit: 1 Visit OT Evaluation $OT Eval Low Complexity: 1 Low  [facility] Malena K, OTD, OTR/L SecureChat Preferred Acute Rehab [phone]   [address]ewain [address] K Koonce 10/06/2023, 11:01 AM

## 2023-10-06 NOTE — Progress Notes (Signed)
   PODIATRY PROGRESS NOTE Patient Name: Alexander Maynard  DOB 06-Oct-1945 DOA 10/02/2023  Hospital Day: 5  Assessment:  78 y.o. 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  Status post partial first toe amputation, second metatarsal head resection on 3/14 with Dr. Annamary Rummage.  AF, VSS  WBC: 13.2 ESR/CRP: 20 / 0.6  Wound/Bone Cultures:Pending  Right foot plain film radiographs post op: IMPRESSION: Transmetatarsal amputation of the second ray. Resection of the first toe at the proximal phalanx.  Plan:  -Surgical dressing change today prior to discharge.  Xeroform, 4 x 4 gauze, ABD, Kerlix, compressive Ace wrap -Patient to keep dressing clean dry and intact -Keep extremity elevated when at rest. -Patient will need outpatient follow-up in the office of Dr. Annamary Rummage early this week for postoperative care -He may weight-bear as tolerated in surgical shoe favoring the heel.  I advised limited weightbearing to protect surgical site and that he hold his next dose of xarelto, we can take regularly following this. -Complete course of oral Augmentin and Linezolid         Barbaraann Share, DPM [facility]    Subjective:  Patient is seen awaiting discharge in lobby. Denies any pain. Does have strikethrough to the dressing.  Objective:   Vitals:   10/06/23 0900 10/06/23 1300  BP: 108/64 112/66  Pulse: 95 78  Resp: 18 18  Temp: 97.8 F (36.6 C) 97.6 F (36.4 C)  SpO2: 97% 97%       Latest Ref Rng & Units 10/06/2023    6:52 AM 10/04/2023    7:14 AM 10/03/2023    4:33 AM  CBC  WBC 4.0 - 10.5 K/uL 13.2  10.6  11.2   Hemoglobin 13.0 - 17.0 g/dL 16.1  09.6  04.5   Hematocrit 39.0 - 52.0 % 29.7  30.9  30.9   Platelets 150 - 400 K/uL 352  339  314        Latest Ref Rng & Units 10/06/2023    6:52 AM 10/04/2023    7:14 AM 10/03/2023    4:33 AM  BMP  Glucose 70 - 99 mg/dL 409   95  811   BUN 8 - 23 mg/dL 14  9  9    Creatinine 0.61 - 1.24 mg/dL 9.14  7.82  9.56   Sodium 135 - 145 mmol/L 136  138  138   Potassium 3.5 - 5.1 mmol/L 4.1  3.9  4.1   Chloride 98 - 111 mmol/L 103  105  108   CO2 22 - 32 mmol/L 23  25  24    Calcium 8.9 - 10.3 mg/dL 9.1  9.2  9.3     General: AAOx3, NAD  Lower Extremity Exam Vasc: Palpable DP and PT pulses.  Foot is warm and well-perfused.  CFT intact to remaining digits.  No posterior calf tenderness.  Right lower extremity edematous.  No erythema  Derm: Dressings are saturated with bloody drainage.  Sutures are intact to surgical incision sites with skin edges approximated. There is some oozing to the incision sites  MSK:  Status post right 1st toe partial amputation, 2nd metatarsal head resection  Neuro: Protective sensation decreased   Radiology:  Results reviewed. See assessment for pertinent imaging results

## 2023-10-06 NOTE — Discharge Summary (Signed)
 Name: Alexander Maynard MRN: [id number] DOB: February 01, 1946 78 y.o. PCP: Richardean Chimera, MD  Date of Admission: 10/02/2023 12:54 PM Date of Discharge: 10/06/2023 Attending Physician: Dr. Cleda Daub  Discharge Diagnosis: Principal Problem:   Osteomyelitis of right foot [facility]) Active Problems:   BPH with obstruction/lower urinary tract symptoms   Atrial fibrillation (HCC)   Cellulitis of right lower extremity   Polycythemia vera (HCC)   Hypothyroidism   Osteomyelitis (HCC)    Discharge Medications: Allergies as of 10/06/2023   No Known Allergies      Medication List     STOP taking these medications    ciprofloxacin 500 MG tablet Commonly known as: Cipro       TAKE these medications    amoxicillin-clavulanate 875-125 MG tablet Commonly known as: AUGMENTIN Take 1 tablet by mouth 2 (two) times daily.   aspirin EC 81 MG tablet Take 81 mg by mouth daily.   diclofenac Sodium 1 % Gel Commonly known as: VOLTAREN Apply 4 g topically 4 (four) times daily as needed (for pain).   finasteride 5 MG tablet Commonly known as: PROSCAR Take 5 mg by mouth daily.   gabapentin 300 MG capsule Commonly known as: NEURONTIN Take 300 mg by mouth 2 (two) times daily.   hydroxyurea 500 MG capsule Commonly known as: HYDREA Take 1,000 mg by mouth daily. May take with food to minimize GI side effects.   levothyroxine 125 MCG tablet Commonly known as: SYNTHROID Take 125 mcg by mouth daily before breakfast.   linezolid 600 MG tablet Commonly known as: ZYVOX Take 1 tablet (600 mg total) by mouth every 12 (twelve) hours.   multivitamin with minerals Tabs tablet Take 1 tablet by mouth daily.   omeprazole 20 MG capsule Commonly known as: PRILOSEC Take 20 mg by mouth daily.   rivaroxaban 20 MG Tabs tablet Commonly known as: XARELTO Take 20 mg by mouth every morning.   sildenafil 100 MG tablet Commonly known as: VIAGRA Take 50 mg by mouth daily as needed for erectile dysfunction.    tamsulosin 0.4 MG Caps capsule Commonly known as: FLOMAX Take 0.4 mg by mouth in the morning and at bedtime.               Discharge Care Instructions  (From admission, onward)           Start     Ordered   10/06/23 0000  Leave dressing on - Keep it clean, dry, and intact until clinic visit        10/06/23 1233            Disposition and follow-up:   Alexander Maynard was discharged from [facility] in Good condition.  At the hospital follow up visit please address:  1.  Follow-up:  a. For his osteomyelitis of right root, please ensure he finished the course of antibiotic and exam the foot for any signs of reinfection.    b. For his Afib, continue home xarelto   2.  Labs / imaging needed at time of follow-up: N/A   3.  Pending labs/ test needing follow-up: Right toe culture  4.  Medication Changes  Abx -  Augmentin and Linezolid  End Date: 10/08/2023  Follow-up Appointments: Podiatry on 10/11/2023  Hospital Course by problem list: #Osteomyelitis Alexander Talarico presented from his podiatrist office due to concerns for an infected and swollen foot with pusy drainage.  He had already seen his primary care physician and was started  on Augmentin and ciprofloxacin for 10 days and scheduled to see a podiatrist. Prior to coming to the ED , he saw the podiatrist who did a CT scan  of the right foot that was suspicious for osteomyelitis.  A follow up MRI of the right foot that showed  an abscess plantar to the second metatarsal and acute osteomyelitis of the second metatarsal head on MRI.He had  excision of the metatarsal head bone and partial  amputation of the 1st toe on 10/05/2023. He was then initiated on 5 days of Augmentin and linezolid PO. On POD1,he worked with physical therapy was did well. He agrees with the plan to discharge to continue oral antibiotics at home .  #A-fib  Patient wasn't in Afib on arrival. His home Xarelto was resumed and later switched  to Lovenox in anticipation for surgery.His Lovenox was resumed 24 hrs post op and discharged on his home Xarelto.   Discharge Subjective: Patient was sitting in bed , in no acute distress. Says he feels great overall and denies any fever , chills or pain at the surgical site .Wanted some coffee to start his day. He otherwise had no concerns and is grateful for the care he has received in the hospital   Discharge Exam:   BP 108/64 (BP Location: Left Arm)   Pulse 95   Temp 97.8 F (36.6 C) (Oral)   Resp 18   Ht 5\' 10"  (1.778 m)   Wt 83 kg   SpO2 97%   BMI 26.26 kg/m  Constitutional: well-appearing man, sitting in bed, in no acute distress HENT: normocephalic atraumatic, mucous membranes moist Eyes: conjunctiva non-erythematous Neck: supple Cardiovascular: regular rate and rhythm, no m/r/g Pulmonary/Chest: normal work of breathing on room air, lungs clear to auscultation bilaterally Abdominal: soft, non-tender, non-distended MSK: Surgical site on right foot looked clean, dry and intact in clean wrap Neurological: alert & oriented x 3, 5/5 strength in bilateral upper and lower extremities, normal gait Skin: warm and dry Psych: normal mood and affect    Pertinent Labs, Studies, and Procedures:     Latest Ref Rng & Units 10/06/2023    6:52 AM 10/04/2023    7:14 AM 10/03/2023    4:33 AM  CBC  WBC 4.0 - 10.5 K/uL 13.2  10.6  11.2   Hemoglobin 13.0 - 17.0 g/dL 13.0  86.5  78.4   Hematocrit 39.0 - 52.0 % 29.7  30.9  30.9   Platelets 150 - 400 K/uL 352  339  314        Latest Ref Rng & Units 10/06/2023    6:52 AM 10/04/2023    7:14 AM 10/03/2023    4:33 AM  CMP  Glucose 70 - 99 mg/dL 696  95  295   BUN 8 - 23 mg/dL 14  9  9    Creatinine 0.61 - 1.24 mg/dL 2.84  1.32  4.40   Sodium 135 - 145 mmol/L 136  138  138   Potassium 3.5 - 5.1 mmol/L 4.1  3.9  4.1   Chloride 98 - 111 mmol/L 103  105  108   CO2 22 - 32 mmol/L 23  25  24    Calcium 8.9 - 10.3 mg/dL 9.1  9.2  9.3     Alexander FOOT  RIGHT W WO CONTRAST Result Date: 10/03/2023 CLINICAL DATA:  78 year old male with history of atrial fibrillation and unprovoked deep venous thrombosis on Xarelto. Osteomyelitis of the right metatarsal. Concern for infected right foot. Currently  on oral antibiotics for foot swelling. EXAM: MRI OF THE RIGHT FOREFOOT WITHOUT AND WITH CONTRAST TECHNIQUE: Multiplanar, multisequence Alexander imaging of the right forefoot was performed before and after the administration of intravenous contrast. CONTRAST:  8mL GADAVIST GADOBUTROL 1 MMOL/ML IV SOLN COMPARISON:  right foot radiographs 10/02/2023, CT right foot 10/02/2023 FINDINGS: Bones/Joint/Cartilage There is moderate erosion of the medial and lateral aspects of the second metatarsal head with marrow edema and enhancement indicating acute osteomyelitis. There is marrow edema throughout the distal phalanx of the great toe with mild cortical erosion of portions of the distal aspect of the distal phalanx (sagittal series 8 images 18 through 21). Ligaments Moderate hallux valgus. Mild-to-moderate great toe metatarsophalangeal and 2nd through fifth interphalangeal joint space narrowing degenerative change. High-grade great toe interphalangeal joint space narrowing with approximately 90 degree lateral angulation of the distal phalanx of the great toe. There is a 12 mm region of subchondral marrow edema/cystic change within the mid transverse, plantar aspect of the great toe metatarsal head at the articulation with the medial great toe metatarsophalangeal joint sesamoid. There is great toe metatarsal head-medial plantar sesamoid greater than lateral plantar sesamoid joint space narrowing (axial series 5, image 24). The medial plantar sesamoid is located at the mid transverse dimension of the metatarsal head and the lateral sesamoid is more lateral, overall with lateral deviation of the sesamoids. There is moderate great toe soft tissue swelling and edema. Within the plantar aspect of  the great toe at the level of the mid aspect of the distal phalanx there is a rim enhancing fluid collection measuring up to approximately 12 x 10 x 9 mm (transverse by long axis of the foot by short axis of the foot, axial series 9, image 35 and sagittal series 11, image 16), likely a small abscess. High-grade third through fifth PIP hammertoe angulation. Muscles and Tendons Diffuse intrinsic foot musculature atrophy, fatty infiltration, edema, and enhancement. Soft tissues Prior amputation of the second toe phalanges. There is mild fluid with peripheral wall enhancement plantar to the second metatarsal head measuring up to approximately 8 x 17 x 14 mm (transverse by long axis of the foot by short axis of the foot, coronal series 6, image 11 and sagittal series 8, image 18). This is suspicious for an abscess, and borders the second metatarsal head cortex. Moderate diffuse subcutaneous fat edema and swelling. IMPRESSION: 1. Abscess plantar to the second metatarsal head measuring up to approximately 8 x 17 x 14 mm. Acute osteomyelitis of the second metatarsal head. 2. Small abscess within the plantar aspect of the great toe at the level of the mid aspect of the distal phalanx measuring up to approximately 12 x 10 x 9 mm. Acute osteomyelitis of the distal phalanx of the great toe. 3. Moderate hallux valgus. High-grade lateral angulation of the distal phalanx of the great toe. Electronically Signed   By: Neita Garnet M.D.   On: 10/03/2023 17:32   DG Foot Complete Right Result Date: 10/03/2023 Please see detailed radiograph report in office note.  DG Chest 1 View Result Date: 10/02/2023 CLINICAL DATA:  Pacemaker placement EXAM: CHEST  1 VIEW COMPARISON:  07/11/2023 FINDINGS: Left subclavian dual lead pacemaker is in place with leads overlying the right atrium and right ventricle. Cardiac size is within normal limits. Scattered peripheral interstitial infiltrates are present, possibly infectious or inflammatory in  the acute setting. No pneumothorax or pleural effusion. No acute bone abnormality. IMPRESSION: 1. Left subclavian dual lead pacemaker in place. No  pneumothorax. 2. Scattered peripheral interstitial infiltrates, possibly infectious or inflammatory in the acute setting. Electronically Signed   By: Helyn Numbers M.D.   On: 10/02/2023 21:26   CT Foot Right Wo Contrast Result Date: 10/02/2023 CLINICAL DATA:  Foot swelling, diabetic, osteomyelitis suspected. EXAM: CT OF THE LOWER RIGHT EXTREMITY WITHOUT AND WITH CONTRAST TECHNIQUE: Multidetector CT imaging of the lower right extremity was performed following the standard protocol before and during bolus administration of intravenous contrast. RADIATION DOSE REDUCTION: This exam was performed according to the departmental dose-optimization program which includes automated exposure control, adjustment of the mA and/or kV according to patient size and/or use of iterative reconstruction technique. CONTRAST:  75mL OMNIPAQUE IOHEXOL 350 MG/ML SOLN COMPARISON:  Foot radiograph earlier today FINDINGS: Bones/Joint/Cartilage Erosions involving the second metatarsal head medial and laterally. Previous resection of the second toe. There is slight cortical irregularity about the distal fifth metatarsal head. Hallux valgus and degenerative change of the first metatarsal phalangeal joint and interphalangeal joint. Intact tarsal bones. Plantar calcaneal spur and Achilles tendon enthesophyte. Ligaments Suboptimally assessed by CT. Muscles and Tendons No intramuscular collection. Fatty atrophy of the intrinsic musculature of the foot. Soft tissues Small peripherally enhancing bilobed fluid collection subjacent to the second metatarsal head. The component subjacent to the metatarsal head measures 11 x 6 mm, series 6, image 125, with extension distal and laterally containing ill-defined fluid, series 6, image 129. There is diffuse soft tissue thickening at the level of the operative bed  related to second toe resection. Diffuse subcutaneous edema throughout the dorsum of the foot as well as dependently at the ankle. IMPRESSION: 1. Findings typical osteomyelitis of the second metatarsal head. 2. Small peripherally enhancing bilobed fluid collection subjacent to the second metatarsal head, suspicious for abscess. 3. Slight cortical irregularity about the distal fifth metatarsal head, may be due to osteopenia, recommend correlation for clinical signs of infection in this region. 4. Diffuse subcutaneous edema throughout the dorsum of the foot as well as dependently at the ankle. 5. Hallux valgus and degenerative change of the first metatarsophalangeal joint and interphalangeal joint. Electronically Signed   By: Narda Rutherford M.D.   On: 10/02/2023 19:34   CT FOOT RIGHT W CONTRAST Result Date: 10/02/2023 CLINICAL DATA:  Foot swelling, diabetic, osteomyelitis suspected. EXAM: CT OF THE LOWER RIGHT EXTREMITY WITHOUT AND WITH CONTRAST TECHNIQUE: Multidetector CT imaging of the lower right extremity was performed following the standard protocol before and during bolus administration of intravenous contrast. RADIATION DOSE REDUCTION: This exam was performed according to the departmental dose-optimization program which includes automated exposure control, adjustment of the mA and/or kV according to patient size and/or use of iterative reconstruction technique. CONTRAST:  75mL OMNIPAQUE IOHEXOL 350 MG/ML SOLN COMPARISON:  Foot radiograph earlier today FINDINGS: Bones/Joint/Cartilage Erosions involving the second metatarsal head medial and laterally. Previous resection of the second toe. There is slight cortical irregularity about the distal fifth metatarsal head. Hallux valgus and degenerative change of the first metatarsal phalangeal joint and interphalangeal joint. Intact tarsal bones. Plantar calcaneal spur and Achilles tendon enthesophyte. Ligaments Suboptimally assessed by CT. Muscles and Tendons No  intramuscular collection. Fatty atrophy of the intrinsic musculature of the foot. Soft tissues Small peripherally enhancing bilobed fluid collection subjacent to the second metatarsal head. The component subjacent to the metatarsal head measures 11 x 6 mm, series 6, image 125, with extension distal and laterally containing ill-defined fluid, series 6, image 129. There is diffuse soft tissue thickening at the level of the operative  bed related to second toe resection. Diffuse subcutaneous edema throughout the dorsum of the foot as well as dependently at the ankle. IMPRESSION: 1. Findings typical osteomyelitis of the second metatarsal head. 2. Small peripherally enhancing bilobed fluid collection subjacent to the second metatarsal head, suspicious for abscess. 3. Slight cortical irregularity about the distal fifth metatarsal head, may be due to osteopenia, recommend correlation for clinical signs of infection in this region. 4. Diffuse subcutaneous edema throughout the dorsum of the foot as well as dependently at the ankle. 5. Hallux valgus and degenerative change of the first metatarsophalangeal joint and interphalangeal joint. Electronically Signed   By: Narda Rutherford M.D.   On: 10/02/2023 19:34   DG Foot Complete Right Result Date: 10/02/2023 CLINICAL DATA:  infection EXAM: RIGHT FOOT COMPLETE - 3+ VIEW COMPARISON:  CT 05/08/2023 FINDINGS: Progressive cortical loss at the medial and lateral margins of the head of the second metatarsal. Previous amputation across the second MTP joint. Chronic subluxation of the great toe interphalangeal joint. Moderate hallux valgus deformity with DJD at the first MTP joint stable. Calcaneal spur. IMPRESSION: Progressive cortical loss at the head of the second metatarsal, consistent with osteomyelitis. Electronically Signed   By: Corlis Leak M.D.   On: 10/02/2023 16:31     Discharge Instructions: Discharge Instructions     Diet general   Complete by: As directed     Discharge instructions   Complete by: As directed    Dear Alexander Maynard,  It was a pleasure taking care of you at [facility]. You were admitted for foot swelling and swollen foot with pusy drainage  and treated for osteomyelitis. We are discharging you home now that you are doing better. Please follow the following instructions.  1) For your osteomyelitis, I am sending you home to continue the oral antibiotics you started in the hospital. Please continue to take the Augmentin 875 mg twice daily and Linezolid  600 mg twice daily until 10/09/2023.Your surgeon also wants you to bear weight on your right foot as tolerated. If you see any drainage from the surgery site, or feel any warmth or pain, please go to the ED. 2) For your A-fib , we switched you to Lovenox in the hospital but has resumed your home Xarelto on discharge 3) PLEASE continue to take all your home medicine as usual.  Take care,  Dr. Kathleen Lime, MD   Increase activity slowly   Complete by: As directed    Leave dressing on - Keep it clean, dry, and intact until clinic visit   Complete by: As directed        Signed: Kathleen Lime, MD Internal Medicine Teaching Service Pager [phone]

## 2023-10-06 NOTE — Discharge Instructions (Signed)
 Dear Alexander Maynard,  It was a pleasure taking care of you at [facility]. You were admitted for foot swelling and swollen foot with pusy drainage  and treated for osteomyelitis. We are discharging you home now that you are doing better. Please follow the following instructions.  1) For your osteomyelitis, I am sending you home to continue the oral antibiotics you started in the hospital. Please continue to take the Augmentin 875 mg twice daily and Linezolid  600 mg twice daily until 10/09/2023.Your surgeon also wants you to bear weight on your right foot as tolerated. If you see any drainage from the surgery site, or feel any warmth or pain, please go to the ED. 2) For your A-fib , we switched you to Lovenox in the hospital but has resumed your home Xarelto on discharge 3) PLEASE continue to take all your home medicine as usual.  Take care,  Dr. Kathleen Lime, MD

## 2023-10-07 LAB — CULTURE, BLOOD (ROUTINE X 2)
Culture: NO GROWTH
Culture: NO GROWTH

## 2023-10-08 LAB — SURGICAL PATHOLOGY

## 2023-10-09 ENCOUNTER — Ambulatory Visit: Payer: No Typology Code available for payment source | Admitting: Cardiology

## 2023-10-09 DIAGNOSIS — K4090 Unilateral inguinal hernia, without obstruction or gangrene, not specified as recurrent: Secondary | ICD-10-CM | POA: Diagnosis not present

## 2023-10-10 LAB — AEROBIC/ANAEROBIC CULTURE W GRAM STAIN (SURGICAL/DEEP WOUND)
Culture: NO GROWTH — AB
Gram Stain: NONE SEEN

## 2023-10-11 ENCOUNTER — Ambulatory Visit (INDEPENDENT_AMBULATORY_CARE_PROVIDER_SITE_OTHER): Admitting: Podiatry

## 2023-10-11 ENCOUNTER — Encounter: Payer: Self-pay | Admitting: Podiatry

## 2023-10-11 ENCOUNTER — Ambulatory Visit: Admitting: Podiatry

## 2023-10-11 VITALS — Ht 70.0 in | Wt 183.0 lb

## 2023-10-11 DIAGNOSIS — M86271 Subacute osteomyelitis, right ankle and foot: Secondary | ICD-10-CM

## 2023-10-11 LAB — PAT ID TIQ DOC: Test Affected: 18881

## 2023-10-11 LAB — WOUND CULTURE
MICRO NUMBER:: 16197169
SPECIMEN QUALITY:: ADEQUATE

## 2023-10-11 MED ORDER — AMOXICILLIN-POT CLAVULANATE 875-125 MG PO TABS: 1.0000 | ORAL_TABLET | Freq: Two times a day (BID) | ORAL | 0 refills | Status: DC

## 2023-10-11 MED ORDER — LINEZOLID 600 MG PO TABS: 600.0000 mg | ORAL_TABLET | Freq: Two times a day (BID) | ORAL | 0 refills | Status: AC

## 2023-10-11 NOTE — Progress Notes (Signed)
  Subjective:  Patient ID: Alexander Maynard, male    DOB: 16-Jul-1946,  MRN: [id number]   DOS: 10/05/2023 Procedure: 1. Amputation of hallux at mid proximal phalanx level, right foot 2. 2nd metatarsal head resection, right foot  78 y.o. male seen for post op check.  Patient reports he has been doing well he did have the dressing changed by his wife once in between leaving the hospital now.  Denies significant drainage or pain.  Does note some redness and swelling in the foot but admits he may have been moving around on it too much.  He has finished his course of antibiotics as instructed  Review of Systems: Negative except as noted in the HPI. Denies N/V/F/Ch.   Objective:  There were no vitals filed for this visit. Body mass index is 26.25 kg/m. Constitutional Well developed. Well nourished.  Vascular Foot warm and well perfused. Capillary refill normal to all digits.   No calf pain with palpation  Neurologic Normal speech. Oriented to person, place, and time. Epicritic sensation absent to forefoot  Dermatologic Mild dehiscence of longitudinal incision above the second metatarsal with hematoma present however no sign of active infection no drainage or malodor hallux amputation site is well coapted with some edema but no dehiscence or drainage   Orthopedic: Status post partial hallux amputation second metatarsal head resection on the right foot   Radiographs: Transmetatarsal amputation of the second ray. Resection of the first toe at the proximal phalanx.  Pathology: A. TOE, RIGHT FIRST METATARSAL, AMPUTATION:  - Skin with ulceration and associated necrotizing inflammation   B. BONE, RIGHT SECOND METATARSAL HEAD, PROXIMAL MARGINS:  - Benign bone and underlying marrow showing trilineage hematopoiesis  - No evidence of acute osteomyelitis   Micro: Multiple organisms none predominant  Assessment:   1. Subacute osteomyelitis of right foot (HCC)    Plan:  Patient was evaluated and  treated and all questions answered.  POD # 6 s/p right foot hallux amputation and second metatarsal head resection -Progressing as expected with some unfortunate dehiscence of the second metatarsal longitudinal incision however no acute infection noted and overall appears superficial with some hematoma -Continue with local wound care and antibiotic therapy -XR: Expected postop changes -WB Status: Minimal weightbearing as tolerated in postop shoe -Sutures: Remain intact. -Medications/ABX: Refill Augmentin and Zyvox for 5 to 7 days -Foot redressed. - Return in 1 week for wound check        Corinna Gab, DPM [facility] / [facility]

## 2023-10-18 DIAGNOSIS — K4090 Unilateral inguinal hernia, without obstruction or gangrene, not specified as recurrent: Secondary | ICD-10-CM | POA: Diagnosis not present

## 2023-10-23 ENCOUNTER — Ambulatory Visit (INDEPENDENT_AMBULATORY_CARE_PROVIDER_SITE_OTHER): Admitting: Podiatry

## 2023-10-23 DIAGNOSIS — L03119 Cellulitis of unspecified part of limb: Secondary | ICD-10-CM

## 2023-10-23 DIAGNOSIS — M86271 Subacute osteomyelitis, right ankle and foot: Secondary | ICD-10-CM

## 2023-10-23 DIAGNOSIS — L02619 Cutaneous abscess of unspecified foot: Secondary | ICD-10-CM

## 2023-10-23 MED ORDER — AMOXICILLIN-POT CLAVULANATE 875-125 MG PO TABS: 1.0000 | ORAL_TABLET | Freq: Two times a day (BID) | ORAL | 0 refills | Status: DC

## 2023-10-23 NOTE — Progress Notes (Signed)
  Subjective:  Patient ID: Alexander Maynard, male    DOB: [birth year],  MRN: [id number]   DOS: 10/05/2023 Procedure: 1. Amputation of hallux at mid proximal phalanx level, right foot 2. 2nd metatarsal head resection, right foot  78 y.o. male seen for post op check. 2.5 weeks s/p above.  Patient denies pain.  He says he does have had some bleeding from second metatarsal resection site.  Had to have been doing dressing change due to some persistent bleeding from the area.  Finished course of antibiotics as previously instructed has been doing some weightbearing in postop shoe  Review of Systems: Negative except as noted in the HPI. Denies N/V/F/Ch.   Objective:  There were no vitals filed for this visit. There is no height or weight on file to calculate BMI. Constitutional Well developed. Well nourished.  Vascular Foot warm and well perfused. Capillary refill normal to all digits.   No calf pain with palpation  Neurologic Normal speech. Oriented to person, place, and time. Epicritic sensation absent to forefoot  Dermatologic Edema noted in the forefoot near the first toe amputation site as well as the second metatarsal resection site.  No significant erythema.  No purulence from the incisions.  Hallux amputation site healing well with eschar overlying.  Or fibrotic tissues noted along the dorsal second metatarsal incision though still coapted    Orthopedic: Status post partial hallux amputation second metatarsal head resection on the right foot   Radiographs: Transmetatarsal amputation of the second ray. Resection of the first toe at the proximal phalanx.  Pathology: A. TOE, RIGHT FIRST METATARSAL, AMPUTATION:  - Skin with ulceration and associated necrotizing inflammation   B. BONE, RIGHT SECOND METATARSAL HEAD, PROXIMAL MARGINS:  - Benign bone and underlying marrow showing trilineage hematopoiesis  - No evidence of acute osteomyelitis   Micro: Multiple organisms none  predominant  Assessment:   1. Subacute osteomyelitis of right foot (HCC)   2. Cellulitis and abscess of foot, except toes     Plan:  Patient was evaluated and treated and all questions answered.   2.5 weeks s/p right foot hallux amputation and second metatarsal head resection -Progressing as expected with some continued fibrotic tissues of the dorsal second metatarsal incision -Concern for possible underlying hematoma that is slowly resolving.  Moderate edema noted about the forefoot. -Continue with local wound care and antibiotic therapy -XR: Expected postop changes -WB Status: Minimal weightbearing as tolerated in postop shoe -Sutures: Remain intact. -Medications/ABX: -Foot redressed. - Return in 1 week for wound check        Corinna Gab, DPM [facility] / [facility]

## 2023-10-30 ENCOUNTER — Encounter: Payer: Self-pay | Admitting: Podiatry

## 2023-10-30 ENCOUNTER — Ambulatory Visit (INDEPENDENT_AMBULATORY_CARE_PROVIDER_SITE_OTHER): Admitting: Podiatry

## 2023-10-30 VITALS — Ht 70.0 in | Wt 183.0 lb

## 2023-10-30 DIAGNOSIS — Z9889 Other specified postprocedural states: Secondary | ICD-10-CM

## 2023-10-30 DIAGNOSIS — L02619 Cutaneous abscess of unspecified foot: Secondary | ICD-10-CM

## 2023-10-30 DIAGNOSIS — L03119 Cellulitis of unspecified part of limb: Secondary | ICD-10-CM

## 2023-10-30 DIAGNOSIS — M86271 Subacute osteomyelitis, right ankle and foot: Secondary | ICD-10-CM

## 2023-10-30 MED ORDER — MUPIROCIN 2 % EX OINT
1.0000 | TOPICAL_OINTMENT | Freq: Every day | CUTANEOUS | 0 refills | Status: DC
Start: 2023-10-30 — End: 2024-03-06

## 2023-10-30 NOTE — Progress Notes (Signed)
  Subjective:  Patient ID: Alexander Maynard, male    DOB: 05/18/1946,  MRN: [id number]   DOS: 10/05/2023 Procedure: 1. Amputation of hallux at mid proximal phalanx level, right foot 2. 2nd metatarsal head resection, right foot  78 y.o. male seen for post op check. 3.5 weeks s/p above.  Patient denies pain.  He reports he is doing well he is left dressing clean dry and intact since last appointment as instructed.  Review of Systems: Negative except as noted in the HPI. Denies N/V/F/Ch.   Objective:  There were no vitals filed for this visit. Body mass index is 26.25 kg/m. Constitutional Well developed. Well nourished.  Vascular Foot warm and well perfused. Capillary refill normal to all digits.   No calf pain with palpation  Neurologic Normal speech. Oriented to person, place, and time. Epicritic sensation absent to forefoot  Dermatologic Decreased edema noted in the forefoot near the first toe amputation site as well as the second metatarsal resection site.  No  erythema.  No purulence from the incisions.  Hallux amputation site fully healed at this time.  Second met head resection site with some fibrotic tissue though appears to have good and fill out of healthy granular tissue in the wound bed as well.  Improved from prior overall   Orthopedic: Status post partial hallux amputation second metatarsal head resection on the right foot   Radiographs: Transmetatarsal amputation of the second ray. Resection of the first toe at the proximal phalanx.  Pathology: A. TOE, RIGHT FIRST METATARSAL, AMPUTATION:  - Skin with ulceration and associated necrotizing inflammation   B. BONE, RIGHT SECOND METATARSAL HEAD, PROXIMAL MARGINS:  - Benign bone and underlying marrow showing trilineage hematopoiesis  - No evidence of acute osteomyelitis   Micro: Multiple organisms none predominant  Assessment:   1. Post-operative state   2. Subacute osteomyelitis of right foot (HCC)   3. Cellulitis and  abscess of foot, except toes      Plan:  Patient was evaluated and treated and all questions answered.  3.5 weeks s/p right foot hallux amputation and second metatarsal head resection -Progressing as expected, improving from prior with complete healing of the hallux amputation site and decreased edema.  Second metatarsal head resection site with improved wound appearance dorsally -Continue with local wound care and antibiotic therapy -will have patient begin every 2 to 3-day dressing change with mupirocin ointment to the dorsal second metatarsal  ulceration -XR: Expected postop changes -WB Status: Minimal weightbearing as tolerated in postop shoe -Sutures: Removed in total at this visit -Medications/ABX: E Rx for mupirocin ointment -Foot redressed with mupirocin ointment and adhesive bandage dressing wrap with Ace wrap. - Return in 2 week for wound check        Corinna Gab, DPM [facility] / [facility]

## 2023-11-13 ENCOUNTER — Encounter: Payer: Self-pay | Admitting: Podiatry

## 2023-11-13 ENCOUNTER — Ambulatory Visit (INDEPENDENT_AMBULATORY_CARE_PROVIDER_SITE_OTHER): Admitting: Podiatry

## 2023-11-13 DIAGNOSIS — L03119 Cellulitis of unspecified part of limb: Secondary | ICD-10-CM

## 2023-11-13 DIAGNOSIS — M86271 Subacute osteomyelitis, right ankle and foot: Secondary | ICD-10-CM

## 2023-11-13 DIAGNOSIS — Z9889 Other specified postprocedural states: Secondary | ICD-10-CM

## 2023-11-13 DIAGNOSIS — L02619 Cutaneous abscess of unspecified foot: Secondary | ICD-10-CM

## 2023-11-13 MED ORDER — MUPIROCIN 2 % EX OINT: 1.0000 | TOPICAL_OINTMENT | Freq: Every day | CUTANEOUS | 0 refills | Status: DC

## 2023-11-13 NOTE — Progress Notes (Signed)
  Subjective:  Patient ID: Alexander Maynard, male    DOB: October 28, 1945,  MRN: [id number]   DOS: 10/05/2023 Procedure: 1. Amputation of hallux at mid proximal phalanx level, right foot 2. 2nd metatarsal head resection, right foot  78 y.o. male seen for post op check. 5 weeks s/p above.  He reports he is doing well he is doing every 2 to 3-day dressing change with mupirocin  ointment and Band-Aid.  Denies  drainage.  No concerns on his behalf he is back to wearing regular shoe  Review of Systems: Negative except as noted in the HPI. Denies N/V/F/Ch.   Objective:  There were no vitals filed for this visit. There is no height or weight on file to calculate BMI. Constitutional Well developed. Well nourished.  Vascular Foot warm and well perfused. Capillary refill normal to all digits.   No calf pain with palpation  Neurologic Normal speech. Oriented to person, place, and time. Epicritic sensation absent to forefoot  Dermatologic The plantar second met head ulceration and the hallux amputation site are fully healed.  Dorsal ulceration overlying second metatarsal head resection with fibrotic tissues present in the wound bed.  Wound does appear to be healing inside the side as well as filling in slowly.  No erythema malodor or evidence of frank infection     Orthopedic: Status post partial hallux amputation second metatarsal head resection on the right foot   Radiographs: Transmetatarsal amputation of the second ray. Resection of the first toe at the proximal phalanx.  Pathology: A. TOE, RIGHT FIRST METATARSAL, AMPUTATION:  - Skin with ulceration and associated necrotizing inflammation   B. BONE, RIGHT SECOND METATARSAL HEAD, PROXIMAL MARGINS:  - Benign bone and underlying marrow showing trilineage hematopoiesis  - No evidence of acute osteomyelitis   Micro: Multiple organisms none predominant  Assessment:   1. Post-operative state   2. Subacute osteomyelitis of right foot (HCC)   3.  Cellulitis and abscess of foot, except toes       Plan:  Patient was evaluated and treated and all questions answered.  5 weeks s/p right foot hallux amputation and second metatarsal head resection - Doing very well with current wound care regimen will continue as prior -Debrided the dorsal second met ulceration with tissue nipper to healthy bleeding base. -Continue every 2 to 3-day dressing change with mupirocin  ointment to the dorsal second metatarsal  ulceration and then cover with adhesive bandage -XR: Expected postop changes -WB Status: Minimal weightbearing as tolerated in diabetic/orthopedic shoe -Medications/ABX: E Rx for mupirocin  ointment -Foot redressed with mupirocin  ointment and adhesive bandage dressing wrap with Ace wrap. - Return in 2 week for wound check        Maridee Shoemaker, DPM [facility] / [facility]

## 2023-11-16 NOTE — Telephone Encounter (Signed)
 Pt made aware he needs a follow up CT from his last year 09/2022 d/t dilated aorta.  Patient aware I will forward this note and previous CT scan to Dr. Mazzella to determine testing follow up need. Patient verbalized understanding and agreeable to plan.

## 2023-11-27 ENCOUNTER — Ambulatory Visit (INDEPENDENT_AMBULATORY_CARE_PROVIDER_SITE_OTHER): Admitting: Podiatry

## 2023-11-27 DIAGNOSIS — L02619 Cutaneous abscess of unspecified foot: Secondary | ICD-10-CM

## 2023-11-27 DIAGNOSIS — L03119 Cellulitis of unspecified part of limb: Secondary | ICD-10-CM

## 2023-11-27 DIAGNOSIS — Z9889 Other specified postprocedural states: Secondary | ICD-10-CM

## 2023-11-27 DIAGNOSIS — M86271 Subacute osteomyelitis, right ankle and foot: Secondary | ICD-10-CM

## 2023-11-27 NOTE — Progress Notes (Signed)
  Subjective:  Patient ID: Alexander Maynard, male    DOB: 10-13-1945,  MRN: [id number]   DOS: 10/05/2023 Procedure: 1. Amputation of hallux at mid proximal phalanx level, right foot 2. 2nd metatarsal head resection, right foot  78 y.o. male seen for post op check.  Patient reports he is doing well.  He denies pain he has been doing mupirocin  and Band-Aid dressing changes daily to the wound on the top of the right foot.  Denies much drainage says it seems to be improving slowly  Review of Systems: Negative except as noted in the HPI. Denies N/V/F/Ch.   Objective:  There were no vitals filed for this visit. There is no height or weight on file to calculate BMI. Constitutional Well developed. Well nourished.  Vascular Foot warm and well perfused. Capillary refill normal to all digits.   No calf pain with palpation  Neurologic Normal speech. Oriented to person, place, and time. Epicritic sensation absent to forefoot  Dermatologic The plantar second met head ulceration and the hallux amputation site are fully healed.  Dorsal ulceration overlying second metatarsal head resection with fibrotic tissues present in the wound bed.  Wound does appear to be healing inside the side as well as filling in slowly.  No erythema malodor or evidence of frank infection     Orthopedic: Status post partial hallux amputation second metatarsal head resection on the right foot   Radiographs: Transmetatarsal amputation of the second ray. Resection of the first toe at the proximal phalanx.  Pathology: A. TOE, RIGHT FIRST METATARSAL, AMPUTATION:  - Skin with ulceration and associated necrotizing inflammation   B. BONE, RIGHT SECOND METATARSAL HEAD, PROXIMAL MARGINS:  - Benign bone and underlying marrow showing trilineage hematopoiesis  - No evidence of acute osteomyelitis   Micro: Multiple organisms none predominant  Assessment:   1. Post-operative state   2. Subacute osteomyelitis of right foot (HCC)    3. Cellulitis and abscess of foot, except toes        Plan:  Patient was evaluated and treated and all questions answered.  7 weeks s/p right foot hallux amputation and second metatarsal head resection - Doing very well with current wound care regimen will continue as prior -Debrided the dorsal second met ulceration with 15 blade scalpel to healthy bleeding base. -Continue every other day dressing change with mupirocin  ointment to the dorsal second metatarsal  ulceration and then cover with adhesive bandage -XR: Expected postop changes -WB Status: Minimal weightbearing as tolerated in diabetic/orthopedic shoe -Medications/ABX: Continue  mupirocin  ointment from prior -Foot redressed with mupirocin  ointment and adhesive bandage dressing. - Return in 3 week for wound check        Maridee Shoemaker, DPM [facility] / [facility]

## 2023-11-30 DIAGNOSIS — E782 Mixed hyperlipidemia: Secondary | ICD-10-CM | POA: Diagnosis not present

## 2023-11-30 DIAGNOSIS — E559 Vitamin D deficiency, unspecified: Secondary | ICD-10-CM | POA: Diagnosis not present

## 2023-11-30 DIAGNOSIS — D559 Anemia due to enzyme disorder, unspecified: Secondary | ICD-10-CM | POA: Diagnosis not present

## 2023-11-30 DIAGNOSIS — E875 Hyperkalemia: Secondary | ICD-10-CM | POA: Diagnosis not present

## 2023-11-30 DIAGNOSIS — E039 Hypothyroidism, unspecified: Secondary | ICD-10-CM | POA: Diagnosis not present

## 2023-12-04 DIAGNOSIS — E039 Hypothyroidism, unspecified: Secondary | ICD-10-CM | POA: Diagnosis not present

## 2023-12-04 DIAGNOSIS — Z1389 Encounter for screening for other disorder: Secondary | ICD-10-CM | POA: Diagnosis not present

## 2023-12-04 DIAGNOSIS — Z0001 Encounter for general adult medical examination with abnormal findings: Secondary | ICD-10-CM | POA: Diagnosis not present

## 2023-12-04 DIAGNOSIS — D473 Essential (hemorrhagic) thrombocythemia: Secondary | ICD-10-CM | POA: Diagnosis not present

## 2023-12-04 DIAGNOSIS — K2100 Gastro-esophageal reflux disease with esophagitis, without bleeding: Secondary | ICD-10-CM | POA: Diagnosis not present

## 2023-12-04 DIAGNOSIS — G2581 Restless legs syndrome: Secondary | ICD-10-CM | POA: Diagnosis not present

## 2023-12-04 DIAGNOSIS — Z0000 Encounter for general adult medical examination without abnormal findings: Secondary | ICD-10-CM | POA: Diagnosis not present

## 2023-12-16 DIAGNOSIS — Z45018 Encounter for adjustment and management of other part of cardiac pacemaker: Secondary | ICD-10-CM | POA: Diagnosis not present

## 2023-12-16 DIAGNOSIS — Z950 Presence of cardiac pacemaker: Secondary | ICD-10-CM | POA: Diagnosis not present

## 2023-12-16 DIAGNOSIS — I442 Atrioventricular block, complete: Secondary | ICD-10-CM | POA: Diagnosis not present

## 2023-12-18 ENCOUNTER — Ambulatory Visit (INDEPENDENT_AMBULATORY_CARE_PROVIDER_SITE_OTHER): Admitting: Podiatry

## 2023-12-18 DIAGNOSIS — Z9889 Other specified postprocedural states: Secondary | ICD-10-CM

## 2023-12-18 DIAGNOSIS — L03119 Cellulitis of unspecified part of limb: Secondary | ICD-10-CM

## 2023-12-18 DIAGNOSIS — M86271 Subacute osteomyelitis, right ankle and foot: Secondary | ICD-10-CM

## 2023-12-18 DIAGNOSIS — L02619 Cutaneous abscess of unspecified foot: Secondary | ICD-10-CM

## 2023-12-18 NOTE — Progress Notes (Signed)
  Subjective:  Patient ID: Alexander Maynard, male    DOB: 12-21-1945,  MRN: [id number]   DOS: 10/05/2023 Procedure: 1. Amputation of hallux at mid proximal phalanx level, right foot 2. 2nd metatarsal head resection, right foot  78 y.o. male seen for post op check.  Now about 10 weeks out from surgery patient reports he is doing well.  He denies pain he has been doing mupirocin  and Band-Aid dressing changes daily to the wound on the top of the right foot.  Denies any drainage says it seems to be improving slowly.  Review of Systems: Negative except as noted in the HPI. Denies N/V/F/Ch.   Objective:  There were no vitals filed for this visit. There is no height or weight on file to calculate BMI. Constitutional Well developed. Well nourished.  Vascular Foot warm and well perfused. Capillary refill normal to all digits.   No calf pain with palpation  Neurologic Normal speech. Oriented to person, place, and time. Epicritic sensation absent to forefoot  Dermatologic The plantar second met head ulceration and the hallux amputation site are fully healed.  Dorsal ulceration overlying second metatarsal head resection with fibrotic tissues present in the wound bed, smaller size from prior.  Wound does appear to be healing inside the side as well as filling in slowly.  No erythema malodor or evidence of frank infection     Orthopedic: Status post partial hallux amputation second metatarsal head resection on the right foot   Radiographs: Transmetatarsal amputation of the second ray. Resection of the first toe at the proximal phalanx.  Pathology: A. TOE, RIGHT FIRST METATARSAL, AMPUTATION:  - Skin with ulceration and associated necrotizing inflammation   B. BONE, RIGHT SECOND METATARSAL HEAD, PROXIMAL MARGINS:  - Benign bone and underlying marrow showing trilineage hematopoiesis  - No evidence of acute osteomyelitis   Micro: Multiple organisms none predominant  Assessment:   1.  Post-operative state   2. Subacute osteomyelitis of right foot (HCC)   3. Cellulitis and abscess of foot, except toes         Plan:  Patient was evaluated and treated and all questions answered.  10 weeks s/p right foot hallux amputation and second metatarsal head resection - Doing very well with current wound care regimen will continue as prior -Debrided the dorsal second met ulceration with tissue nipper to healthy bleeding base. -Continue every other day dressing change with mupirocin  ointment to the dorsal second metatarsal  ulceration and then cover with adhesive bandage -XR: Expected postop changes -WB Status: Minimal weightbearing as tolerated in diabetic/orthopedic shoe -Medications/ABX: Continue  mupirocin  ointment from prior -Foot redressed with mupirocin  ointment and adhesive bandage dressing. - Return in 4 weeks for wound check        Maridee Shoemaker, DPM [facility] / [facility]

## 2024-01-09 ENCOUNTER — Telehealth: Payer: Self-pay | Admitting: Podiatry

## 2024-01-09 NOTE — Telephone Encounter (Signed)
 Patient is currently scheduled for their 4-week POV #6 follow-up on 6/24, but is requesting to reschedule to 7/8 due to another surgery already scheduled on 6/24.  Would you prefer:  That another provider see the patient prior to 6/24, OR Is it ok for the patient to reschedule with you on 7/8?

## 2024-01-09 NOTE — Telephone Encounter (Signed)
 Dr. Rosemarie Conquest approved rescheduling the patient's POV #6 follow-up to 7/8 at 1:30 PM. Appointment has been successfully rescheduled.  Patient Communication: Attempted to contact the patient twice by phone. Left a voicemail with the updated appointment detail

## 2024-01-15 ENCOUNTER — Encounter: Admitting: Podiatry

## 2024-01-29 ENCOUNTER — Ambulatory Visit (INDEPENDENT_AMBULATORY_CARE_PROVIDER_SITE_OTHER): Admitting: Podiatry

## 2024-01-29 DIAGNOSIS — M86271 Subacute osteomyelitis, right ankle and foot: Secondary | ICD-10-CM

## 2024-01-29 DIAGNOSIS — Z9889 Other specified postprocedural states: Secondary | ICD-10-CM | POA: Diagnosis not present

## 2024-01-29 DIAGNOSIS — L02619 Cutaneous abscess of unspecified foot: Secondary | ICD-10-CM | POA: Diagnosis not present

## 2024-01-29 DIAGNOSIS — L03119 Cellulitis of unspecified part of limb: Secondary | ICD-10-CM | POA: Diagnosis not present

## 2024-01-29 NOTE — Progress Notes (Signed)
  Subjective:  Patient ID: Alexander Maynard, male    DOB: [birth year],  MRN: [id number]   DOS: 10/05/2023 Procedure: 1. Amputation of hallux at mid proximal phalanx level, right foot 2. 2nd metatarsal head resection, right foot  78 y.o. male seen for post op check.  Now about 15 weeks out from surgery patient reports he is doing well.  Patient has been doing Band-Aid dressing changes with antibiotic ointment.  He says the wound is fully healed no drainage at this time no concerns.  Review of Systems: Negative except as noted in the HPI. Denies N/V/F/Ch.   Objective:  There were no vitals filed for this visit. There is no height or weight on file to calculate BMI. Constitutional Well developed. Well nourished.  Vascular Foot warm and well perfused. Capillary refill normal to all digits.   No calf pain with palpation  Neurologic Normal speech. Oriented to person, place, and time. Epicritic sensation absent to forefoot  Dermatologic The plantar second met head ulceration and the hallux amputation site are fully healed.  Dorsal ulceration overlying second metatarsal head resection is now fully healed no evidence of open wound or any drainage.      Orthopedic: Status post partial hallux amputation second metatarsal head resection on the right foot   Radiographs: Transmetatarsal amputation of the second ray. Resection of the first toe at the proximal phalanx.  Pathology: A. TOE, RIGHT FIRST METATARSAL, AMPUTATION:  - Skin with ulceration and associated necrotizing inflammation   B. BONE, RIGHT SECOND METATARSAL HEAD, PROXIMAL MARGINS:  - Benign bone and underlying marrow showing trilineage hematopoiesis  - No evidence of acute osteomyelitis   Micro: Multiple organisms none predominant  Assessment:   1. Subacute osteomyelitis of right foot (HCC)   2. Cellulitis and abscess of foot, except toes   3. Post-operative state     Plan:  Patient was evaluated and treated and all  questions answered.  15 weeks s/p right foot hallux amputation and second metatarsal head resection - Patient is now fully healed at this time no open wound present on the right foot amputation site and second met resection site fully healed -No further dressing or antibiotic ointment required -XR: Expected postop changes -WB Status: As tolerated in regular shoes with diabetic liner which she has -Medications/ABX: None required -Follow-up as needed        Marolyn JULIANNA Honour, DPM [facility] / [facility]

## 2024-02-04 DIAGNOSIS — R001 Bradycardia, unspecified: Secondary | ICD-10-CM | POA: Diagnosis not present

## 2024-02-12 DIAGNOSIS — Z01810 Encounter for preprocedural cardiovascular examination: Secondary | ICD-10-CM | POA: Diagnosis not present

## 2024-02-12 DIAGNOSIS — Z950 Presence of cardiac pacemaker: Secondary | ICD-10-CM | POA: Diagnosis not present

## 2024-02-12 DIAGNOSIS — R001 Bradycardia, unspecified: Secondary | ICD-10-CM | POA: Diagnosis not present

## 2024-02-12 DIAGNOSIS — R9439 Abnormal result of other cardiovascular function study: Secondary | ICD-10-CM | POA: Diagnosis not present

## 2024-02-19 DIAGNOSIS — D473 Essential (hemorrhagic) thrombocythemia: Secondary | ICD-10-CM | POA: Diagnosis not present

## 2024-02-19 DIAGNOSIS — G2581 Restless legs syndrome: Secondary | ICD-10-CM | POA: Diagnosis not present

## 2024-02-19 DIAGNOSIS — E782 Mixed hyperlipidemia: Secondary | ICD-10-CM | POA: Diagnosis not present

## 2024-02-20 ENCOUNTER — Telehealth: Payer: Self-pay | Admitting: *Deleted

## 2024-02-20 NOTE — Telephone Encounter (Signed)
 Received referral from Jerel Sieving, MD with [facility].  [phone] telephone/ [phone]~ fax  Reason for referral: Hernia, Inguinal R  Requested that patient be seen urgently as he is in pain from hernia.   Reviewed patient chart.   Patient was seen at [facility] in [place] on 10/18/2023. Cardiac clearance was requested.   Patient is seen at [facility] at [facility]. Stress test was ordered on 02/04/2024. Stress test was noted abnormal and left heart catheterization was advised on 02/13/2024. Catheterization is scheduled for 02/27/2024.  Call placed to [facility]. Advised that patient can be seen at next available appointment, but no surgery can proceed until clearance from cardiology is received.   Advised if patient is unable to tolerate pain, he will need to be seen in ER, preferably at tertiary care where cardiac status may be observed.

## 2024-02-27 ENCOUNTER — Ambulatory Visit: Attending: Internal Medicine | Admitting: Internal Medicine

## 2024-02-27 ENCOUNTER — Encounter: Payer: Self-pay | Admitting: Internal Medicine

## 2024-02-27 VITALS — BP 134/80 | HR 60 | Ht 71.0 in | Wt 197.0 lb

## 2024-02-27 DIAGNOSIS — I480 Paroxysmal atrial fibrillation: Secondary | ICD-10-CM

## 2024-02-27 DIAGNOSIS — K4090 Unilateral inguinal hernia, without obstruction or gangrene, not specified as recurrent: Secondary | ICD-10-CM | POA: Diagnosis not present

## 2024-02-27 DIAGNOSIS — K449 Diaphragmatic hernia without obstruction or gangrene: Secondary | ICD-10-CM | POA: Diagnosis not present

## 2024-02-27 DIAGNOSIS — Z950 Presence of cardiac pacemaker: Secondary | ICD-10-CM

## 2024-02-27 DIAGNOSIS — I351 Nonrheumatic aortic (valve) insufficiency: Secondary | ICD-10-CM | POA: Diagnosis not present

## 2024-02-27 DIAGNOSIS — Z01810 Encounter for preprocedural cardiovascular examination: Secondary | ICD-10-CM | POA: Diagnosis not present

## 2024-02-27 DIAGNOSIS — R9439 Abnormal result of other cardiovascular function study: Secondary | ICD-10-CM | POA: Diagnosis not present

## 2024-02-27 NOTE — Progress Notes (Signed)
 Cardiology Office Note  Date: 02/27/2024   ID: Alexander Maynard, DOB 01/15/1946, MRN [id number]  PCP:  Toribio Jerel MATSU, MD  Cardiologist:  Lynwood Schilling, MD Electrophysiologist:  Soyla Gladis Norton, MD   History of Present Illness: Alexander Maynard is a 78 y.o. male  Referred to cardiology clinic for preop cardiac risk stratification for hernia surgery.  Records from Care Everywhere reviewed.  Patient underwent PVI for paroxysmal atrial fibrillation in April 2024, on Xarelto.  He also underwent dual-chamber pacemaker placement for complete heart block in December 2024 at [facility].  He was previously seen by [facility] cardiology for preop cardiac stratification for hernia surgery and due to METs less than 4, he underwent NM stress test in July 2025 that was abnormal.  There was a moderate fixed defect involving the mid inferior, mid inferoseptal, basal inferior segments consistent with possible attenuation artifact and cannot rule out scar.  There is small partially reversible perfusion defect involving the apical septal and apical segments consistent with possible attenuation artifact/motion artifact and cannot rule out ischemia.  Nuclear LVEF was 45%.  Echocardiogram in December 2024 showed LVEF 55 to 60%, normal RV function and size, mild MR and mild AI.  Patient is here to establish care.  Does not have any symptoms of angina or DOE.  [place] a Saturday and [place] the city.  He is not overly active but can do his household chores with no issues.  He thinks he can walk at his own pace for about 1 mile and climb 2 flights of stairs but has never tried it.  He has no limitations doing so.  I reviewed records and discussed with the patient.    Past Medical History:  Diagnosis Date   Enlarged prostate without lower urinary tract symptoms (luts)    Essential thrombocythemia (HCC)    Hyperlipidemia    Hypothyroidism    Male erectile disorder    Other specified polyneuropathies    Polycythemia,  secondary    Presence of permanent cardiac pacemaker    Restless leg syndrome    Venous thrombosis    There is a mention of this in his notes.  He is on chronic anticoagulation.    Past Surgical History:  Procedure Laterality Date   AMPUTATION OF SECOND TOE     AMPUTATION TOE Right 10/05/2023   Procedure: AMPUTATION, TOE, 1st toe partial;  Surgeon: Malvin Marsa FALCON, DPM;  [facility]: [facility] OR;  Service: Orthopedics/Podiatry;  Laterality: Right;   ATRIAL FIBRILLATION ABLATION N/A 10/26/2022   Procedure: ATRIAL FIBRILLATION ABLATION;  Surgeon: Norton Soyla Gladis, MD;  Location: [facility] INVASIVE CV LAB;  Service: Cardiovascular;  Laterality: N/A;   CATARACT EXTRACTION Left 09/2020   CATARACT EXTRACTION W/PHACO Left 10/08/2020   Procedure: CATARACT EXTRACTION PHACO AND INTRAOCULAR LENS PLACEMENT LEFT EYE;  Surgeon: Harrie Lynwood, MD;  Location: [facility] ORS;  Service: Ophthalmology;  Laterality: Left;  CDE 23.63   CATARACT EXTRACTION W/PHACO Right 10/22/2020   Procedure: CATARACT EXTRACTION PHACO AND INTRAOCULAR LENS PLACEMENT (IOC);  Surgeon: Harrie Lynwood, MD;  Location: [facility] ORS;  Service: Ophthalmology;  Laterality: Right;  CDE: 16.83    COLONOSCOPY  2009   INGUINAL HERNIA REPAIR  1998   LARYNGEAL POLYP     METATARSAL HEAD EXCISION Right 10/05/2023   Procedure: EXCISION, METATARSAL BONE, HEAD, 2nd;  Surgeon: Malvin Marsa FALCON, DPM;  Location: [facility] OR;  Service: Orthopedics/Podiatry;  Laterality: Right;   TONSILLECTOMY     TRANSURETHRAL RESECTION OF PROSTATE  2016   UPPER GASTROINTESTINAL ENDOSCOPY      Current Outpatient Medications  Medication Sig Dispense Refill   diclofenac Sodium (VOLTAREN) 1 % GEL Apply 4 g topically 4 (four) times daily as needed (for pain).     finasteride (PROSCAR) 5 MG tablet Take 5 mg by mouth daily.     gabapentin  (NEURONTIN ) 300 MG capsule Take 300 mg by mouth 2 (two) times daily.     hydroxyurea  (HYDREA ) 500 MG capsule Take 1,000 mg by mouth daily. May take with  food to minimize GI side effects.     levothyroxine  (SYNTHROID ) 125 MCG tablet Take 125 mcg by mouth daily before breakfast.     Multiple Vitamin (MULTIVITAMIN WITH MINERALS) TABS tablet Take 1 tablet by mouth daily.     mupirocin  ointment (BACTROBAN ) 2 % Apply 1 Application topically daily. 22 g 0   mupirocin  ointment (BACTROBAN ) 2 % Apply 1 Application topically daily. 22 g 0   omeprazole (PRILOSEC) 20 MG capsule Take 20 mg by mouth daily.     rivaroxaban (XARELTO) 20 MG TABS tablet Take 20 mg by mouth every morning.     tamsulosin  (FLOMAX ) 0.4 MG CAPS capsule Take 0.4 mg by mouth in the morning and at bedtime.     aspirin 81 MG EC tablet Take 81 mg by mouth daily. (Patient not taking: Reported on 02/27/2024)     sildenafil (VIAGRA) 100 MG tablet Take 50 mg by mouth daily as needed for erectile dysfunction. (Patient not taking: Reported on 02/27/2024)     No current facility-administered medications for this visit.   Allergies:  Patient has no known allergies.   Social History: The patient  reports that he has quit smoking. His smoking use included cigarettes. He has never used smokeless tobacco. He reports that he does not drink alcohol  and does not use drugs.   Family History: The patient's family history includes Chronic Renal Failure in his brother; Heart attack (age of onset: 72) in his father; Pneumonia in his mother; Prostate cancer in his brother.   ROS:  Please see the history of present illness. Otherwise, complete review of systems is positive for none  All other systems are reviewed and negative.   Physical Exam: VS:  BP 134/80 (BP Location: Right Arm, Cuff Size: Normal)   Pulse 60   Ht 5' 11 (1.803 m)   Wt 197 lb (89.4 kg)   SpO2 100%   BMI 27.48 kg/m , BMI Body mass index is 27.48 kg/m.  Wt Readings from Last 3 Encounters:  02/27/24 197 lb (89.4 kg)  10/30/23 182 lb 15.7 oz (83 kg)  10/11/23 182 lb 15.7 oz (83 kg)    General: Patient appears comfortable at  rest. HEENT: Conjunctiva and lids normal, oropharynx clear with moist mucosa. Neck: Supple, no elevated JVP or carotid bruits, no thyromegaly. Lungs: Clear to auscultation, nonlabored breathing at rest. Cardiac: Regular rate and rhythm, no S3 or significant systolic murmur, no pericardial rub. Abdomen: Soft, nontender, no hepatomegaly, bowel sounds present, no guarding or rebound. Extremities: No pitting edema, distal pulses 2+. Skin: Warm and dry. Musculoskeletal: No kyphosis. Neuropsychiatric: Alert and oriented x3, affect grossly appropriate.  Recent Labwork: 05/08/2023: ALT 10; AST 19 10/06/2023: BUN 14; Creatinine, Ser 1.00; Hemoglobin 10.3; Platelets 352; Potassium 4.1; Sodium 136  No results found for: CHOL, TRIG, HDL, CHOLHDL, VLDL, LDLCALC, LDLDIRECT  Other Studies Reviewed Today:   Assessment and Plan:  Preop cardiac risk stratification for hernia surgery - Does not have  any symptoms of angina or DOE. - EKG showed AV dual paced rhythm. - He underwent NM stress test in July 2025 after seeing [facility] cardiology that showed attenuation artifact/scar in the mid inferior, mid inferoseptal and basal inferior segments.  It also showed small partially reversible defect in the apical septal and apical segments consistent with possible attenuation/motion artifact but  not able to rule out ischemia.  Nuclear LVEF was 45% but echocardiogram performed in December 2024 showed normal LVEF. - NM stress test findings are low risk, no large ischemic territories noted. - Patient is at a low risk for any perioperative cardiac complications. - No further cardiac workup is indicated prior to proceeding with the procedure. - He would prefer to switch his care from [facility] surgery to [facility] general surgery.  His PCP, Dr. Toribio strongly recommended Dr. Mavis, will place general surgery referral. - Hold Xarelto 2 days prior to procedure and resume when safe from bleeding standpoint.  Abnormal NM  stress test - He underwent NM stress test in July 2025 after seeing [facility] cardiology that showed attenuation artifact/scar in the mid inferior, mid inferoseptal and basal inferior segments.  It also showed small partially reversible defect in the apical septal and apical segments consistent with possible attenuation/motion artifact but  not able to rule out ischemia.  Nuclear LVEF was 45% but echocardiogram performed in December 2024 showed normal LVEF. - NM stress test findings are low risk, no large ischemic territories noted. -No indication for LHC at this time.  Persistent atrial fibrillation s/p PVI in 2024 at [facility] - EKG shows AV dual paced rhythm. - Not on AV nodal agents. - Continue Xarelto 20 mg daily with supper.  CHB s/p PPM - Referral to physiology for device checks.  Valvular heart disease (mild MR and mild AR) - Can consider repeating echocardiogram every 3 to 5 years for surveillance.   Records from Care Everywhere reviewed, and discussed with the patient.  Medication Adjustments/Labs and Tests Ordered: Current medicines are reviewed at length with the patient today.  Concerns regarding medicines are outlined above.    Disposition:  Follow up 1 year  Signed Katelyn Broadnax Priya Artyom Stencel, MD, 02/27/2024 3:37 PM    [facility] at [facility] [address] [place], [place], [place] [address]

## 2024-02-27 NOTE — Patient Instructions (Addendum)
 Medication Instructions:  Your physician recommends that you continue on your current medications as directed. Please refer to the Current Medication list given to you today.   Labwork: None  Testing/Procedures: None  Follow-Up: Your physician recommends that you schedule a follow-up appointment in: 1 year. You will receive a reminder call in about 8 months reminding you to schedule your appointment. If you don't receive this call, please contact our office.   Any Other Special Instructions Will Be Listed Below (If Applicable). Referral to Electrophysiology  Referral to General Surgery  Thank you for choosing [facility]!     If you need a refill on your cardiac medications before your next appointment, please call your pharmacy.

## 2024-03-03 ENCOUNTER — Telehealth: Payer: Self-pay | Admitting: Internal Medicine

## 2024-03-03 DIAGNOSIS — Z01810 Encounter for preprocedural cardiovascular examination: Secondary | ICD-10-CM | POA: Insufficient documentation

## 2024-03-03 DIAGNOSIS — I351 Nonrheumatic aortic (valve) insufficiency: Secondary | ICD-10-CM | POA: Insufficient documentation

## 2024-03-03 DIAGNOSIS — Z950 Presence of cardiac pacemaker: Secondary | ICD-10-CM | POA: Insufficient documentation

## 2024-03-03 DIAGNOSIS — R9439 Abnormal result of other cardiovascular function study: Secondary | ICD-10-CM | POA: Insufficient documentation

## 2024-03-03 NOTE — Telephone Encounter (Signed)
 Pt came  into [place] office asking if we sent office notes to Dr.Daniel after his surgical clearance visit last week. He said Dr.Mallipeddi told him she was going to fax them after his appointment.

## 2024-03-03 NOTE — Telephone Encounter (Signed)
 Patient phone has no vm, called wife and notified her that office note was sent to Dr.Terry.

## 2024-03-06 ENCOUNTER — Telehealth: Payer: Self-pay | Admitting: Cardiology

## 2024-03-06 ENCOUNTER — Ambulatory Visit (INDEPENDENT_AMBULATORY_CARE_PROVIDER_SITE_OTHER): Admitting: General Surgery

## 2024-03-06 ENCOUNTER — Other Ambulatory Visit: Payer: Self-pay

## 2024-03-06 ENCOUNTER — Encounter: Payer: Self-pay | Admitting: General Surgery

## 2024-03-06 VITALS — BP 130/69 | HR 75 | Temp 97.6°F | Resp 16 | Ht 71.0 in | Wt 197.2 lb

## 2024-03-06 DIAGNOSIS — K4090 Unilateral inguinal hernia, without obstruction or gangrene, not specified as recurrent: Secondary | ICD-10-CM | POA: Diagnosis not present

## 2024-03-06 NOTE — Progress Notes (Signed)
 Alexander Maynard; [id number]; [birth year]   HPI Patient is a 78 year old white male who was referred to my care by Dr. Jerel Sieving for evaluation treatment of a right inguinal hernia.  Patient has had the right inguinal hernia for some time.  It is causing him discomfort.  It is made worse with straining.  He was recently seen by Dr. Stacia of cardiology who has cleared him for surgery.  He does have a permanent pacemaker in place.  He was on Xarelto but has stopped it on his own in anticipation of having surgery. Past Medical History:  Diagnosis Date   Enlarged prostate without lower urinary tract symptoms (luts)    Essential thrombocythemia (HCC)    Hyperlipidemia    Hypothyroidism    Male erectile disorder    Other specified polyneuropathies    Polycythemia, secondary    Presence of permanent cardiac pacemaker    Restless leg syndrome    Venous thrombosis    There is a mention of this in his notes.  He is on chronic anticoagulation.    Past Surgical History:  Procedure Laterality Date   AMPUTATION OF SECOND TOE     AMPUTATION TOE Right 10/05/2023   Procedure: AMPUTATION, TOE, 1st toe partial;  Surgeon: Malvin Marsa FALCON, DPM;  Location: [facility] OR;  Service: Orthopedics/Podiatry;  Laterality: Right;   ATRIAL FIBRILLATION ABLATION N/A 10/26/2022   Procedure: ATRIAL FIBRILLATION ABLATION;  Surgeon: Inocencio Soyla Lunger, MD;  Location: [facility] INVASIVE CV LAB;  Service: Cardiovascular;  Laterality: N/A;   CATARACT EXTRACTION Left 09/2020   CATARACT EXTRACTION W/PHACO Left 10/08/2020   Procedure: CATARACT EXTRACTION PHACO AND INTRAOCULAR LENS PLACEMENT LEFT EYE;  Surgeon: Harrie Agent, MD;  Location: [facility] ORS;  Service: Ophthalmology;  Laterality: Left;  CDE 23.63   CATARACT EXTRACTION W/PHACO Right 10/22/2020   Procedure: CATARACT EXTRACTION PHACO AND INTRAOCULAR LENS PLACEMENT (IOC);  Surgeon: Harrie Agent, MD;  Location: [facility] ORS;  Service: Ophthalmology;  Laterality: Right;  CDE: 16.83     COLONOSCOPY  2009   INGUINAL HERNIA REPAIR  1998   LARYNGEAL POLYP     METATARSAL HEAD EXCISION Right 10/05/2023   Procedure: EXCISION, METATARSAL BONE, HEAD, 2nd;  Surgeon: Malvin Marsa FALCON, DPM;  Location: [facility] OR;  Service: Orthopedics/Podiatry;  Laterality: Right;   TONSILLECTOMY     TRANSURETHRAL RESECTION OF PROSTATE  2016   UPPER GASTROINTESTINAL ENDOSCOPY      Family History  Problem Relation Age of Onset   Pneumonia Mother    Heart attack Father 21   Prostate cancer Brother    Chronic Renal Failure Brother     Current Outpatient Medications on File Prior to Visit  Medication Sig Dispense Refill   diclofenac Sodium (VOLTAREN) 1 % GEL Apply 4 g topically 4 (four) times daily as needed (for pain).     finasteride  (PROSCAR ) 5 MG tablet Take 5 mg by mouth daily.     gabapentin  (NEURONTIN ) 300 MG capsule Take 300 mg by mouth 2 (two) times daily.     hydroxyurea  (HYDREA ) 500 MG capsule Take 1,000 mg by mouth daily. May take with food to minimize GI side effects.     levothyroxine  (SYNTHROID ) 125 MCG tablet Take 125 mcg by mouth daily before breakfast.     Multiple Vitamin (MULTIVITAMIN WITH MINERALS) TABS tablet Take 1 tablet by mouth daily.     omeprazole (PRILOSEC) 20 MG capsule Take 20 mg by mouth daily.     rivaroxaban (XARELTO) 20 MG TABS tablet Take 20  mg by mouth every morning.     tamsulosin  (FLOMAX ) 0.4 MG CAPS capsule Take 0.4 mg by mouth in the morning and at bedtime.     No current facility-administered medications on file prior to visit.    No Known Allergies  Social History   Substance and Sexual Activity  Alcohol  Use Never    Social History   Tobacco Use  Smoking Status Former   Types: Cigarettes  Smokeless Tobacco Never    Review of Systems  Constitutional: Negative.   HENT: Negative.    Eyes: Negative.   Respiratory: Negative.    Cardiovascular: Negative.   Gastrointestinal: Negative.   Genitourinary: Negative.   Musculoskeletal:  Negative.   Skin: Negative.   Neurological: Negative.   Endo/Heme/Allergies: Negative.   Psychiatric/Behavioral: Negative.      Objective   Vitals:   03/06/24 0907  BP: 130/69  Pulse: 75  Resp: 16  Temp: 97.6 F (36.4 C)  SpO2: 96%    Physical Exam Vitals reviewed.  Constitutional:      Appearance: Normal appearance. He is not ill-appearing.  HENT:     Head: Normocephalic and atraumatic.  Cardiovascular:     Rate and Rhythm: Normal rate and regular rhythm.     Heart sounds: Normal heart sounds. No murmur heard.    No friction rub. No gallop.  Pulmonary:     Effort: Pulmonary effort is normal. No respiratory distress.     Breath sounds: Normal breath sounds. No stridor. No wheezing, rhonchi or rales.  Abdominal:     General: Bowel sounds are normal. There is no distension.     Palpations: Abdomen is soft. There is no mass.     Tenderness: There is no abdominal tenderness. There is no guarding or rebound.     Hernia: A hernia is present.     Comments: A large right inguinal hernia is present.  This is partially reducible.  No left inguinal hernia present.  Genitourinary:    Testes: Normal.  Skin:    General: Skin is warm.  Neurological:     Mental Status: He is alert and oriented to person, place, and time.   Cardiology notes reviewed  Assessment  Right inguinal hernia Permanent dual pacemaker present History of paroxysmal atrial fibrillation, currently in normal sinus rhythm. History of chronic anticoagulation, has been off Xarelto Plan  Patient is scheduled for robotic assisted laparoscopic right inguinal herniorrhaphy with mesh on 03/12/2024.  The risks and benefits of the procedure including bleeding, infection, mesh use, and the possibility of recurrence of the hernia as well as the possibility of an open procedure were fully explained to the patient, who gave informed consent.

## 2024-03-06 NOTE — Patient Instructions (Signed)
 Alexander Maynard  03/06/2024     @PREFPERIOPPHARMACY @   Your procedure is scheduled on 03/12/2024.    Report to [facility] at  1000 A.M.   Call this number if you have problems the morning of surgery:  [phone]  If you experience any cold or flu symptoms such as cough, fever, chills, shortness of breath, etc. between now and your scheduled surgery, please notify us  at the above number.   Remember:         Your last dose of xarelto should be on 03/09/2024.   Do not eat after midnight.  You may drink clear liquids until 0800 am on 03/12/2024.    Clear liquids allowed are:                    Water , Juice (No red color; non-citric and without pulp; diabetics please choose diet or no sugar options), Carbonated beverages (diabetics please choose diet or no sugar options), Clear Tea (No creamer, milk, or cream, including half & half and powdered creamer), Black Coffee Only (No creamer, milk or cream, including half & half and powdered creamer), and Clear Sports drink (No red color; diabetics please choose diet or no sugar options)    Take these medicines the morning of surgery with A SIP OF WATER       finasteride, gabapentin , levothyroxine , omeprazole, tamsulosin .      Do not wear jewelry, make-up or nail polish, including gel polish,  artificial nails, or any other type of covering on natural nails (fingers and  toes).  Do not wear lotions, powders, or perfumes, or deodorant.  Do not shave 48 hours prior to surgery.  Men may shave face and neck.  Do not bring valuables to the hospital.  [facility] is not responsible for any belongings or valuables.  Contacts, dentures or bridgework may not be worn into surgery.  Leave your suitcase in the car.  After surgery it may be brought to your room.  For patients admitted to the hospital, discharge time will be determined by your treatment team.  Patients discharged the day of surgery will not be allowed to drive home and must  have someone with them for 24 hours.    Special instructions:  DO NOT smoke tobacco or vape for 24 hours before your procedure.  Please read over the following fact sheets that you were given. Coughing and Deep Breathing, Surgical Site Infection Prevention, Anesthesia Post-op Instructions, and Care and Recovery After Surgery      Laparoscopic Surgery for Groin Hernia in Adults: What to Know After After a laparoscopic surgery for groin hernia, it's common to have pain, discomfort, soreness, swelling, and bruising around the cuts that were made in the belly. There may also be swelling of the scrotum in males. Follow these instructions at home: Activity Rest as told. Get up and take short walks many times during the day. This helps you breathe better and keeps your blood flowing. Ask for help if you feel weak or unsteady. Ask if it's OK for you to lift. Do not take baths, swim, or use a hot tub until you're told it's OK. Ask if you can shower. Ask what things are safe for you to do at home. Ask when you can go back to work or school. Medicines Take your medicines only as told. You may need to take steps to help treat or prevent trouble pooping (constipation), such as: Taking medicine to  help you poop. Eating foods high in fiber, like beans, whole grains, and fresh fruits and vegetables. Drinking more fluids as told. Ask your health care provider if it's safe to drive or use machines while taking your medicine. Wound care  Take care of the cuts in your belly as told. Make sure you: Wash your hands with soap and water  for at least 20 seconds before and after you change your bandage. If you can't use soap and water , use hand sanitizer. Change your bandage. Leave stitches or skin glue alone. Leave tape strips alone unless you're told to take them off. You may trim the edges of the tape strips if they curl up. Check the cuts on your belly every day for signs of infection. Check for: More  redness, swelling, or pain. More fluid or blood. Warmth. Pus or a bad smell. Pain management  Use ice or an ice pack as told. Place a towel between your skin and the ice. Leave the ice on for 20 minutes, 2-3 times a day. If your skin turns red, take off the ice right away to prevent skin damage. The risk of damage is higher if you can't feel pain, heat, or cold. General instructions Do not smoke, vape, or use nicotine or tobacco. Doing this can slow healing. Wear compression stockings to reduce swelling and help prevent blood clots in your legs. You may be asked to continue to do deep breathing exercises at home. This will help to prevent a lung infection. Your provider may give you more instructions. Make sure you know what you can and can't do. Contact a health care provider if: You have any signs of infection. You have more swelling or pain in your scrotum. You have pain that gets worse or doesn't get better with medicine. You aren't able to pee. You haven't pooped in 3 days. You have a fever. You throw up or you feel like throwing up. Get help right away if: You have redness, warmth, or pain in your leg. You have chest pain. You have trouble breathing. You have very bad pain in your belly. You throw up each time you eat or drink. These symptoms may be an emergency. Call 911 right away. Do not wait to see if the symptoms will go away. Do not drive yourself to the hospital. This information is not intended to replace advice given to you by your health care provider. Make sure you discuss any questions you have with your health care provider. Document Revised: 04/24/2023 Document Reviewed: 04/24/2023 Elsevier Patient Education  2025 Else[employer].General Anesthesia, Adult, Care After The following information offers guidance on how to care for yourself after your procedure. Your health care provider may also give you more specific instructions. If you have problems or questions,  contact your health care provider. What can I expect after the procedure? After the procedure, it is common for people to: Have pain or discomfort at the IV site. Have nausea or vomiting. Have a sore throat or hoarseness. Have trouble concentrating. Feel cold or chills. Feel weak, sleepy, or tired (fatigue). Have soreness and body aches. These can affect parts of the body that were not involved in surgery. Follow these instructions at home: For the time period you were told by your health care provider:  Rest. Do not participate in activities where you could fall or become injured. Do not drive or use machinery. Do not drink alcohol . Do not take sleeping pills or medicines that cause drowsiness. Do not  make important decisions or sign legal documents. Do not take care of children on your own. General instructions Drink enough fluid to keep your urine pale yellow. If you have sleep apnea, surgery and certain medicines can increase your risk for breathing problems. Follow instructions from your health care provider about wearing your sleep device: Anytime you are sleeping, including during daytime naps. While taking prescription pain medicines, sleeping medicines, or medicines that make you drowsy. Return to your normal activities as told by your health care provider. Ask your health care provider what activities are safe for you. Take over-the-counter and prescription medicines only as told by your health care provider. Do not use any products that contain nicotine or tobacco. These products include cigarettes, chewing tobacco, and vaping devices, such as e-cigarettes. These can delay incision healing after surgery. If you need help quitting, ask your health care provider. Contact a health care provider if: You have nausea or vomiting that does not get better with medicine. You vomit every time you eat or drink. You have pain that does not get better with medicine. You cannot urinate  or have bloody urine. You develop a skin rash. You have a fever. Get help right away if: You have trouble breathing. You have chest pain. You vomit blood. These symptoms may be an emergency. Get help right away. Call 911. Do not wait to see if the symptoms will go away. Do not drive yourself to the hospital. Summary After the procedure, it is common to have a sore throat, hoarseness, nausea, vomiting, or to feel weak, sleepy, or fatigue. For the time period you were told by your health care provider, do not drive or use machinery. Get help right away if you have difficulty breathing, have chest pain, or vomit blood. These symptoms may be an emergency. This information is not intended to replace advice given to you by your health care provider. Make sure you discuss any questions you have with your health care provider. Document Revised: 10/07/2021 Document Reviewed: 10/07/2021 Elsevier Patient Education  2024 Elsevier Inc.How to Use Chlorhexidine  at Home in the Shower Chlorhexidine  gluconate (CHG) is a germ-killing (antiseptic) wash that's used to clean the skin. It can get rid of the germs that normally live on the skin and can keep them away for about 24 hours. If you're having surgery, you may be told to shower with CHG at home the night before surgery. This can help lower your risk for infection. To use CHG wash in the shower, follow the steps below. Supplies needed: CHG body wash. Clean washcloth. Clean towel. How to use CHG in the shower Follow these steps unless you're told to use CHG in a different way: Start the shower. Use your normal soap and shampoo to wash your face and hair. Turn off the shower or move out of the shower stream. Pour CHG onto a clean washcloth. Do not use any type of brush or rough sponge. Start at your neck, washing your body down to your toes. Make sure you: Wash the part of your body where the surgery will be done for at least 1 minute. Do not  scrub. Do not use CHG on your head or face unless your health care provider tells you to. If it gets into your ears or eyes, rinse them well with water . Do not wash your genitals with CHG. Wash your back and under your arms. Make sure to wash skin folds. Let the CHG sit on your skin for 1-2 minutes  or as long as told. Rinse your entire body in the shower, including all body creases and folds. Turn off the shower. Dry off with a clean towel. Do not put anything on your skin afterward, such as powder, lotion, or perfume. Put on clean clothes or pajamas. If it's the night before surgery, sleep in clean sheets. General tips Use CHG only as told, and follow the instructions on the label. Use the full amount of CHG as told. This is often one bottle. Do not smoke and stay away from flames after using CHG. Your skin may feel sticky after using CHG. This is normal. The sticky feeling will go away as the CHG dries. Do not use CHG: If you have a chlorhexidine  allergy or have reacted to chlorhexidine  in the past. On open wounds or areas of skin that have broken skin, cuts, or scrapes. On babies younger than 59 months of age. Contact a health care provider if: You have questions about using CHG. Your skin gets irritated or itchy. You have a rash after using CHG. You swallow any CHG. Call your local poison control center [phone] in the [place] Your eyes itch badly, or they become very red or swollen. Your hearing changes. You have trouble seeing. If you can't reach your provider, go to an urgent care or emergency room. Do not drive yourself. Get help right away if: You have swelling or tingling in your mouth or throat. You make high-pitched whistling sounds when you breathe, most often when you breathe out (wheeze). You have trouble breathing. These symptoms may be an emergency. Call 911 right away. Do not wait to see if the symptoms will go away. Do not drive yourself to the hospital. This  information is not intended to replace advice given to you by your health care provider. Make sure you discuss any questions you have with your health care provider. Document Revised: 01/23/2023 Document Reviewed: 01/19/2022 Elsevier Patient Education  2024 [employer].

## 2024-03-06 NOTE — H&P (Signed)
 Alexander Maynard; [id number]; 08/19/1945   HPI Patient is a 78 year old white male who was referred to my care by Dr. Jerel Sieving for evaluation treatment of a right inguinal hernia.  Patient has had the right inguinal hernia for some time.  It is causing him discomfort.  It is made worse with straining.  He was recently seen by Dr. Stacia of cardiology who has cleared him for surgery.  He does have a permanent pacemaker in place.  He was on Xarelto but has stopped it on his own in anticipation of having surgery. Past Medical History:  Diagnosis Date   Enlarged prostate without lower urinary tract symptoms (luts)    Essential thrombocythemia (HCC)    Hyperlipidemia    Hypothyroidism    Male erectile disorder    Other specified polyneuropathies    Polycythemia, secondary    Presence of permanent cardiac pacemaker    Restless leg syndrome    Venous thrombosis    There is a mention of this in his notes.  He is on chronic anticoagulation.    Past Surgical History:  Procedure Laterality Date   AMPUTATION OF SECOND TOE     AMPUTATION TOE Right 10/05/2023   Procedure: AMPUTATION, TOE, 1st toe partial;  Surgeon: Malvin Marsa FALCON, DPM;  Location: [facility] OR;  Service: Orthopedics/Podiatry;  Laterality: Right;   ATRIAL FIBRILLATION ABLATION N/A 10/26/2022   Procedure: ATRIAL FIBRILLATION ABLATION;  Surgeon: Inocencio Soyla Lunger, MD;  Location: [facility] INVASIVE CV LAB;  Service: Cardiovascular;  Laterality: N/A;   CATARACT EXTRACTION Left 09/2020   CATARACT EXTRACTION W/PHACO Left 10/08/2020   Procedure: CATARACT EXTRACTION PHACO AND INTRAOCULAR LENS PLACEMENT LEFT EYE;  Surgeon: Harrie Agent, MD;  Location: [facility] ORS;  Service: Ophthalmology;  Laterality: Left;  CDE 23.63   CATARACT EXTRACTION W/PHACO Right 10/22/2020   Procedure: CATARACT EXTRACTION PHACO AND INTRAOCULAR LENS PLACEMENT (IOC);  Surgeon: Harrie Agent, MD;  Location: [facility] ORS;  Service: Ophthalmology;  Laterality: Right;  CDE: 16.83     COLONOSCOPY  2009   INGUINAL HERNIA REPAIR  1998   LARYNGEAL POLYP     METATARSAL HEAD EXCISION Right 10/05/2023   Procedure: EXCISION, METATARSAL BONE, HEAD, 2nd;  Surgeon: Malvin Marsa FALCON, DPM;  Location: [facility] OR;  Service: Orthopedics/Podiatry;  Laterality: Right;   TONSILLECTOMY     TRANSURETHRAL RESECTION OF PROSTATE  2016   UPPER GASTROINTESTINAL ENDOSCOPY      Family History  Problem Relation Age of Onset   Pneumonia Mother    Heart attack Father 24   Prostate cancer Brother    Chronic Renal Failure Brother     Current Outpatient Medications on File Prior to Visit  Medication Sig Dispense Refill   diclofenac Sodium (VOLTAREN) 1 % GEL Apply 4 g topically 4 (four) times daily as needed (for pain).     finasteride (PROSCAR) 5 MG tablet Take 5 mg by mouth daily.     gabapentin  (NEURONTIN ) 300 MG capsule Take 300 mg by mouth 2 (two) times daily.     hydroxyurea  (HYDREA ) 500 MG capsule Take 1,000 mg by mouth daily. May take with food to minimize GI side effects.     levothyroxine  (SYNTHROID ) 125 MCG tablet Take 125 mcg by mouth daily before breakfast.     Multiple Vitamin (MULTIVITAMIN WITH MINERALS) TABS tablet Take 1 tablet by mouth daily.     omeprazole (PRILOSEC) 20 MG capsule Take 20 mg by mouth daily.     rivaroxaban (XARELTO) 20 MG TABS tablet Take 20  mg by mouth every morning.     tamsulosin  (FLOMAX ) 0.4 MG CAPS capsule Take 0.4 mg by mouth in the morning and at bedtime.     No current facility-administered medications on file prior to visit.    No Known Allergies  Social History   Substance and Sexual Activity  Alcohol  Use Never    Social History   Tobacco Use  Smoking Status Former   Types: Cigarettes  Smokeless Tobacco Never    Review of Systems  Constitutional: Negative.   HENT: Negative.    Eyes: Negative.   Respiratory: Negative.    Cardiovascular: Negative.   Gastrointestinal: Negative.   Genitourinary: Negative.   Musculoskeletal:  Negative.   Skin: Negative.   Neurological: Negative.   Endo/Heme/Allergies: Negative.   Psychiatric/Behavioral: Negative.      Objective   Vitals:   03/06/24 0907  BP: 130/69  Pulse: 75  Resp: 16  Temp: 97.6 F (36.4 C)  SpO2: 96%    Physical Exam Vitals reviewed.  Constitutional:      Appearance: Normal appearance. He is not ill-appearing.  HENT:     Head: Normocephalic and atraumatic.  Cardiovascular:     Rate and Rhythm: Normal rate and regular rhythm.     Heart sounds: Normal heart sounds. No murmur heard.    No friction rub. No gallop.  Pulmonary:     Effort: Pulmonary effort is normal. No respiratory distress.     Breath sounds: Normal breath sounds. No stridor. No wheezing, rhonchi or rales.  Abdominal:     General: Bowel sounds are normal. There is no distension.     Palpations: Abdomen is soft. There is no mass.     Tenderness: There is no abdominal tenderness. There is no guarding or rebound.     Hernia: A hernia is present.     Comments: A large right inguinal hernia is present.  This is partially reducible.  No left inguinal hernia present.  Genitourinary:    Testes: Normal.  Skin:    General: Skin is warm.  Neurological:     Mental Status: He is alert and oriented to person, place, and time.   Cardiology notes reviewed  Assessment  Right inguinal hernia Permanent dual pacemaker present History of paroxysmal atrial fibrillation, currently in normal sinus rhythm. History of chronic anticoagulation, has been off Xarelto Plan  Patient is scheduled for robotic assisted laparoscopic right inguinal herniorrhaphy with mesh on 03/12/2024.  The risks and benefits of the procedure including bleeding, infection, mesh use, and the possibility of recurrence of the hernia as well as the possibility of an open procedure were fully explained to the patient, who gave informed consent.

## 2024-03-06 NOTE — Telephone Encounter (Signed)
  Pt is requesting to switch from Dr. Inocencio to Dr. Nancey. Pt wants to follow up at the [facility] office

## 2024-03-10 ENCOUNTER — Encounter (HOSPITAL_COMMUNITY)
Admission: RE | Admit: 2024-03-10 | Discharge: 2024-03-10 | Disposition: A | Discharge status: Home / Self Care | Source: Ambulatory Visit | Attending: General Surgery | Admitting: General Surgery

## 2024-03-10 ENCOUNTER — Encounter (HOSPITAL_COMMUNITY): Payer: Self-pay

## 2024-03-10 DIAGNOSIS — I480 Paroxysmal atrial fibrillation: Secondary | ICD-10-CM | POA: Insufficient documentation

## 2024-03-10 DIAGNOSIS — Z01810 Encounter for preprocedural cardiovascular examination: Secondary | ICD-10-CM | POA: Diagnosis not present

## 2024-03-10 HISTORY — DX: Cardiac arrhythmia, unspecified: I49.9

## 2024-03-10 HISTORY — DX: Unspecified atrial fibrillation: I48.91

## 2024-03-14 ENCOUNTER — Encounter (HOSPITAL_COMMUNITY): Payer: Self-pay

## 2024-03-14 ENCOUNTER — Encounter (HOSPITAL_COMMUNITY)
Admission: RE | Admit: 2024-03-14 | Discharge: 2024-03-14 | Disposition: A | Discharge status: Home / Self Care | Source: Ambulatory Visit | Attending: General Surgery | Admitting: General Surgery

## 2024-03-16 DIAGNOSIS — Z950 Presence of cardiac pacemaker: Secondary | ICD-10-CM | POA: Diagnosis not present

## 2024-03-16 DIAGNOSIS — I442 Atrioventricular block, complete: Secondary | ICD-10-CM | POA: Diagnosis not present

## 2024-03-18 ENCOUNTER — Observation Stay (HOSPITAL_COMMUNITY): Admitting: Anesthesiology

## 2024-03-18 ENCOUNTER — Ambulatory Visit (HOSPITAL_COMMUNITY): Payer: Self-pay | Admitting: Anesthesiology

## 2024-03-18 ENCOUNTER — Encounter (HOSPITAL_COMMUNITY): Payer: Self-pay | Admitting: General Surgery

## 2024-03-18 ENCOUNTER — Observation Stay (HOSPITAL_COMMUNITY)

## 2024-03-18 ENCOUNTER — Encounter (HOSPITAL_COMMUNITY): Admission: AD | Disposition: E | Payer: Self-pay | Source: Home / Self Care | Attending: General Surgery

## 2024-03-18 ENCOUNTER — Other Ambulatory Visit: Payer: Self-pay

## 2024-03-18 ENCOUNTER — Observation Stay (HOSPITAL_BASED_OUTPATIENT_CLINIC_OR_DEPARTMENT_OTHER)

## 2024-03-18 ENCOUNTER — Inpatient Hospital Stay (HOSPITAL_COMMUNITY)
Admission: AD | Admit: 2024-03-18 | Discharge: 2024-03-24 | DRG: 907 | Disposition: E | Attending: Internal Medicine | Admitting: Internal Medicine

## 2024-03-18 DIAGNOSIS — D751 Secondary polycythemia: Secondary | ICD-10-CM | POA: Diagnosis present

## 2024-03-18 DIAGNOSIS — E039 Hypothyroidism, unspecified: Secondary | ICD-10-CM

## 2024-03-18 DIAGNOSIS — Z9842 Cataract extraction status, left eye: Secondary | ICD-10-CM

## 2024-03-18 DIAGNOSIS — R739 Hyperglycemia, unspecified: Secondary | ICD-10-CM | POA: Diagnosis not present

## 2024-03-18 DIAGNOSIS — Z87891 Personal history of nicotine dependence: Secondary | ICD-10-CM

## 2024-03-18 DIAGNOSIS — R29818 Other symptoms and signs involving the nervous system: Secondary | ICD-10-CM | POA: Diagnosis not present

## 2024-03-18 DIAGNOSIS — R0989 Other specified symptoms and signs involving the circulatory and respiratory systems: Secondary | ICD-10-CM | POA: Diagnosis not present

## 2024-03-18 DIAGNOSIS — I499 Cardiac arrhythmia, unspecified: Secondary | ICD-10-CM | POA: Diagnosis not present

## 2024-03-18 DIAGNOSIS — K559 Vascular disorder of intestine, unspecified: Secondary | ICD-10-CM

## 2024-03-18 DIAGNOSIS — I9589 Other hypotension: Secondary | ICD-10-CM | POA: Diagnosis not present

## 2024-03-18 DIAGNOSIS — Z961 Presence of intraocular lens: Secondary | ICD-10-CM | POA: Diagnosis present

## 2024-03-18 DIAGNOSIS — Z950 Presence of cardiac pacemaker: Secondary | ICD-10-CM | POA: Diagnosis present

## 2024-03-18 DIAGNOSIS — K91840 Postprocedural hemorrhage and hematoma of a digestive system organ or structure following a digestive system procedure: Principal | ICD-10-CM | POA: Diagnosis present

## 2024-03-18 DIAGNOSIS — R569 Unspecified convulsions: Secondary | ICD-10-CM | POA: Diagnosis not present

## 2024-03-18 DIAGNOSIS — K4090 Unilateral inguinal hernia, without obstruction or gangrene, not specified as recurrent: Secondary | ICD-10-CM

## 2024-03-18 DIAGNOSIS — Z66 Do not resuscitate: Secondary | ICD-10-CM | POA: Diagnosis not present

## 2024-03-18 DIAGNOSIS — N401 Enlarged prostate with lower urinary tract symptoms: Secondary | ICD-10-CM | POA: Diagnosis present

## 2024-03-18 DIAGNOSIS — K8689 Other specified diseases of pancreas: Secondary | ICD-10-CM | POA: Diagnosis not present

## 2024-03-18 DIAGNOSIS — E872 Acidosis, unspecified: Secondary | ICD-10-CM | POA: Diagnosis present

## 2024-03-18 DIAGNOSIS — I10 Essential (primary) hypertension: Secondary | ICD-10-CM | POA: Diagnosis not present

## 2024-03-18 DIAGNOSIS — T380X5A Adverse effect of glucocorticoids and synthetic analogues, initial encounter: Secondary | ICD-10-CM | POA: Diagnosis not present

## 2024-03-18 DIAGNOSIS — R161 Splenomegaly, not elsewhere classified: Secondary | ICD-10-CM | POA: Diagnosis not present

## 2024-03-18 DIAGNOSIS — N138 Other obstructive and reflux uropathy: Secondary | ICD-10-CM | POA: Diagnosis present

## 2024-03-18 DIAGNOSIS — I9581 Postprocedural hypotension: Secondary | ICD-10-CM | POA: Diagnosis not present

## 2024-03-18 DIAGNOSIS — G2581 Restless legs syndrome: Secondary | ICD-10-CM | POA: Diagnosis present

## 2024-03-18 DIAGNOSIS — Z89421 Acquired absence of other right toe(s): Secondary | ICD-10-CM | POA: Diagnosis not present

## 2024-03-18 DIAGNOSIS — Z8249 Family history of ischemic heart disease and other diseases of the circulatory system: Secondary | ICD-10-CM

## 2024-03-18 DIAGNOSIS — D123 Benign neoplasm of transverse colon: Secondary | ICD-10-CM | POA: Diagnosis not present

## 2024-03-18 DIAGNOSIS — R918 Other nonspecific abnormal finding of lung field: Secondary | ICD-10-CM | POA: Diagnosis not present

## 2024-03-18 DIAGNOSIS — D45 Polycythemia vera: Secondary | ICD-10-CM | POA: Diagnosis present

## 2024-03-18 DIAGNOSIS — Z4682 Encounter for fitting and adjustment of non-vascular catheter: Secondary | ICD-10-CM | POA: Diagnosis not present

## 2024-03-18 DIAGNOSIS — R578 Other shock: Secondary | ICD-10-CM | POA: Diagnosis not present

## 2024-03-18 DIAGNOSIS — G9341 Metabolic encephalopathy: Secondary | ICD-10-CM | POA: Diagnosis not present

## 2024-03-18 DIAGNOSIS — J9811 Atelectasis: Secondary | ICD-10-CM | POA: Diagnosis not present

## 2024-03-18 DIAGNOSIS — K449 Diaphragmatic hernia without obstruction or gangrene: Secondary | ICD-10-CM | POA: Diagnosis not present

## 2024-03-18 DIAGNOSIS — I4891 Unspecified atrial fibrillation: Secondary | ICD-10-CM | POA: Diagnosis present

## 2024-03-18 DIAGNOSIS — Z8719 Personal history of other diseases of the digestive system: Secondary | ICD-10-CM | POA: Diagnosis not present

## 2024-03-18 DIAGNOSIS — Z841 Family history of disorders of kidney and ureter: Secondary | ICD-10-CM

## 2024-03-18 DIAGNOSIS — I480 Paroxysmal atrial fibrillation: Secondary | ICD-10-CM | POA: Diagnosis present

## 2024-03-18 DIAGNOSIS — D65 Disseminated intravascular coagulation [defibrination syndrome]: Secondary | ICD-10-CM | POA: Diagnosis not present

## 2024-03-18 DIAGNOSIS — K6389 Other specified diseases of intestine: Secondary | ICD-10-CM | POA: Diagnosis not present

## 2024-03-18 DIAGNOSIS — D72829 Elevated white blood cell count, unspecified: Secondary | ICD-10-CM | POA: Diagnosis not present

## 2024-03-18 DIAGNOSIS — Y92239 Unspecified place in hospital as the place of occurrence of the external cause: Secondary | ICD-10-CM | POA: Diagnosis not present

## 2024-03-18 DIAGNOSIS — Z9889 Other specified postprocedural states: Secondary | ICD-10-CM | POA: Diagnosis not present

## 2024-03-18 DIAGNOSIS — Z79899 Other long term (current) drug therapy: Secondary | ICD-10-CM

## 2024-03-18 DIAGNOSIS — Z79890 Hormone replacement therapy (postmenopausal): Secondary | ICD-10-CM

## 2024-03-18 DIAGNOSIS — E785 Hyperlipidemia, unspecified: Secondary | ICD-10-CM | POA: Diagnosis present

## 2024-03-18 DIAGNOSIS — J9601 Acute respiratory failure with hypoxia: Secondary | ICD-10-CM | POA: Diagnosis not present

## 2024-03-18 DIAGNOSIS — Z515 Encounter for palliative care: Secondary | ICD-10-CM

## 2024-03-18 DIAGNOSIS — N179 Acute kidney failure, unspecified: Secondary | ICD-10-CM | POA: Diagnosis not present

## 2024-03-18 DIAGNOSIS — K922 Gastrointestinal hemorrhage, unspecified: Secondary | ICD-10-CM | POA: Diagnosis not present

## 2024-03-18 DIAGNOSIS — J90 Pleural effusion, not elsewhere classified: Secondary | ICD-10-CM | POA: Diagnosis not present

## 2024-03-18 DIAGNOSIS — I700 Atherosclerosis of aorta: Secondary | ICD-10-CM | POA: Diagnosis not present

## 2024-03-18 DIAGNOSIS — Z9841 Cataract extraction status, right eye: Secondary | ICD-10-CM

## 2024-03-18 DIAGNOSIS — N529 Male erectile dysfunction, unspecified: Secondary | ICD-10-CM | POA: Diagnosis present

## 2024-03-18 DIAGNOSIS — K5730 Diverticulosis of large intestine without perforation or abscess without bleeding: Secondary | ICD-10-CM | POA: Diagnosis not present

## 2024-03-18 DIAGNOSIS — Z7901 Long term (current) use of anticoagulants: Secondary | ICD-10-CM

## 2024-03-18 DIAGNOSIS — R0602 Shortness of breath: Secondary | ICD-10-CM | POA: Diagnosis not present

## 2024-03-18 DIAGNOSIS — D6489 Other specified anemias: Secondary | ICD-10-CM | POA: Diagnosis not present

## 2024-03-18 DIAGNOSIS — Y838 Other surgical procedures as the cause of abnormal reaction of the patient, or of later complication, without mention of misadventure at the time of the procedure: Secondary | ICD-10-CM | POA: Diagnosis not present

## 2024-03-18 HISTORY — PX: LAPAROTOMY: SHX154

## 2024-03-18 HISTORY — PX: PARTIAL COLECTOMY: SHX5273

## 2024-03-18 HISTORY — PX: XI ROBOTIC ASSISTED INGUINAL HERNIA REPAIR WITH MESH: SHX6706

## 2024-03-18 LAB — CBC
HCT: 20.4 % — ABNORMAL LOW (ref 39.0–52.0)
HCT: 19.5 % — ABNORMAL LOW (ref 39.0–52.0)
Hemoglobin: 6.4 g/dL — CL (ref 13.0–17.0)
Hemoglobin: 6.1 g/dL — CL (ref 13.0–17.0)
MCH: 34 pg (ref 26.0–34.0)
MCH: 31.1 pg (ref 26.0–34.0)
MCHC: 31.4 g/dL (ref 30.0–36.0)
MCHC: 31.3 g/dL (ref 30.0–36.0)
MCV: 108.5 fL — ABNORMAL HIGH (ref 80.0–100.0)
MCV: 99.5 fL (ref 80.0–100.0)
Platelets: 767 K/uL — ABNORMAL HIGH (ref 150–400)
Platelets: 485 K/uL — ABNORMAL HIGH (ref 150–400)
RBC: 1.88 MIL/uL — ABNORMAL LOW (ref 4.22–5.81)
RBC: 1.96 MIL/uL — ABNORMAL LOW (ref 4.22–5.81)
RDW: 18.8 % — ABNORMAL HIGH (ref 11.5–15.5)
RDW: 17 % — ABNORMAL HIGH (ref 11.5–15.5)
WBC: 95.7 K/uL (ref 4.0–10.5)
WBC: 94 K/uL (ref 4.0–10.5)
nRBC: 3.5 % — ABNORMAL HIGH (ref 0.0–0.2)
nRBC: 6.2 % — ABNORMAL HIGH (ref 0.0–0.2)

## 2024-03-18 LAB — CBC WITH DIFFERENTIAL/PLATELET
Abs Immature Granulocytes: 7.5 K/uL — ABNORMAL HIGH (ref 0.00–0.07)
Basophils Absolute: 1.2 K/uL — ABNORMAL HIGH (ref 0.0–0.1)
Basophils Relative: 2 %
Eosinophils Absolute: 0 K/uL (ref 0.0–0.5)
Eosinophils Relative: 0 %
HCT: 27.9 % — ABNORMAL LOW (ref 39.0–52.0)
Hemoglobin: 8.9 g/dL — ABNORMAL LOW (ref 13.0–17.0)
Lymphocytes Relative: 7 %
Lymphs Abs: 4.3 K/uL — ABNORMAL HIGH (ref 0.7–4.0)
MCH: 34.5 pg — ABNORMAL HIGH (ref 26.0–34.0)
MCHC: 31.9 g/dL (ref 30.0–36.0)
MCV: 108.1 fL — ABNORMAL HIGH (ref 80.0–100.0)
Metamyelocytes Relative: 7 %
Monocytes Absolute: 1.2 K/uL — ABNORMAL HIGH (ref 0.1–1.0)
Monocytes Relative: 2 %
Myelocytes: 5 %
Neutro Abs: 47.8 K/uL — ABNORMAL HIGH (ref 1.7–7.7)
Neutrophils Relative %: 77 %
Platelets: 751 K/uL — ABNORMAL HIGH (ref 150–400)
RBC: 2.58 MIL/uL — ABNORMAL LOW (ref 4.22–5.81)
RDW: 18.5 % — ABNORMAL HIGH (ref 11.5–15.5)
Smear Review: NORMAL
WBC: 62.1 K/uL (ref 4.0–10.5)
nRBC: 0.3 % — ABNORMAL HIGH (ref 0.0–0.2)

## 2024-03-18 LAB — ECHOCARDIOGRAM COMPLETE
AR max vel: 2.45 cm2
AV Area VTI: 2.54 cm2
AV Area mean vel: 2.5 cm2
AV Mean grad: 5.3 mmHg
AV Peak grad: 12.5 mmHg
Ao pk vel: 1.76 m/s
Calc EF: 61.5 %
Height: 71 in
S' Lateral: 3.2 cm
Single Plane A2C EF: 60.5 %
Single Plane A4C EF: 62.1 %
Weight: 3155.19 [oz_av]

## 2024-03-18 LAB — BLOOD GAS, ARTERIAL
Acid-base deficit: 19.1 mmol/L — ABNORMAL HIGH (ref 0.0–2.0)
Acid-base deficit: 14.1 mmol/L — ABNORMAL HIGH (ref 0.0–2.0)
Bicarbonate: 8.6 mmol/L — ABNORMAL LOW (ref 20.0–28.0)
Bicarbonate: 12.3 mmol/L — ABNORMAL LOW (ref 20.0–28.0)
Drawn by: 22223
Drawn by: 22223
O2 Saturation: 100 %
O2 Saturation: 97.5 %
Patient temperature: 37
Patient temperature: 37
pCO2 arterial: 27 mmHg — ABNORMAL LOW (ref 32–48)
pCO2 arterial: 30 mmHg — ABNORMAL LOW (ref 32–48)
pH, Arterial: 7.11 — CL (ref 7.35–7.45)
pH, Arterial: 7.22 — ABNORMAL LOW (ref 7.35–7.45)
pO2, Arterial: 204 mmHg — ABNORMAL HIGH (ref 83–108)
pO2, Arterial: 85 mmHg (ref 83–108)

## 2024-03-18 LAB — RENAL FUNCTION PANEL
Albumin: 2.6 g/dL — ABNORMAL LOW (ref 3.5–5.0)
Anion gap: 18 — ABNORMAL HIGH (ref 5–15)
BUN: 17 mg/dL (ref 8–23)
CO2: 13 mmol/L — ABNORMAL LOW (ref 22–32)
Calcium: 8.1 mg/dL — ABNORMAL LOW (ref 8.9–10.3)
Chloride: 107 mmol/L (ref 98–111)
Creatinine, Ser: 1.49 mg/dL — ABNORMAL HIGH (ref 0.61–1.24)
GFR, Estimated: 47 mL/min — ABNORMAL LOW (ref >60)
Glucose, Bld: 331 mg/dL — ABNORMAL HIGH (ref 70–99)
Phosphorus: 6.3 mg/dL — ABNORMAL HIGH (ref 2.5–4.6)
Potassium: 4.7 mmol/L (ref 3.5–5.1)
Sodium: 138 mmol/L (ref 135–145)

## 2024-03-18 LAB — BASIC METABOLIC PANEL WITH GFR
Anion gap: 9 (ref 5–15)
BUN: 16 mg/dL (ref 8–23)
CO2: 21 mmol/L — ABNORMAL LOW (ref 22–32)
Calcium: 8.7 mg/dL — ABNORMAL LOW (ref 8.9–10.3)
Chloride: 107 mmol/L (ref 98–111)
Creatinine, Ser: 1.26 mg/dL — ABNORMAL HIGH (ref 0.61–1.24)
GFR, Estimated: 58 mL/min — ABNORMAL LOW (ref >60)
Glucose, Bld: 210 mg/dL — ABNORMAL HIGH (ref 70–99)
Potassium: 4.2 mmol/L (ref 3.5–5.1)
Sodium: 137 mmol/L (ref 135–145)

## 2024-03-18 LAB — ABO/RH: ABO/RH(D): AB NEG

## 2024-03-18 LAB — PROTIME-INR
INR: 3.7 — ABNORMAL HIGH (ref 0.8–1.2)
Prothrombin Time: 38.6 s — ABNORMAL HIGH (ref 11.4–15.2)

## 2024-03-18 LAB — TROPONIN I (HIGH SENSITIVITY)
Troponin I (High Sensitivity): 5 ng/L (ref <18)
Troponin I (High Sensitivity): 22 ng/L — ABNORMAL HIGH (ref <18)

## 2024-03-18 LAB — PREPARE RBC (CROSSMATCH)

## 2024-03-18 LAB — LACTIC ACID, PLASMA: Lactic Acid, Venous: >9 mmol/L (ref 0.5–1.9)

## 2024-03-18 LAB — GLUCOSE, CAPILLARY: Glucose-Capillary: 296 mg/dL — ABNORMAL HIGH (ref 70–99)

## 2024-03-18 LAB — MRSA NEXT GEN BY PCR, NASAL: MRSA by PCR Next Gen: NOT DETECTED

## 2024-03-18 SURGERY — REPAIR, HERNIA, INGUINAL, ROBOT-ASSISTED, LAPAROSCOPIC, USING MESH
Anesthesia: General | Site: Inguinal | Laterality: Right

## 2024-03-18 SURGERY — LAPAROTOMY, EXPLORATORY
Anesthesia: General | Site: Abdomen

## 2024-03-18 MED ORDER — AMIODARONE HCL IN DEXTROSE 360-4.14 MG/200ML-% IV SOLN
60.0000 mg/h | INTRAVENOUS | Status: AC
  Administered 2024-03-19: 60 mg/h via INTRAVENOUS

## 2024-03-18 MED ORDER — FENTANYL CITRATE (PF) 100 MCG/2ML IJ SOLN
INTRAMUSCULAR | Status: AC
  Filled 2024-03-18: qty 2

## 2024-03-18 MED ORDER — FENTANYL CITRATE PF 50 MCG/ML IJ SOSY
50.0000 ug | PREFILLED_SYRINGE | INTRAMUSCULAR | Status: DC | PRN
  Administered 2024-03-18: 50 ug via INTRAVENOUS
  Filled 2024-03-18 (×2): qty 1

## 2024-03-18 MED ORDER — EPINEPHRINE 1 MG/10ML IJ SOSY: 0.2000 mg | PREFILLED_SYRINGE | Freq: Once | INTRAMUSCULAR | Status: AC

## 2024-03-18 MED ORDER — ROCURONIUM BROMIDE 100 MG/10ML IV SOLN
INTRAVENOUS | Status: DC | PRN
  Administered 2024-03-18 (×2): 100 mg via INTRAVENOUS

## 2024-03-18 MED ORDER — AMIODARONE HCL IN DEXTROSE 360-4.14 MG/200ML-% IV SOLN
INTRAVENOUS | Status: AC
Start: 2024-03-18 — End: 2024-03-19
  Administered 2024-03-19: 60 mg/h via INTRAVENOUS
  Filled 2024-03-18: qty 400

## 2024-03-18 MED ORDER — PROPOFOL 10 MG/ML IV BOLUS
INTRAVENOUS | Status: AC
  Filled 2024-03-18: qty 20

## 2024-03-18 MED ORDER — OXYCODONE HCL 5 MG/5ML PO SOLN: 5.0000 mg | Freq: Once | ORAL | Status: DC | PRN

## 2024-03-18 MED ORDER — STERILE WATER FOR INJECTION IV SOLN
INTRAVENOUS | Status: DC
  Filled 2024-03-18 (×3): qty 1000

## 2024-03-18 MED ORDER — ONDANSETRON HCL 4 MG/2ML IJ SOLN
INTRAMUSCULAR | Status: AC
  Filled 2024-03-18: qty 2

## 2024-03-18 MED ORDER — ONDANSETRON HCL 4 MG/2ML IJ SOLN
4.0000 mg | Freq: Once | INTRAMUSCULAR | Status: DC | PRN
  Filled 2024-03-18: qty 2

## 2024-03-18 MED ORDER — DOCUSATE SODIUM 50 MG/5ML PO LIQD: 100.0000 mg | Freq: Two times a day (BID) | ORAL | Status: DC

## 2024-03-18 MED ORDER — PANTOPRAZOLE SODIUM 40 MG PO TBEC: 40.0000 mg | DELAYED_RELEASE_TABLET | Freq: Every day | ORAL | Status: DC

## 2024-03-18 MED ORDER — HEMOSTATIC AGENTS (NO CHARGE) OPTIME
TOPICAL | Status: DC | PRN
  Administered 2024-03-18: 1 via TOPICAL

## 2024-03-18 MED ORDER — KETAMINE HCL 50 MG/5ML IJ SOSY
PREFILLED_SYRINGE | INTRAMUSCULAR | Status: AC
  Filled 2024-03-18: qty 10

## 2024-03-18 MED ORDER — LACTATED RINGERS IV SOLN: INTRAVENOUS | Status: DC | PRN

## 2024-03-18 MED ORDER — KETOROLAC TROMETHAMINE 30 MG/ML IJ SOLN
INTRAMUSCULAR | Status: AC
Start: 2024-03-18 — End: 2024-03-18
  Filled 2024-03-18: qty 1

## 2024-03-18 MED ORDER — MIDAZOLAM HCL 2 MG/2ML IJ SOLN
1.0000 mg | INTRAMUSCULAR | Status: DC | PRN
  Administered 2024-03-18: 1 mg via INTRAVENOUS
  Filled 2024-03-18: qty 2

## 2024-03-18 MED ORDER — ORAL CARE MOUTH RINSE: 15.0000 mL | OROMUCOSAL | Status: DC | PRN

## 2024-03-18 MED ORDER — AMIODARONE HCL IN DEXTROSE 360-4.14 MG/200ML-% IV SOLN
30.0000 mg/h | INTRAVENOUS | Status: DC
  Administered 2024-03-19: 30 mg/h via INTRAVENOUS
  Filled 2024-03-18: qty 200

## 2024-03-18 MED ORDER — MIDAZOLAM HCL 2 MG/2ML IJ SOLN
INTRAMUSCULAR | Status: AC
Start: 2024-03-18 — End: 2024-03-18
  Administered 2024-03-18: 2 mg via INTRAVENOUS
  Filled 2024-03-18: qty 2

## 2024-03-18 MED ORDER — MIDAZOLAM HCL 2 MG/2ML IJ SOLN
INTRAMUSCULAR | Status: AC
  Filled 2024-03-18: qty 2

## 2024-03-18 MED ORDER — SODIUM CHLORIDE 0.9% IV SOLUTION: Freq: Once | INTRAVENOUS | Status: DC

## 2024-03-18 MED ORDER — PROPOFOL 10 MG/ML IV BOLUS
INTRAVENOUS | Status: DC | PRN
  Administered 2024-03-18: 160 mg via INTRAVENOUS

## 2024-03-18 MED ORDER — CHLORHEXIDINE GLUCONATE 0.12 % MT SOLN
15.0000 mL | Freq: Once | OROMUCOSAL | Status: AC
  Administered 2024-03-18: 15 mL via OROMUCOSAL
  Filled 2024-03-18: qty 15

## 2024-03-18 MED ORDER — VASOPRESSIN 20 UNITS/100 ML INFUSION FOR SHOCK
0.0000 [IU]/min | INTRAVENOUS | Status: DC
  Administered 2024-03-18: 0.03 [IU]/min via INTRAVENOUS
  Administered 2024-03-18: 0.04 [IU]/min via INTRAVENOUS
  Administered 2024-03-19: 0.03 [IU]/min via INTRAVENOUS
  Filled 2024-03-18 (×2): qty 100

## 2024-03-18 MED ORDER — STERILE WATER FOR IRRIGATION IR SOLN
Status: DC | PRN
  Administered 2024-03-18: 500 mL

## 2024-03-18 MED ORDER — OXYCODONE HCL 5 MG PO TABS: 5.0000 mg | ORAL_TABLET | Freq: Once | ORAL | Status: DC | PRN

## 2024-03-18 MED ORDER — SODIUM BICARBONATE 8.4 % IV SOLN
INTRAVENOUS | Status: AC
  Administered 2024-03-18: 50 meq via INTRAVENOUS
  Filled 2024-03-18: qty 50

## 2024-03-18 MED ORDER — DEXAMETHASONE SODIUM PHOSPHATE 10 MG/ML IJ SOLN
INTRAMUSCULAR | Status: DC | PRN
  Administered 2024-03-18: 8 mg via INTRAVENOUS

## 2024-03-18 MED ORDER — SUGAMMADEX SODIUM 200 MG/2ML IV SOLN
INTRAVENOUS | Status: AC
  Filled 2024-03-18: qty 2

## 2024-03-18 MED ORDER — SODIUM BICARBONATE 8.4 % IV SOLN
50.0000 meq | Freq: Once | INTRAVENOUS | Status: DC
  Filled 2024-03-18: qty 50

## 2024-03-18 MED ORDER — SODIUM CHLORIDE 0.9% FLUSH: 10.0000 mL | INTRAVENOUS | Status: DC | PRN

## 2024-03-18 MED ORDER — POLYETHYLENE GLYCOL 3350 17 G PO PACK: 17.0000 g | PACK | Freq: Every day | ORAL | Status: DC

## 2024-03-18 MED ORDER — PROPOFOL 1000 MG/100ML IV EMUL
0.0000 ug/kg/min | INTRAVENOUS | Status: DC
  Administered 2024-03-18: 5 ug/kg/min via INTRAVENOUS
  Filled 2024-03-18 (×2): qty 100

## 2024-03-18 MED ORDER — ORAL CARE MOUTH RINSE: 15.0000 mL | Freq: Once | OROMUCOSAL | Status: DC

## 2024-03-18 MED ORDER — HYDROMORPHONE HCL 1 MG/ML IJ SOLN
INTRAMUSCULAR | Status: AC
  Filled 2024-03-18: qty 1

## 2024-03-18 MED ORDER — ORAL CARE MOUTH RINSE
15.0000 mL | OROMUCOSAL | Status: DC
  Administered 2024-03-18 – 2024-03-19 (×2): 15 mL via OROMUCOSAL

## 2024-03-18 MED ORDER — CHLORHEXIDINE GLUCONATE CLOTH 2 % EX PADS: 6.0000 | MEDICATED_PAD | Freq: Every day | CUTANEOUS | Status: DC

## 2024-03-18 MED ORDER — KETOROLAC TROMETHAMINE 30 MG/ML IJ SOLN
INTRAMUSCULAR | Status: DC | PRN
  Administered 2024-03-18: 15 mg via INTRAVENOUS

## 2024-03-18 MED ORDER — LIDOCAINE 2% (20 MG/ML) 5 ML SYRINGE
INTRAMUSCULAR | Status: DC | PRN
  Administered 2024-03-18: 40 mg via INTRAVENOUS

## 2024-03-18 MED ORDER — FENTANYL CITRATE PF 50 MCG/ML IJ SOSY: 25.0000 ug | PREFILLED_SYRINGE | INTRAMUSCULAR | Status: DC | PRN

## 2024-03-18 MED ORDER — IOHEXOL 300 MG/ML  SOLN
100.0000 mL | Freq: Once | INTRAMUSCULAR | Status: AC | PRN
  Administered 2024-03-18: 100 mL via INTRAVENOUS

## 2024-03-18 MED ORDER — CEFAZOLIN SODIUM-DEXTROSE 2-4 GM/100ML-% IV SOLN
INTRAVENOUS | Status: AC
  Filled 2024-03-18: qty 100

## 2024-03-18 MED ORDER — DEXAMETHASONE SODIUM PHOSPHATE 10 MG/ML IJ SOLN
INTRAMUSCULAR | Status: AC
  Filled 2024-03-18: qty 1

## 2024-03-18 MED ORDER — SODIUM CHLORIDE 0.9 % IR SOLN
Status: DC | PRN
  Administered 2024-03-18 (×3): 1000 mL

## 2024-03-18 MED ORDER — INSULIN ASPART 100 UNIT/ML IJ SOLN
0.0000 [IU] | Freq: Three times a day (TID) | INTRAMUSCULAR | Status: DC
  Administered 2024-03-19: 3 [IU] via SUBCUTANEOUS

## 2024-03-18 MED ORDER — TRAMADOL HCL 50 MG PO TABS: 50.0000 mg | ORAL_TABLET | Freq: Four times a day (QID) | ORAL | Status: DC | PRN

## 2024-03-18 MED ORDER — CHLORHEXIDINE GLUCONATE 0.12 % MT SOLN: 15.0000 mL | Freq: Once | OROMUCOSAL | Status: DC

## 2024-03-18 MED ORDER — ACETAMINOPHEN 10 MG/ML IV SOLN
INTRAVENOUS | Status: AC
  Filled 2024-03-18: qty 100

## 2024-03-18 MED ORDER — CHLORHEXIDINE GLUCONATE CLOTH 2 % EX PADS: 6.0000 | MEDICATED_PAD | Freq: Once | CUTANEOUS | Status: DC

## 2024-03-18 MED ORDER — NOREPINEPHRINE 4 MG/250ML-% IV SOLN: 0.0000 ug/min | INTRAVENOUS | Status: DC

## 2024-03-18 MED ORDER — CEFAZOLIN SODIUM-DEXTROSE 2-3 GM-%(50ML) IV SOLR
INTRAVENOUS | Status: DC | PRN
Start: 2024-03-18 — End: 2024-03-18
  Administered 2024-03-18 (×2): 2 g via INTRAVENOUS

## 2024-03-18 MED ORDER — SODIUM BICARBONATE 8.4 % IV SOLN: 50.0000 meq | Freq: Once | INTRAVENOUS | Status: AC

## 2024-03-18 MED ORDER — CEFAZOLIN SODIUM-DEXTROSE 2-4 GM/100ML-% IV SOLN
2.0000 g | INTRAVENOUS | Status: AC
  Administered 2024-03-18: 2 g via INTRAVENOUS
  Filled 2024-03-18: qty 100

## 2024-03-18 MED ORDER — HYDROXYUREA 500 MG PO CAPS
1000.0000 mg | ORAL_CAPSULE | Freq: Every day | ORAL | Status: DC
  Filled 2024-03-18 (×2): qty 2

## 2024-03-18 MED ORDER — ROCURONIUM BROMIDE 10 MG/ML (PF) SYRINGE
PREFILLED_SYRINGE | INTRAVENOUS | Status: AC
  Filled 2024-03-18: qty 10

## 2024-03-18 MED ORDER — TRAMADOL HCL 50 MG PO TABS: 50.0000 mg | ORAL_TABLET | Freq: Four times a day (QID) | ORAL | 0 refills | Status: DC | PRN

## 2024-03-18 MED ORDER — ROCURONIUM BROMIDE 10 MG/ML (PF) SYRINGE
PREFILLED_SYRINGE | INTRAVENOUS | Status: AC
Start: 2024-03-18 — End: 2024-03-18
  Filled 2024-03-18: qty 10

## 2024-03-18 MED ORDER — ETOMIDATE 2 MG/ML IV SOLN
INTRAVENOUS | Status: AC
  Filled 2024-03-18: qty 20

## 2024-03-18 MED ORDER — BUPIVACAINE HCL (PF) 0.5 % IJ SOLN
INTRAMUSCULAR | Status: DC | PRN
  Administered 2024-03-18: 30 mL

## 2024-03-18 MED ORDER — SODIUM CHLORIDE 0.9% FLUSH: 10.0000 mL | Freq: Two times a day (BID) | INTRAVENOUS | Status: DC

## 2024-03-18 MED ORDER — ACETAMINOPHEN 325 MG PO TABS: 650.0000 mg | ORAL_TABLET | Freq: Four times a day (QID) | ORAL | Status: DC | PRN

## 2024-03-18 MED ORDER — ONDANSETRON HCL 4 MG/2ML IJ SOLN
4.0000 mg | Freq: Once | INTRAMUSCULAR | Status: AC | PRN
  Administered 2024-03-18: 4 mg via INTRAVENOUS

## 2024-03-18 MED ORDER — FENTANYL CITRATE (PF) 100 MCG/2ML IJ SOLN
INTRAMUSCULAR | Status: DC | PRN
  Administered 2024-03-18 (×2): 50 ug via INTRAVENOUS
  Administered 2024-03-18: 25 ug via INTRAVENOUS
  Administered 2024-03-18: 50 ug via INTRAVENOUS
  Administered 2024-03-18: 25 ug via INTRAVENOUS

## 2024-03-18 MED ORDER — PANTOPRAZOLE SODIUM 40 MG IV SOLR: 40.0000 mg | Freq: Every day | INTRAVENOUS | Status: DC

## 2024-03-18 MED ORDER — EPINEPHRINE 1 MG/10ML IJ SOSY
PREFILLED_SYRINGE | INTRAMUSCULAR | Status: AC
  Administered 2024-03-18: 0.2 mg via INTRAVENOUS
  Filled 2024-03-18: qty 10

## 2024-03-18 MED ORDER — CHLORHEXIDINE GLUCONATE CLOTH 2 % EX PADS
6.0000 | MEDICATED_PAD | Freq: Once | CUTANEOUS | Status: AC
  Administered 2024-03-18: 6 via TOPICAL

## 2024-03-18 MED ORDER — SODIUM CHLORIDE 0.9 % IV SOLN
INTRAVENOUS | Status: DC
Start: 2024-03-18 — End: 2024-03-18

## 2024-03-18 MED ORDER — HYDROMORPHONE HCL 1 MG/ML IJ SOLN
INTRAMUSCULAR | Status: DC | PRN
  Administered 2024-03-18: .5 mg via INTRAVENOUS

## 2024-03-18 MED ORDER — SUGAMMADEX SODIUM 200 MG/2ML IV SOLN
INTRAVENOUS | Status: DC | PRN
  Administered 2024-03-18: 200 mg via INTRAVENOUS

## 2024-03-18 MED ORDER — MORPHINE SULFATE (PF) 2 MG/ML IV SOLN: 2.0000 mg | INTRAVENOUS | Status: DC | PRN

## 2024-03-18 MED ORDER — ACETAMINOPHEN 10 MG/ML IV SOLN
INTRAVENOUS | Status: DC | PRN
Start: 2024-03-18 — End: 2024-03-18
  Administered 2024-03-18: 1000 mg via INTRAVENOUS

## 2024-03-18 MED ORDER — GABAPENTIN 300 MG PO CAPS: 300.0000 mg | ORAL_CAPSULE | Freq: Two times a day (BID) | ORAL | Status: DC

## 2024-03-18 MED ORDER — LIDOCAINE 2% (20 MG/ML) 5 ML SYRINGE
INTRAMUSCULAR | Status: AC
  Filled 2024-03-18: qty 5

## 2024-03-18 MED ORDER — ONDANSETRON HCL 4 MG/2ML IJ SOLN
INTRAMUSCULAR | Status: DC | PRN
  Administered 2024-03-18: 4 mg via INTRAVENOUS

## 2024-03-18 MED ORDER — SODIUM CHLORIDE 0.9 % IV SOLN: INTRAVENOUS | Status: DC

## 2024-03-18 MED ORDER — FENTANYL CITRATE PF 50 MCG/ML IJ SOSY
PREFILLED_SYRINGE | INTRAMUSCULAR | Status: AC
  Filled 2024-03-18: qty 2

## 2024-03-18 MED ORDER — NOREPINEPHRINE 4 MG/250ML-% IV SOLN
INTRAVENOUS | Status: AC
  Administered 2024-03-18: 4 mg via INTRAVENOUS
  Filled 2024-03-18: qty 250

## 2024-03-18 MED ORDER — LACTATED RINGERS IV SOLN: INTRAVENOUS | Status: DC

## 2024-03-18 MED ORDER — FENTANYL CITRATE PF 50 MCG/ML IJ SOSY: 50.0000 ug | PREFILLED_SYRINGE | INTRAMUSCULAR | Status: DC | PRN

## 2024-03-18 MED ORDER — MIDAZOLAM HCL 5 MG/5ML IJ SOLN
INTRAMUSCULAR | Status: DC | PRN
Start: 2024-03-18 — End: 2024-03-18
  Administered 2024-03-18: 2 mg via INTRAVENOUS

## 2024-03-18 MED ORDER — AMIODARONE LOAD VIA INFUSION
150.0000 mg | Freq: Once | INTRAVENOUS | Status: AC
  Administered 2024-03-19: 150 mg via INTRAVENOUS
  Filled 2024-03-18: qty 83.34

## 2024-03-18 MED ORDER — ORAL CARE MOUTH RINSE: 15.0000 mL | Freq: Once | OROMUCOSAL | Status: AC

## 2024-03-18 MED ORDER — LEVOTHYROXINE SODIUM 125 MCG PO TABS: 125.0000 ug | ORAL_TABLET | Freq: Every day | ORAL | Status: DC

## 2024-03-18 MED ORDER — PHENYLEPHRINE HCL-NACL 20-0.9 MG/250ML-% IV SOLN
0.0000 ug/min | INTRAVENOUS | Status: DC
  Administered 2024-03-18 (×2): 20 ug/min via INTRAVENOUS
  Administered 2024-03-18: 100 ug/min via INTRAVENOUS
  Administered 2024-03-19: 400 ug/min via INTRAVENOUS
  Administered 2024-03-19 (×3): 350 ug/min via INTRAVENOUS
  Administered 2024-03-19: 400 ug/min via INTRAVENOUS
  Administered 2024-03-19: 150 ug/min via INTRAVENOUS
  Administered 2024-03-19: 370 ug/min via INTRAVENOUS
  Filled 2024-03-18 (×3): qty 250
  Filled 2024-03-18: qty 500
  Filled 2024-03-18 (×2): qty 250

## 2024-03-18 MED ORDER — PHENYLEPHRINE 80 MCG/ML (10ML) SYRINGE FOR IV PUSH (FOR BLOOD PRESSURE SUPPORT)
PREFILLED_SYRINGE | INTRAVENOUS | Status: DC | PRN
  Administered 2024-03-18: 160 ug via INTRAVENOUS
  Administered 2024-03-18: 80 ug via INTRAVENOUS

## 2024-03-18 MED ORDER — ALBUMIN HUMAN 25 % IV SOLN
INTRAVENOUS | Status: AC
  Administered 2024-03-18: 500 g
  Filled 2024-03-18: qty 500

## 2024-03-18 MED ORDER — ALBUMIN HUMAN 25 % IV SOLN: INTRAVENOUS | Status: DC | PRN

## 2024-03-18 MED ORDER — TAMSULOSIN HCL 0.4 MG PO CAPS: 0.4000 mg | ORAL_CAPSULE | Freq: Every day | ORAL | Status: DC

## 2024-03-18 MED ORDER — PHENYLEPHRINE HCL-NACL 20-0.9 MG/250ML-% IV SOLN
INTRAVENOUS | Status: AC
  Administered 2024-03-18: 20 ug/min via INTRAVENOUS
  Filled 2024-03-18: qty 250

## 2024-03-18 MED ORDER — BUPIVACAINE HCL (PF) 0.5 % IJ SOLN
INTRAMUSCULAR | Status: AC
  Filled 2024-03-18: qty 30

## 2024-03-18 MED ORDER — SUCCINYLCHOLINE CHLORIDE 200 MG/10ML IV SOSY
PREFILLED_SYRINGE | INTRAVENOUS | Status: AC
  Administered 2024-03-18: 100 mg
  Filled 2024-03-18: qty 10

## 2024-03-18 MED ORDER — SODIUM CHLORIDE 0.9% IV SOLUTION
Freq: Once | INTRAVENOUS | Status: DC
Start: 2024-03-18 — End: 2024-03-19

## 2024-03-18 MED ORDER — ONDANSETRON HCL 4 MG/2ML IJ SOLN
4.0000 mg | Freq: Four times a day (QID) | INTRAMUSCULAR | Status: DC | PRN
Start: 2024-03-18 — End: 2024-03-19
  Filled 2024-03-18: qty 2

## 2024-03-18 MED ORDER — PHENYLEPHRINE 80 MCG/ML (10ML) SYRINGE FOR IV PUSH (FOR BLOOD PRESSURE SUPPORT)
PREFILLED_SYRINGE | INTRAVENOUS | Status: AC
  Filled 2024-03-18: qty 10

## 2024-03-18 MED ORDER — EPINEPHRINE HCL 5 MG/250ML IV SOLN IN NS
0.5000 ug/min | INTRAVENOUS | Status: DC
  Administered 2024-03-18: 0.5 ug/min via INTRAVENOUS
  Filled 2024-03-18: qty 250

## 2024-03-18 MED ORDER — SODIUM CHLORIDE 0.9 % IV SOLN: 250.0000 mL | INTRAVENOUS | Status: DC

## 2024-03-18 MED ORDER — PHENYLEPHRINE HCL-NACL 20-0.9 MG/250ML-% IV SOLN
INTRAVENOUS | Status: AC
  Filled 2024-03-18: qty 250

## 2024-03-18 MED ORDER — INSULIN ASPART 100 UNIT/ML IJ SOLN: 0.0000 [IU] | Freq: Every day | INTRAMUSCULAR | Status: DC

## 2024-03-18 MED ORDER — FINASTERIDE 5 MG PO TABS: 5.0000 mg | ORAL_TABLET | Freq: Every day | ORAL | Status: DC

## 2024-03-18 MED ORDER — ROCURONIUM BROMIDE 10 MG/ML (PF) SYRINGE
PREFILLED_SYRINGE | INTRAVENOUS | Status: DC | PRN
  Administered 2024-03-18: 70 mg via INTRAVENOUS

## 2024-03-18 MED ORDER — ONDANSETRON 4 MG PO TBDP
4.0000 mg | ORAL_TABLET | Freq: Four times a day (QID) | ORAL | Status: DC | PRN
Start: 2024-03-18 — End: 2024-03-19

## 2024-03-18 MED ORDER — NOREPINEPHRINE 4 MG/250ML-% IV SOLN
0.0000 ug/min | INTRAVENOUS | Status: DC
  Administered 2024-03-18: 25 ug/min via INTRAVENOUS
  Administered 2024-03-18: 40 ug/min via INTRAVENOUS
  Administered 2024-03-18: 25 ug/min via INTRAVENOUS
  Administered 2024-03-19 (×6): 40 ug/min via INTRAVENOUS
  Filled 2024-03-18 (×2): qty 250
  Filled 2024-03-18: qty 500
  Filled 2024-03-18 (×6): qty 250

## 2024-03-18 MED ORDER — ACETAMINOPHEN 650 MG RE SUPP: 650.0000 mg | Freq: Four times a day (QID) | RECTAL | Status: DC | PRN

## 2024-03-18 SURGICAL SUPPLY — 42 items
CHLORAPREP W/TINT 26 (MISCELLANEOUS) ×1 IMPLANT
COVER LIGHT HANDLE STERIS (MISCELLANEOUS) ×1 IMPLANT
COVER MAYO STAND XLG (MISCELLANEOUS) ×1 IMPLANT
COVER TIP SHEARS 8 DVNC (MISCELLANEOUS) ×1 IMPLANT
DERMABOND ADVANCED .7 DNX12 (GAUZE/BANDAGES/DRESSINGS) ×1 IMPLANT
DRAPE ARM DVNC X/XI (DISPOSABLE) ×3 IMPLANT
DRAPE COLUMN DVNC XI (DISPOSABLE) ×1 IMPLANT
DRIVER NDL MEGA SUTCUT DVNCXI (INSTRUMENTS) ×1 IMPLANT
DRIVER NDLE MEGA SUTCUT DVNCXI (INSTRUMENTS) ×1 IMPLANT
ELECTRODE REM PT RTRN 9FT ADLT (ELECTROSURGICAL) ×1 IMPLANT
FORCEPS BPLR R/ABLATION 8 DVNC (INSTRUMENTS) ×1 IMPLANT
GAUZE SPONGE 4X4 12PLY STRL (GAUZE/BANDAGES/DRESSINGS) ×1 IMPLANT
GAUZE XEROFORM 1X8 LF (GAUZE/BANDAGES/DRESSINGS) IMPLANT
GLOVE BIOGEL PI IND STRL 7.0 (GLOVE) ×4 IMPLANT
GLOVE SURG SS PI 7.5 STRL IVOR (GLOVE) ×2 IMPLANT
GOWN STRL REUS W/TWL LRG LVL3 (GOWN DISPOSABLE) ×2 IMPLANT
GRASPER TIP-UP FEN DVNC XI (INSTRUMENTS) IMPLANT
KIT PINK PAD W/HEAD ARM REST (MISCELLANEOUS) ×1 IMPLANT
KIT TURNOVER KIT A (KITS) ×1 IMPLANT
MANIFOLD NEPTUNE II (INSTRUMENTS) ×1 IMPLANT
MESH 3DMAX MID 5X7 RT XLRG (Mesh General) IMPLANT
NDL HYPO 21X1.5 SAFETY (NEEDLE) ×1 IMPLANT
NDL INSUFFLATION 14GA 120MM (NEEDLE) ×1 IMPLANT
NEEDLE HYPO 21X1.5 SAFETY (NEEDLE) ×1 IMPLANT
NEEDLE INSUFFLATION 14GA 120MM (NEEDLE) ×1 IMPLANT
OBTURATOR OPTICALSTD 8 DVNC (TROCAR) ×1 IMPLANT
PACK LAP CHOLE LZT030E (CUSTOM PROCEDURE TRAY) ×1 IMPLANT
PENCIL HANDSWITCHING (ELECTRODE) ×1 IMPLANT
POSITIONER HEAD 8X9X4 ADT (SOFTGOODS) ×1 IMPLANT
SCISSORS MNPLR CVD DVNC XI (INSTRUMENTS) ×1 IMPLANT
SEAL UNIV 5-12 XI (MISCELLANEOUS) ×3 IMPLANT
SET BASIN LINEN APH (SET/KITS/TRAYS/PACK) ×1 IMPLANT
SET TUBE DA VINCI INSUFFLATOR (TUBING) IMPLANT
SOL PREP POV-IOD 4OZ 10% (MISCELLANEOUS) ×1 IMPLANT
SUT MNCRL AB 4-0 PS2 18 (SUTURE) ×2 IMPLANT
SUT STRATA 3-0 SH (SUTURE) ×4 IMPLANT
SUT VIC AB 2-0 SH 27X BRD (SUTURE) ×1 IMPLANT
SYR 30ML LL (SYRINGE) ×1 IMPLANT
TAPE TRANSPORE STRL 2 31045 (GAUZE/BANDAGES/DRESSINGS) ×1 IMPLANT
TRAY FOL W/BAG SLVR 16FR STRL (SET/KITS/TRAYS/PACK) ×1 IMPLANT
TUBE CONNECTING 12X1/4 (SUCTIONS) IMPLANT
WATER STERILE IRR 500ML POUR (IV SOLUTION) ×1 IMPLANT

## 2024-03-18 SURGICAL SUPPLY — 33 items
CHLORAPREP W/TINT 26 (MISCELLANEOUS) ×1 IMPLANT
CLOTH BEACON ORANGE TIMEOUT ST (SAFETY) ×1 IMPLANT
COVER LIGHT HANDLE (MISCELLANEOUS) IMPLANT
DRAPE WARM FLUID 44X44 (DRAPES) ×1 IMPLANT
DRSG OPSITE POSTOP 4X8 (GAUZE/BANDAGES/DRESSINGS) IMPLANT
ELECTRODE REM PT RTRN 9FT ADLT (ELECTROSURGICAL) ×1 IMPLANT
GLOVE BIOGEL PI IND STRL 7.0 (GLOVE) ×2 IMPLANT
GLOVE SURG SS PI 7.5 STRL IVOR (GLOVE) ×1 IMPLANT
GOWN STRL REUS W/TWL LRG LVL3 (GOWN DISPOSABLE) ×3 IMPLANT
HANDLE SUCTION POOLE (INSTRUMENTS) IMPLANT
HEMOSTAT SURGICEL 4X8 (HEMOSTASIS) IMPLANT
INST SET MAJOR GENERAL (KITS) ×1 IMPLANT
KIT TURNOVER KIT A (KITS) ×1 IMPLANT
LIGASURE IMPACT 36 18CM CVD LR (INSTRUMENTS) ×1 IMPLANT
MANIFOLD NEPTUNE II (INSTRUMENTS) ×1 IMPLANT
NDL HYPO 21X1.5 SAFETY (NEEDLE) ×1 IMPLANT
NEEDLE HYPO 21X1.5 SAFETY (NEEDLE) ×1 IMPLANT
NS IRRIG 1000ML POUR BTL (IV SOLUTION) ×2 IMPLANT
PACK ABDOMINAL MAJOR (CUSTOM PROCEDURE TRAY) ×1 IMPLANT
PAD ARMBOARD POSITIONER FOAM (MISCELLANEOUS) ×1 IMPLANT
POSITIONER HEAD 8X9X4 ADT (SOFTGOODS) ×1 IMPLANT
RELOAD STAPLE 75 3.8 BLU REG (ENDOMECHANICALS) IMPLANT
RETRACTOR WND ALEXIS-O 25 LRG (MISCELLANEOUS) IMPLANT
SET BASIN LINEN APH (SET/KITS/TRAYS/PACK) ×1 IMPLANT
SPONGE SURGIFOAM ABS GEL 100 (HEMOSTASIS) IMPLANT
SPONGE T-LAP 18X18 ~~LOC~~+RFID (SPONGE) ×2 IMPLANT
STAPLER GUN LINEAR PROX 60 (STAPLE) IMPLANT
STAPLER PROXIMATE 75MM BLUE (STAPLE) IMPLANT
STAPLER VISISTAT (STAPLE) ×1 IMPLANT
SUT CHROMIC 2 0 SH (SUTURE) IMPLANT
SUT PDS AB 0 CTX 60 (SUTURE) ×2 IMPLANT
SUT SILK 3 0 SH CR/8 (SUTURE) IMPLANT
SYR 30ML LL (SYRINGE) ×2 IMPLANT

## 2024-03-18 NOTE — Anesthesia Preprocedure Evaluation (Signed)
 Anesthesia Evaluation  Patient identified by MRN, date of birth, ID band Patient awake    Reviewed: Allergy & Precautions, H&P , NPO status , Patient's Chart, lab work & pertinent test results, reviewed documented beta blocker date and time   Airway Mallampati: II  TM Distance: >3 FB Neck ROM: full    Dental no notable dental hx.    Pulmonary neg pulmonary ROS, former smoker   Pulmonary exam normal breath sounds clear to auscultation       Cardiovascular Exercise Tolerance: Good hypertension, + dysrhythmias + pacemaker  Rhythm:regular Rate:Normal     Neuro/Psych  Neuromuscular disease  negative psych ROS   GI/Hepatic negative GI ROS, Neg liver ROS,,,  Endo/Other  Hypothyroidism    Renal/GU negative Renal ROS  negative genitourinary   Musculoskeletal   Abdominal   Peds  Hematology negative hematology ROS (+)   Anesthesia Other Findings   Reproductive/Obstetrics negative OB ROS                              Anesthesia Physical Anesthesia Plan  ASA: 3  Anesthesia Plan: General and General ETT   Post-op Pain Management:    Induction:   PONV Risk Score and Plan: Ondansetron   Airway Management Planned:   Additional Equipment:   Intra-op Plan:   Post-operative Plan:   Informed Consent: I have reviewed the patients History and Physical, chart, labs and discussed the procedure including the risks, benefits and alternatives for the proposed anesthesia with the patient or authorized representative who has indicated his/her understanding and acceptance.     Dental Advisory Given  Plan Discussed with: CRNA  Anesthesia Plan Comments:         Anesthesia Quick Evaluation

## 2024-03-18 NOTE — Progress Notes (Signed)
 Patient developed persistent hypotension in PACU.  Patient was transferred to the ICU for further management and treatment.  Patient received multiple liters of lactated Ringer's.  His blood pressure continued to be low.  Levophed  was added.  Initial troponin was 5, white blood cell count 62,000 secondary to Decadron  condition, hemoglobin 8.9.  Chest x-ray was unremarkable.  A stat 2D echo revealed a normal ejection fraction with decreased right-sided filling pressures.  Oxygen  saturations remained above 98%.  Patient was verbalizing and only complained of some low back pain and minimal incisional pain.  No abdominal pain was noted.  Abdomen is soft, nontender, nondistended.  No bruising was noted.  Patient suddenly had a seizure and was postictal.  I had already discussed with the patient the suspension of his DNR due to to the perioperative state that he was in.  He agreed to undergo CPR should it be required.  Due to his seizure and postictal state, Dr. Kendell of anesthesia reintubated the patient.  A right femoral vein triple-lumen central line was placed.  The patient is being transported down for a stat CT scan of the head, abdomen and pelvis.  Wife and family have been updated.  Patient in critical condition.

## 2024-03-18 NOTE — Transfer of Care (Signed)
 Immediate Anesthesia Transfer of Care Note  Patient: Alexander Maynard  Procedure(s) Performed: LAPAROTOMY, EXPLORATORY COLECTOMY, PARTIAL  Patient Location: ICU  Anesthesia Type:General  Level of Consciousness: Patient remains intubated per anesthesia plan  Airway & Oxygen  Therapy: Patient remains intubated per anesthesia plan and Patient placed on Ventilator (see vital sign flow sheet for setting)  Post-op Assessment: Report given to RN and Post -op Vital signs reviewed and unstable, Anesthesiologist notified  Post vital signs: unstable  Last Vitals:  Vitals Value Taken Time  BP 106/65 03/18/24 22:30  Temp    Pulse 116 03/18/24 22:30  Resp 24 03/18/24 22:30  SpO2      Last Pain:  Vitals:   03/18/24 2032  TempSrc: Rectal  PainSc:       Patients Stated Pain Goal: 9 (03/18/24 0818)  Complications: No notable events documented.

## 2024-03-18 NOTE — Progress Notes (Signed)
  Update --Imaging studies are Now available as noted below  1)-CT head without contrast without acute findings--- altered mentation and posturing most likely due to persistent hypotension with cerebral hypoperfusion]  2)--- CT abdomen and pelvis with contrast shows--IMPRESSION: --. Large volume hemopneumoperitoneum. Acute 16 x 12 cm left upper/mid abdominal acute hematoma with active extravasation within the hematoma (bleed appears to arise from along the pancreatic body/tail). Colonic splenic flexure and greater curvature of the stomach are inseparable from the hematoma with associated colonic bowel wall thickening. Etiology of hemorrhage unclear. Question mesenteric hemorrhage. -- Large right inguinal hernia containing gas and blood products.   - Postoperative left-sided abdominal acute hematoma with active extravasation within the hematoma explains ongoing persistent hypotension and hemodynamic instability with acute on chronic symptomatic anemia requiring IV fluids, pressors, and transfusion of PRBC --General Surgery will take patient back to the OR exploratory laparotomy with hope of stopping the bleeding - --Further management per critical care and general surgery -  Rendall Carwin, MD

## 2024-03-18 NOTE — Addendum Note (Signed)
 Addendum  created 03/18/24 1523 by Para Jerelene CROME, CRNA   Flowsheet accepted, Intraprocedure Flowsheets edited

## 2024-03-18 NOTE — Progress Notes (Signed)
 BP 81/56 after 500cc bolus given, Dr. Rebecka in to see pt. Possible over night stay.

## 2024-03-18 NOTE — Progress Notes (Signed)
 Another 500 cc bolus given fpr hypotension..Will continue to monitor.

## 2024-03-18 NOTE — Progress Notes (Signed)
 Dr. Kendell Notified of lab results, critical WBC 62.1 and HgB 8.9.  dr. Rebecka to talk with dr. Mavis. No new orders

## 2024-03-18 NOTE — Progress Notes (Signed)
 CT scan of the abdomen pelvis shows evidence of a hematoma along the left side of the abdomen.  Final reading is pending.  Will take patient back to the operating room for exploratory laparotomy.  The risks and benefits of the procedure including bleeding, infection, cardiopulmonary difficulties, and death were fully explained to the patient's wife, who will give informed consent for the patient as the patient is intubated.

## 2024-03-18 NOTE — Anesthesia Procedure Notes (Signed)
 Procedure Name: Intubation Date/Time: 03/18/2024 10:26 AM  Performed by: Para Jerelene CROME, CRNAPre-anesthesia Checklist: Patient identified, Emergency Drugs available, Suction available and Patient being monitored Patient Re-evaluated:Patient Re-evaluated prior to induction Oxygen  Delivery Method: Circle system utilized Preoxygenation: Pre-oxygenation with 100% oxygen  Induction Type: IV induction Laryngoscope Size: Mac and 4 Grade View: Grade I Tube type: Oral Tube size: 7.0 mm Number of attempts: 1 Airway Equipment and Method: Stylet Placement Confirmation: ETT inserted through vocal cords under direct vision, positive ETCO2, CO2 detector and breath sounds checked- equal and bilateral Secured at: 24 cm Tube secured with: Tape Dental Injury: Teeth and Oropharynx as per pre-operative assessment  Comments: Atraumatic intubation performed by Alan Pac. [employer]er, [employer]. Patient edentulous. Lips remain in preoperative condition.

## 2024-03-18 NOTE — TOC CM/SW Note (Signed)
 Transition of Care [facility]) - Inpatient Brief Assessment   Patient Details  Name: Alexander Maynard MRN: [id number] Date of Birth: 29-Apr-1946  Transition of Care [facility]) CM/SW Contact:    Noreen KATHEE Pinal, LCSWA Phone Number: 03/18/2024, 3:39 PM   Clinical Narrative:  Transition of Care Department [facility]) has reviewed patient and no TOC needs have been identified at this time. We will continue to monitor patient advancement through interdisciplinary progression rounds. If new patient transition needs arise, please place a TOC consult.  Transition of Care Asessment: Insurance and Status: Insurance coverage has been reviewed Patient has primary care physician: Yes Home environment has been reviewed: Single Family Home Prior level of function:: Independent Prior/Current Home Services: No current home services Social Drivers of Health Review: SDOH reviewed no interventions necessary Readmission risk has been reviewed: Yes Transition of care needs: no transition of care needs at this time

## 2024-03-18 NOTE — Op Note (Signed)
 Patient:  AH BOTT  DOB:  [birth year]  MRN:  [id number]   Preop Diagnosis: Right inguinal hernia  Postop Diagnosis: Same  Procedure: Robotic assisted laparoscopic right inguinal herniorrhaphy with mesh  Surgeon: Oneil Budge, MD  Anes: General Endotracheal  Indications: Patient is a 78 year old white male who presents with a symptomatic right inguinal hernia.  The risks and benefits of the procedure including bleeding, infection, mesh use, the possibility of recurrence of the hernia were fully explained to the patient, who gave informed consent.  Procedure note: The patient was placed in the supine position.  After induction of general endotracheal anesthesia, the perineum and abdomen were prepped and draped using the usual sterile technique with Betadine  and ChloraPrep.  Surgical site confirmation was performed.  An incision was made in the left upper quadrant at Palmer's point.  A Veress needle was introduced into the abdominal cavity and confirmation of placement was done using the saline drop test.  The abdomen was then insufflated to 15 mmHg pressure.  An 8 mm trocar was introduced into the abdominal cavity under direct visualization without difficulty.  The bowel was inspected and no injury was noted.  Additional 8 mm trocars were placed in the upper midline and right upper quadrant regions.  The patient was placed in Trendelenburg position.  The robot was then docked and targeted.  The patient had a large indirect right inguinal hernia with small bowel present.  This was reduced.  A peritoneal flap was then formed lateral to medial.  This was taken down to Cooper's ligament.  The dissection was then taken down several centimeters below Cooper's ligament.  This was carried out laterally.  The indirect hernia sac was then freed away from the spermatic cord.  A 7 cm posterior dissection was performed.  An extra-large Bard 3D max mesh was then inserted and secured to Cooper's ligament  using a 2-0 Vicryl interrupted suture.  Another tacking the 2-0 Vicryl suture was placed superior to the indirect hernia.  The flap was then closed using a 3-0 Stratafix running suture.  Air was then evacuated from that space and good approximation of the peritoneum and mesh were noted to the abdominal wall.  The robot was undocked and all air was evacuated from the abdominal cavity prior to the removal of the trocars.  All wounds were irrigated with normal saline.  All wounds were checked with 0.5% Sensorcaine .  The upper midline fascia as well as left upper quadrant fascia were reapproximated using 0 Vicryl interrupted sutures.  All skin incisions were closed using a 4-0 Monocryl subcuticular suture.  Dermabond was applied.  All tape and needle counts were correct at the end of the procedure.  The patient was extubated in the operating room and transferred to PACU in stable condition.  Complications: None  EBL: Minimal  Specimen: None

## 2024-03-18 NOTE — Progress Notes (Signed)
 BP low 72/48. Dr. Rebecka notified and in to assess pt. Stated to give 500 cc bolus and monitor.  Bolus given.

## 2024-03-18 NOTE — Progress Notes (Signed)
 Lab called with critical result of WBC 95.7, Hgb 6.4 Dr. Mavis made aware

## 2024-03-18 NOTE — Progress Notes (Signed)
 Lab called with ABG pH of 7.11 Jenkins MD at bedside and aware. Primary RN also made aware

## 2024-03-18 NOTE — Progress Notes (Signed)
 1756: Patient given 100mg  succ 1757: ET 21 at the lip. Color change noted.  1800: OG 50cm.

## 2024-03-18 NOTE — Anesthesia Postprocedure Evaluation (Signed)
 Anesthesia Post Note  Patient: Alexander Maynard  Procedure(s) Performed: REPAIR, HERNIA, INGUINAL, ROBOT-ASSISTED, LAPAROSCOPIC, USING MESH (Right: Inguinal)  Patient location during evaluation: Phase II Anesthesia Type: General Level of consciousness: awake Pain management: pain level controlled Vital Signs Assessment: post-procedure vital signs reviewed and stable Respiratory status: spontaneous breathing and respiratory function stable Cardiovascular status: blood pressure returned to baseline and stable Postop Assessment: no headache and no apparent nausea or vomiting Anesthetic complications: no Comments: Late entry   No notable events documented.   Last Vitals:  Vitals:   03/18/24 0818 03/18/24 1200  BP: (!) 123/59 113/72  Pulse: 61   Resp: 20   Temp: 36.7 C   SpO2: 100%     Last Pain:  Vitals:   03/18/24 0818  TempSrc: Oral  PainSc: 0-No pain                 Yvonna JINNY Bosworth

## 2024-03-18 NOTE — Consult Note (Addendum)
 Initial Consultation Note   Patient: Alexander Maynard FMW:[id number] DOB: [birth year] PCP: Toribio Jerel MATSU, MD DOA: 03/18/2024 DOS: the patient was seen and examined on 03/18/2024 Primary service: Mavis Anes, MD  Referring physician: Dr. Mavis Reason for consult: Persistent hypotension  Assessment/Plan: Assessment and Plan: 1)Persistent Hypotension---in the postoperative patient--suspect  Postoperative bleeding --Persistent hypotension requiring IV Levophed  and vasopressin  for pressure support--Despite V fluids due to postoperative bleeding with acute on chronic symptomatic anemia and hypovolemic/hemorrhagic shock -Now intubated and sedated -Echo from 03/18/2024 with IVC is small suggesting low RA pressure and hypovolemia--- otherwise echo is unremarkable with preserved EF suspect the persistent hypotension is due to postoperative blood loss -- Continue IV fluids, transfused 3 units of PRBC - Patient is now intubated and sedated - Continue IV Levophed  and vasopressin  try to keep MAP above 65 - CT abdomen and pelvis with contrast pending  2)Patient is status post robotic assisted laparoscopic right inguinal herniorrhaphy with mesh earlier on 03/13/2023 with concerns for postoperative bleed -- Postoperative management per general surgery team  3) acute on chronic anemia--- suspect due to postoperative bleed --HGb 8.9 >>6.4 (baseline chronic anemia with hemoglobin usually between 10 and 11) --Platelets 751 >>767 k -- CT abdomen and pelvis to rule out intra-abdominal bleed pending - Transfuse treatment for PRBC as above #1  4)AKI----acute kidney injury with anion gap metabolic acidosis--due to hypovolemia/hypotension -Bicarb 13, AG 18 Creatinine 1.26 >> 1.49 (baseline 0.86 >> 1.0) -Continue IV fluids and pressors for pressure support -Bicarb drip started - renally adjust medications, avoid nephrotoxic agents / dehydration  / hypotension  5)Social/Ethics---patient was previously  DNR/DNI---given concerns for postoperative complication patient daughter-in-law agreed to intubation and sedation given concerns about inability to protect airway with altered mentation.  Per general surgeon Dr. Mavis patient previously agreed also to lift his DNR order should he develop postoperative complication -Anesthesiologist at bedside patient intubated -- 6)Leukocytosis---WBC 62.1 >>95.7 (patient received Decadron  earlier)-- --suspect reactive due to surgery, postoperative bleed and Decadron  injection -- Doubt sepsis or frank infection at this time - Hold off on antibiotics  7)chronic A-fib, history of CHB-- status post pacemaker placement, chronic anticoagulation Xarelto has been on  hold for couple of weeks -- Continue to hold anticoagulation due to ongoing concerns about postoperative bleed  9) BPH with LUTS--avoid alpha blockers at this time, Foley in situ  10)history of polycythemia on hydroxyurea --hold  hydroxyurea  - Monitor CBC  11) acute hypoxic respiratory failure--- due to postoperative bleed-- -patient is now intubated and sedated ABG post-intubation with pH of 7.11 pCO2 of 27 pO2 of 204 bicarb of 8.6 -- Acid base deficit of 19.1 - PCCM consulted for vent management  12) acute metabolic encephalopathy---When I arrived patient persistently hypotensive with a blood pressure in the 70s----despite IV Levophed  up to 10 mics via peripheral line hypotension persisted--- while evaluating patient with patient's daughter-in-law at bedside noted that patient had change in mentation --- initially patient was able to answer my questions , was alert and oriented x 3 and appropriate --- subsequently in the setting of persistent hypotension he became nonverbal , he was posturing especially within right upper extremity -- --suspect due to persistent hypotension with cerebral hypoperfusion - CT head without contrast pending  13) elevated troponin---Troponin 5 >> 22--suspect due to  hypotension / demand ischemia - EKG ventricularly paced rhythm - Echo from 03/15/2024 without wall motion abnormalities with preserved EF noted  14) hyperglycemia----Glucose 210 >>331 (after decadron ) - Steroids and stress-induced Use Novolog /Humalog Sliding scale insulin   with Accu-Cheks/Fingersticks as ordered   --Protonix  for GI prophylaxis, taken SCDs for DVT prophylaxis in the patient with postoperative bleeding  CRITICAL CARE Performed by: Rendall Carwin   Total critical care time: 64 minutes  Critical care time was exclusive of separately billable procedures and treating other patients.  Critical care was necessary to treat or prevent imminent or life-threatening deterioration. - Persistent hypotension requiring IV Levophed  and vasopressin  for pressure support--Despite V fluids due to postoperative bleeding with acute on chronic symptomatic anemia and hypovolemic/hemorrhagic shock  -Now intubated and sedated  Critical care was time spent personally by me on the following activities: development of treatment plan with patient and/or surrogate as well as nursing, discussions with consultants, evaluation of patient's response to treatment, examination of patient, obtaining history from patient or surrogate, ordering and performing treatments and interventions, ordering and review of laboratory studies, ordering and review of radiographic studies, pulse oximetry and re-evaluation of patient's condition.   [facility] will continue to follow the patient.  HPI: Alexander Maynard is a 78 y.o. male reformed smoker with with past medical history chronic A-fib, history of CHB-- status post pacemaker placement, chronic anticoagulation Xarelto has been on  hold for couple of weeks, BPH with LUTS, HTN, HLD, hypothyroidism, history of polycythemia on hydroxyurea  who underwent robotic assisted laparoscopic right inguinal herniorrhaphy with mesh earlier today--- he required Dilaudid  and subsequently  developed hypotension after being extubated - Additional history obtained from patient's general surgeon anesthesiologist who attended the case earlier today--patient apparently developed hypotension, received over 3.5 L of IV fluid remained persistently hypotensive, also received Decadron .SABRA JASMINE [facility] yesterday service was consulted due to persistent hypotension in the postoperative patient -- When I arrived patient persistently hypotensive with a blood pressure in the 70s----despite IV Levophed  up to 10 mics via peripheral line hypotension persisted--- while evaluating patient with patient's daughter-in-law at bedside noted that patient had change in mentation --- initially patient was able to answer my questions , was alert and oriented x 3 and appropriate --- subsequently in the setting of persistent hypotension he became nonverbal , he was posturing especially within right upper extremity ---patient was previously DNR/DNI---given concerns for postoperative complication patient daughter-in-law agreed to intubation and sedation given concerns about inability to protect airway with altered mentation.  Per general surgeon Dr. Mavis patient previously agreed also to lift his DNR order should he develop postoperative complication -Anesthesiologist at bedside patient intubated --general surgeon placed right femoral central line to allow for higher dose Levophed  infusion---  -- Labs and imaging studies ordered--- -high-dose Levophed  and vasopressin  initiated with some improvement in BP- -- ABG post-intubation with pH of 7.11 pCO2 of 27 pO2 of 204 bicarb of 8.6 -- Acid base deficit of 19.1 -WBC 62.1 >>95.7 (patient received Decadron  earlier) HGb 8.9 >>6.4 (baseline chronic anemia with hemoglobin usually between 10 and 11) --Platelets 751 >>767 k Troponin 5>>22 - EKG ventricularly paced rhythm -Glucose 210 >>331 (after decadron ) Bicarb 13, AG 18 Creatinine 1.26 >> 1.49 (baseline 0.86 >> 1.0) -- Echo  with EF of 65 to 70% no regional wall motion maladies, normal pulmonary artery systolic pressure, no mitral stenosis, no aortic stenosis, IVC is small suggesting low RA pressure and hypovolemia, moderate LVH noted, no pericardial effusion -- CT head without contrast, CT abdomen and pelvis with contrast pending - Post intubation chest x-ray pending  No fevers  Review of Systems: As mentioned in the history of present illness. All other systems reviewed and are negative. Past  Medical History:  Diagnosis Date   Atrial fibrillation (HCC)    Dysrhythmia    Enlarged prostate without lower urinary tract symptoms (luts)    Essential thrombocythemia (HCC)    Hyperlipidemia    Hypothyroidism    Male erectile disorder    Other specified polyneuropathies    Polycythemia, secondary    Presence of permanent cardiac pacemaker    Restless leg syndrome    Venous thrombosis    There is a mention of this in his notes.  He is on chronic anticoagulation.   Past Surgical History:  Procedure Laterality Date   AMPUTATION OF SECOND TOE     AMPUTATION TOE Right 10/05/2023   Procedure: AMPUTATION, TOE, 1st toe partial;  Surgeon: Malvin Marsa FALCON, DPM;  Location: [facility] OR;  Service: Orthopedics/Podiatry;  Laterality: Right;   ATRIAL FIBRILLATION ABLATION N/A 10/26/2022   Procedure: ATRIAL FIBRILLATION ABLATION;  Surgeon: Inocencio Soyla Lunger, MD;  Location: [facility] INVASIVE CV LAB;  Service: Cardiovascular;  Laterality: N/A;   CATARACT EXTRACTION Left 09/2020   CATARACT EXTRACTION W/PHACO Left 10/08/2020   Procedure: CATARACT EXTRACTION PHACO AND INTRAOCULAR LENS PLACEMENT LEFT EYE;  Surgeon: Harrie Agent, MD;  Location: [facility] ORS;  Service: Ophthalmology;  Laterality: Left;  CDE 23.63   CATARACT EXTRACTION W/PHACO Right 10/22/2020   Procedure: CATARACT EXTRACTION PHACO AND INTRAOCULAR LENS PLACEMENT (IOC);  Surgeon: Harrie Agent, MD;  Location: [facility] ORS;  Service: Ophthalmology;  Laterality: Right;  CDE: 16.83     COLONOSCOPY  2009   INGUINAL HERNIA REPAIR  1998   LARYNGEAL POLYP     METATARSAL HEAD EXCISION Right 10/05/2023   Procedure: EXCISION, METATARSAL BONE, HEAD, 2nd;  Surgeon: Malvin Marsa FALCON, DPM;  Location: [facility] OR;  Service: Orthopedics/Podiatry;  Laterality: Right;   TONSILLECTOMY     TRANSURETHRAL RESECTION OF PROSTATE  2016   UPPER GASTROINTESTINAL ENDOSCOPY     Social History:  reports that he has quit smoking. His smoking use included cigarettes. He has never used smokeless tobacco. He reports that he does not drink alcohol  and does not use drugs.  No Known Allergies  Family History  Problem Relation Age of Onset   Pneumonia Mother    Heart attack Father 33   Prostate cancer Brother    Chronic Renal Failure Brother     Prior to Admission medications   Medication Sig Start Date End Date Taking? Authorizing Provider  diclofenac Sodium (VOLTAREN) 1 % GEL Apply 4 g topically 4 (four) times daily as needed (for pain).   Yes [provider]  finasteride  (PROSCAR ) 5 MG tablet Take 5 mg by mouth daily.   Yes [provider]  gabapentin  (NEURONTIN ) 300 MG capsule Take 300 mg by mouth 2 (two) times daily.   Yes [provider]  hydroxyurea  (HYDREA ) 500 MG capsule Take 1,000 mg by mouth daily. May take with food to minimize GI side effects.   Yes [provider]  levothyroxine  (SYNTHROID ) 125 MCG tablet Take 125 mcg by mouth daily before breakfast.   Yes [provider]  Multiple Vitamin (MULTIVITAMIN WITH MINERALS) TABS tablet Take 1 tablet by mouth daily.   Yes [provider]  omeprazole (PRILOSEC) 20 MG capsule Take 20 mg by mouth daily.   Yes [provider]  tamsulosin  (FLOMAX ) 0.4 MG CAPS capsule Take 0.4 mg by mouth in the morning and at bedtime.   Yes [provider]  traMADol  (ULTRAM ) 50 MG tablet Take 1 tablet (50 mg total) by mouth every 6 (  six) hours as needed. 03/18/24  Yes Mavis Anes, MD   rivaroxaban (XARELTO) 20 MG TABS tablet Take 20 mg by mouth every morning.    [provider]    Physical Exam: Vitals:   03/18/24 1700 03/18/24 1705 03/18/24 1800 03/18/24 1830  BP: (!) 78/53 (!) 74/46  (!) 86/38  Pulse: (!) 119     Resp: (!) 28 (!) 24  (!) 23  Temp:      TempSrc:      SpO2:  100% 100%   Weight: 95.2 kg     Height:   5' 11 (1.803 m)     Physical Exam  Gen:-Now intubated and sedated HEENT:- [place].AT, No sclera icterus, ET tube and OG tube noted Neck-No JVD,.  Lungs-  CTAB , fair air movement bilaterally, no wheezing CV- S1, S2 normal, RRR, pacemaker in situ Abd-  +ve B.Sounds, Abd Soft, postoperative wound intact and hemostatic Extremity/Skin:- No  edema,   good pedal pulses, right groin femoral catheter site NeuroPsych--now intubated and sedated, had altered mentation and became nonverbal with posturing prior to intubation GU-Foley in situ with scant amount of urine  Data Reviewed:  -- Labs and imaging studies ordered--- -high-dose Levophed  and vasopressin  initiated with some improvement in BP- -- ABG post-intubation with pH of 7.11 pCO2 of 27 pO2 of 204 bicarb of 8.6 -- Acid base deficit of 19.1 -WBC 62.1 >>95.7 (patient received Decadron  earlier) HGb 8.9 >>6.4 (baseline chronic anemia with hemoglobin usually between 10 and 11) --Platelets 751 >>767 k Troponin 22 >> 5 - EKG ventricularly paced rhythm -Glucose 210 >>331 (after decadron ) Bicarb 13, AG 18 Creatinine 1.26 >> 1.49 (baseline 0.86 >> 1.0) -- Echo with EF of 65 to 70% no regional wall motion maladies, normal pulmonary artery systolic pressure, no mitral stenosis, no aortic stenosis, IVC is small suggesting low RA pressure and hypovolemia, moderate LVH noted, no pericardial effusion -- CT head without contrast, CT abdomen and pelvis with contrast pending - Post intubation chest x-ray pending  Family Communication: Discussed with daughter-in-law at bedside Primary team communication:  Discussed with general surgeon and anesthesiologist at bedside Thank you very much for involving us  in the care of your patient.  Author: Rendall Carwin, MD 03/18/2024 7:09 PM  For on call review [email or web address].

## 2024-03-18 NOTE — Progress Notes (Signed)
 Patient continues to be in critical condition.  I did update the wife and family that he is in critical condition and his prognosis is poor.  I did update his CODE STATUS to no CPR.  This was done in conjunction with his wishes and the family wishes.

## 2024-03-18 NOTE — Progress Notes (Signed)
 eLink Physician-Brief Progress Note Patient Name: Alexander Maynard DOB: [birth year] MRN: [id number]   Date of Service  03/18/2024  HPI/Events of Note  Patient with hemorrhagic shock.  eICU Interventions  PRBC hanging was opened up and 2 additional units requested stat from the blood bank, crystalloid iv fluids and 500 ml of 5 % albumin  also infused stat, stat H & H ordered along with ABG, 200 mcg of Epinephrine  was pushed iv for a systolic BP of 40 with improvement in the BP to 129/105, attending surgeon and anesthesiologist arrived in the room and took over resuscitation.        Yarethzi Branan U Gloriajean Okun 03/18/2024, 8:20 PM

## 2024-03-18 NOTE — Interval H&P Note (Signed)
 History and Physical Interval Note:  03/18/2024 9:32 AM  Alexander Maynard  has presented today for surgery, with the diagnosis of Inguinal hernia, right.  The various methods of treatment have been discussed with the patient and family. After consideration of risks, benefits and other options for treatment, the patient has consented to  Procedure(s): REPAIR, HERNIA, INGUINAL, ROBOT-ASSISTED, LAPAROSCOPIC, USING MESH (Right) as a surgical intervention.  The patient's history has been reviewed, patient examined, no change in status, stable for surgery.  I have reviewed the patient's chart and labs.  Questions were answered to the patient's satisfaction.     Oneil Budge

## 2024-03-18 NOTE — Transfer of Care (Signed)
 Immediate Anesthesia Transfer of Care Note  Patient: Alexander Maynard  Procedure(s) Performed: REPAIR, HERNIA, INGUINAL, ROBOT-ASSISTED, LAPAROSCOPIC, USING MESH (Right: Inguinal)  Patient Location: PACU  Anesthesia Type:General  Level of Consciousness: drowsy and patient cooperative  Airway & Oxygen  Therapy: Patient Spontanous Breathing and Patient connected to face mask oxygen   Post-op Assessment: Report given to RN and Post -op Vital signs reviewed and stable  Post vital signs: Reviewed and stable  Last Vitals:  Vitals Value Taken Time  BP 113/72 03/18/24 12:01  Temp    Pulse 102 03/18/24 12:03  Resp 26 03/18/24 12:05  SpO2 94 % 03/18/24 12:03  Vitals shown include unfiled device data.  Last Pain:  Vitals:   03/18/24 0818  TempSrc: Oral  PainSc: 0-No pain      Patients Stated Pain Goal: 9 (03/18/24 0818)  Complications: No notable events documented.

## 2024-03-18 NOTE — Op Note (Signed)
 Patient:  Alexander Maynard  DOB:  02/12/1946  MRN:  [id number]   Preop Diagnosis: Postoperative intra-abdominal hemorrhage  Postop Diagnosis: Same  Procedure: Exploratory laparotomy, partial colectomy with bowel left and incontinuity  Surgeon: Oneil Budge, MD  Anes: General endotracheal  Indications: Patient is a 78 year old white male who earlier today underwent an uneventful robotic assisted laparoscopic right inguinal herniorrhaphy with mesh.  In the PACU, he started having some hypotension.  He was given 2 L of fluid and his hemoglobin came back at 8.9 with his preoperative hemoglobin at 10.3.  It was elected to transfer him to the ICU for further monitoring.  His blood pressure continued to be low and thus he was started on pressor support.  A CT scan of the abdomen pelvis revealed a large hematoma in the mesentery of the splenic flexure with minimal amount of fluid along the left paracolic gutter and spleen.  His repeat hemoglobin did drop to 6.4.  In addition, he had a a seizure episode probably secondary to hypotension and was intubated in the ICU.  The patient is being brought back to the operating room for an exploratory laparotomy.  The risks and benefits of the procedure were fully explained to the patient's wife, who gave informed consent.  Procedure note: The patient was transferred from the ICU directly to the operating room.  The patient did receive 2 g of Ancef  IV.  The abdomen was prepped and draped using usual sterile technique with ChloraPrep.  Surgical site confirmation was performed.  The midline incision was made from just below the xiphoid process to the umbilicus.  The peritoneal cavity was entered into without difficulty.  The small bowel was then inspected and no bowel injury was noted.  A large hematoma was noted extending from the distal transverse colon and splenic flexure towards the spleen.  There was also blood noted around the greater omentum extending up towards  the greater curvature of the stomach.  I was able to evacuate the hematoma manually.  Approximately 1 L of hematoma was found.  On inspecting the distal transverse colon and splenic flexure, the mesentery was noted to be compromised and the bowel was starting to look ischemic.  I elected to proceed with a partial bowel resection.  A GIA 75 stapler was placed across the distal transverse colon and fired.  This was likewise done along the proximal descending colon.  I then carefully used the LigaSure to divide the mesentery.  The colon was then removed from the operative field.  I then explored the left upper quadrant to identify any further bleeding.  The hematoma did extend up to the spleen in the tail of the pancreas.  The spleen was intact and no bleeding was noted from that.  There was some oozing from the tail of the pancreas.  I became concerned that the patient may be going into DIC.  I packed the left upper quadrant with Surgicel and Gelfoam.  After applying pressure for some time, I looked back in this area and no significant bleeding was noted.  The bowel was returned into the abdominal cavity in an orderly fashion.  The fascia was reapproximated using a looped 0 Novafil running suture.  The skin was closed with staples.  A dry sterile dressing was then applied.  All tape and needle counts were correct at the end of the procedure.  The patient was transferred back to the ICU intubated in critical condition.  Complications: None  EBL: 1 L  Specimen: Splenic flexure of colon

## 2024-03-18 NOTE — Anesthesia Preprocedure Evaluation (Signed)
 Anesthesia Evaluation  Patient identified by MRN, date of birth, ID band Patient awake    Reviewed: Allergy & Precautions, H&P , NPO status , Patient's Chart, lab work & pertinent test results, reviewed documented beta blocker date and time   Airway Mallampati: II  TM Distance: >3 FB Neck ROM: full    Dental no notable dental hx.    Pulmonary neg pulmonary ROS, former smoker   Pulmonary exam normal breath sounds clear to auscultation       Cardiovascular Exercise Tolerance: Good hypertension, negative cardio ROS + dysrhythmias + pacemaker  Rhythm:regular Rate:Normal     Neuro/Psych  Neuromuscular disease negative neurological ROS  negative psych ROS   GI/Hepatic negative GI ROS, Neg liver ROS,,,  Endo/Other  negative endocrine ROSHypothyroidism    Renal/GU negative Renal ROS  negative genitourinary   Musculoskeletal   Abdominal   Peds  Hematology negative hematology ROS (+)   Anesthesia Other Findings   Reproductive/Obstetrics negative OB ROS                              Anesthesia Physical Anesthesia Plan  ASA: 4 and emergent  Anesthesia Plan: General ETT   Post-op Pain Management:    Induction: Inhalational  PONV Risk Score and Plan:   Airway Management Planned: Oral ETT  Additional Equipment: Arterial line  Intra-op Plan:   Post-operative Plan: Possible Post-op intubation/ventilation  Informed Consent: I have reviewed the patients History and Physical, chart, labs and discussed the procedure including the risks, benefits and alternatives for the proposed anesthesia with the patient or authorized representative who has indicated his/her understanding and acceptance.    Discussed DNR with patient and Suspend DNR.   Dental Advisory Given  Plan Discussed with: CRNA  Anesthesia Plan Comments: (Pt on 4 pressors, critically ill.   I previously intubated pt and placed  a-line. Plan minimal sedation. Runs of v-tach prior to leaving ICU.)        Anesthesia Quick Evaluation

## 2024-03-19 ENCOUNTER — Encounter (HOSPITAL_COMMUNITY): Payer: Self-pay | Admitting: General Surgery

## 2024-03-19 DIAGNOSIS — Z9889 Other specified postprocedural states: Secondary | ICD-10-CM

## 2024-03-19 DIAGNOSIS — N179 Acute kidney failure, unspecified: Secondary | ICD-10-CM | POA: Diagnosis present

## 2024-03-19 DIAGNOSIS — T380X5A Adverse effect of glucocorticoids and synthetic analogues, initial encounter: Secondary | ICD-10-CM | POA: Diagnosis present

## 2024-03-19 DIAGNOSIS — R578 Other shock: Secondary | ICD-10-CM | POA: Diagnosis present

## 2024-03-19 DIAGNOSIS — Y92239 Unspecified place in hospital as the place of occurrence of the external cause: Secondary | ICD-10-CM | POA: Diagnosis present

## 2024-03-19 DIAGNOSIS — Z8719 Personal history of other diseases of the digestive system: Secondary | ICD-10-CM

## 2024-03-19 DIAGNOSIS — E785 Hyperlipidemia, unspecified: Secondary | ICD-10-CM | POA: Diagnosis present

## 2024-03-19 DIAGNOSIS — Z79890 Hormone replacement therapy (postmenopausal): Secondary | ICD-10-CM | POA: Diagnosis not present

## 2024-03-19 DIAGNOSIS — Y838 Other surgical procedures as the cause of abnormal reaction of the patient, or of later complication, without mention of misadventure at the time of the procedure: Secondary | ICD-10-CM | POA: Diagnosis present

## 2024-03-19 DIAGNOSIS — Z87891 Personal history of nicotine dependence: Secondary | ICD-10-CM | POA: Diagnosis not present

## 2024-03-19 DIAGNOSIS — K4090 Unilateral inguinal hernia, without obstruction or gangrene, not specified as recurrent: Secondary | ICD-10-CM | POA: Diagnosis present

## 2024-03-19 DIAGNOSIS — R571 Hypovolemic shock: Secondary | ICD-10-CM

## 2024-03-19 DIAGNOSIS — Z515 Encounter for palliative care: Secondary | ICD-10-CM | POA: Diagnosis not present

## 2024-03-19 DIAGNOSIS — E872 Acidosis, unspecified: Secondary | ICD-10-CM | POA: Diagnosis present

## 2024-03-19 DIAGNOSIS — E039 Hypothyroidism, unspecified: Secondary | ICD-10-CM | POA: Diagnosis present

## 2024-03-19 DIAGNOSIS — G2581 Restless legs syndrome: Secondary | ICD-10-CM | POA: Diagnosis present

## 2024-03-19 DIAGNOSIS — I480 Paroxysmal atrial fibrillation: Secondary | ICD-10-CM

## 2024-03-19 DIAGNOSIS — D65 Disseminated intravascular coagulation [defibrination syndrome]: Secondary | ICD-10-CM | POA: Diagnosis present

## 2024-03-19 DIAGNOSIS — K559 Vascular disorder of intestine, unspecified: Secondary | ICD-10-CM

## 2024-03-19 DIAGNOSIS — Z950 Presence of cardiac pacemaker: Secondary | ICD-10-CM | POA: Diagnosis not present

## 2024-03-19 DIAGNOSIS — N138 Other obstructive and reflux uropathy: Secondary | ICD-10-CM | POA: Diagnosis present

## 2024-03-19 DIAGNOSIS — G9341 Metabolic encephalopathy: Secondary | ICD-10-CM | POA: Diagnosis present

## 2024-03-19 DIAGNOSIS — I10 Essential (primary) hypertension: Secondary | ICD-10-CM | POA: Diagnosis present

## 2024-03-19 DIAGNOSIS — Z66 Do not resuscitate: Secondary | ICD-10-CM | POA: Diagnosis present

## 2024-03-19 DIAGNOSIS — Z7901 Long term (current) use of anticoagulants: Secondary | ICD-10-CM | POA: Diagnosis not present

## 2024-03-19 DIAGNOSIS — Z89421 Acquired absence of other right toe(s): Secondary | ICD-10-CM | POA: Diagnosis not present

## 2024-03-19 DIAGNOSIS — R569 Unspecified convulsions: Secondary | ICD-10-CM | POA: Diagnosis present

## 2024-03-19 DIAGNOSIS — Z8249 Family history of ischemic heart disease and other diseases of the circulatory system: Secondary | ICD-10-CM | POA: Diagnosis not present

## 2024-03-19 DIAGNOSIS — K91840 Postprocedural hemorrhage and hematoma of a digestive system organ or structure following a digestive system procedure: Secondary | ICD-10-CM | POA: Diagnosis present

## 2024-03-19 DIAGNOSIS — J9601 Acute respiratory failure with hypoxia: Secondary | ICD-10-CM | POA: Diagnosis present

## 2024-03-19 LAB — DIC (DISSEMINATED INTRAVASCULAR COAGULATION)PANEL
D-Dimer, Quant: 2.05 ug{FEU}/mL — ABNORMAL HIGH (ref 0.00–0.50)
D-Dimer, Quant: 5.28 ug{FEU}/mL — ABNORMAL HIGH (ref 0.00–0.50)
D-Dimer, Quant: 7.63 ug{FEU}/mL — ABNORMAL HIGH (ref 0.00–0.50)
D-Dimer, Quant: 8.11 ug{FEU}/mL — ABNORMAL HIGH (ref 0.00–0.50)
D-Dimer, Quant: 8.64 ug{FEU}/mL — ABNORMAL HIGH (ref 0.00–0.50)
Fibrinogen: 60 mg/dL — CL (ref 210–475)
Fibrinogen: 60 mg/dL — CL (ref 210–475)
Fibrinogen: 60 mg/dL — CL (ref 210–475)
Fibrinogen: 60 mg/dL — CL (ref 210–475)
Fibrinogen: 64 mg/dL — CL (ref 210–475)
INR: 5.4 (ref 0.8–1.2)
INR: 7.3 (ref 0.8–1.2)
INR: 8.1 (ref 0.8–1.2)
INR: 3.3 — ABNORMAL HIGH (ref 0.8–1.2)
INR: 6.6 (ref 0.8–1.2)
Platelets: 331 K/uL (ref 150–400)
Platelets: 222 K/uL (ref 150–400)
Platelets: 225 K/uL (ref 150–400)
Platelets: 203 K/uL (ref 150–400)
Platelets: 215 K/uL (ref 150–400)
Prothrombin Time: 51.3 s — ABNORMAL HIGH (ref 11.4–15.2)
Prothrombin Time: 65.4 s — ABNORMAL HIGH (ref 11.4–15.2)
Prothrombin Time: 71 s — ABNORMAL HIGH (ref 11.4–15.2)
Prothrombin Time: 35 s — ABNORMAL HIGH (ref 11.4–15.2)
Prothrombin Time: 60.5 s — ABNORMAL HIGH (ref 11.4–15.2)
Smear Review: NONE SEEN
Smear Review: NONE SEEN
Smear Review: NONE SEEN
Smear Review: NONE SEEN
Smear Review: NONE SEEN
aPTT: 107 s — ABNORMAL HIGH (ref 24–36)
aPTT: 194 s (ref 24–36)
aPTT: 197 s (ref 24–36)
aPTT: 155 s — ABNORMAL HIGH (ref 24–36)
aPTT: 75 s — ABNORMAL HIGH (ref 24–36)

## 2024-03-19 LAB — COMPREHENSIVE METABOLIC PANEL WITH GFR
ALT: 34 U/L (ref 0–44)
ALT: 126 U/L — ABNORMAL HIGH (ref 0–44)
AST: 69 U/L — ABNORMAL HIGH (ref 15–41)
AST: 187 U/L — ABNORMAL HIGH (ref 15–41)
Albumin: 3.8 g/dL (ref 3.5–5.0)
Albumin: 2.4 g/dL — ABNORMAL LOW (ref 3.5–5.0)
Alkaline Phosphatase: 67 U/L (ref 38–126)
Alkaline Phosphatase: 60 U/L (ref 38–126)
Anion gap: 20 — ABNORMAL HIGH (ref 5–15)
Anion gap: 21 — ABNORMAL HIGH (ref 5–15)
BUN: 18 mg/dL (ref 8–23)
BUN: 19 mg/dL (ref 8–23)
CO2: 15 mmol/L — ABNORMAL LOW (ref 22–32)
CO2: 15 mmol/L — ABNORMAL LOW (ref 22–32)
Calcium: 7.4 mg/dL — ABNORMAL LOW (ref 8.9–10.3)
Calcium: 6.7 mg/dL — ABNORMAL LOW (ref 8.9–10.3)
Chloride: 101 mmol/L (ref 98–111)
Chloride: 101 mmol/L (ref 98–111)
Creatinine, Ser: 1.61 mg/dL — ABNORMAL HIGH (ref 0.61–1.24)
Creatinine, Ser: 1.78 mg/dL — ABNORMAL HIGH (ref 0.61–1.24)
GFR, Estimated: 43 mL/min — ABNORMAL LOW (ref >60)
GFR, Estimated: 38 mL/min — ABNORMAL LOW (ref >60)
Glucose, Bld: 304 mg/dL — ABNORMAL HIGH (ref 70–99)
Glucose, Bld: 345 mg/dL — ABNORMAL HIGH (ref 70–99)
Potassium: 4.1 mmol/L (ref 3.5–5.1)
Potassium: 4 mmol/L (ref 3.5–5.1)
Sodium: 136 mmol/L (ref 135–145)
Sodium: 137 mmol/L (ref 135–145)
Total Bilirubin: 0.8 mg/dL (ref 0.0–1.2)
Total Bilirubin: 1.1 mg/dL (ref 0.0–1.2)
Total Protein: 5 g/dL — ABNORMAL LOW (ref 6.5–8.1)
Total Protein: 3.7 g/dL — ABNORMAL LOW (ref 6.5–8.1)

## 2024-03-19 LAB — GLUCOSE, CAPILLARY
Glucose-Capillary: 261 mg/dL — ABNORMAL HIGH (ref 70–99)
Glucose-Capillary: 242 mg/dL — ABNORMAL HIGH (ref 70–99)

## 2024-03-19 LAB — BLOOD GAS, ARTERIAL
Acid-base deficit: 16.7 mmol/L — ABNORMAL HIGH (ref 0.0–2.0)
Acid-base deficit: 20.3 mmol/L — ABNORMAL HIGH (ref 0.0–2.0)
Bicarbonate: 12.1 mmol/L — ABNORMAL LOW (ref 20.0–28.0)
Bicarbonate: 9.5 mmol/L — ABNORMAL LOW (ref 20.0–28.0)
Drawn by: 22223
Drawn by: 22223
O2 Saturation: 99 %
O2 Saturation: 99.2 %
Patient temperature: 35.1
Patient temperature: 35.4
pCO2 arterial: 33 mmHg (ref 32–48)
pCO2 arterial: 35 mmHg (ref 32–48)
pH, Arterial: 7.06 — CL (ref 7.35–7.45)
pH, Arterial: 7.13 — CL (ref 7.35–7.45)
pO2, Arterial: 133 mmHg — ABNORMAL HIGH (ref 83–108)
pO2, Arterial: 89 mmHg (ref 83–108)

## 2024-03-19 LAB — CBC
HCT: 18 % — ABNORMAL LOW (ref 39.0–52.0)
HCT: 27.1 % — ABNORMAL LOW (ref 39.0–52.0)
Hemoglobin: 5.6 g/dL — CL (ref 13.0–17.0)
Hemoglobin: 8.5 g/dL — ABNORMAL LOW (ref 13.0–17.0)
MCH: 29.6 pg (ref 26.0–34.0)
MCH: 28.9 pg (ref 26.0–34.0)
MCHC: 31.1 g/dL (ref 30.0–36.0)
MCHC: 31.4 g/dL (ref 30.0–36.0)
MCV: 95.2 fL (ref 80.0–100.0)
MCV: 92.2 fL (ref 80.0–100.0)
Platelets: 234 K/uL (ref 150–400)
Platelets: 199 K/uL (ref 150–400)
RBC: 1.89 MIL/uL — ABNORMAL LOW (ref 4.22–5.81)
RBC: 2.94 MIL/uL — ABNORMAL LOW (ref 4.22–5.81)
RDW: 15.5 % (ref 11.5–15.5)
RDW: 16.1 % — ABNORMAL HIGH (ref 11.5–15.5)
WBC: 68 K/uL (ref 4.0–10.5)
WBC: 80.4 K/uL (ref 4.0–10.5)
nRBC: 10.8 % — ABNORMAL HIGH (ref 0.0–0.2)
nRBC: 7.7 % — ABNORMAL HIGH (ref 0.0–0.2)

## 2024-03-19 LAB — LACTIC ACID, PLASMA: Lactic Acid, Venous: >9 mmol/L (ref 0.5–1.9)

## 2024-03-19 LAB — HEMOGLOBIN AND HEMATOCRIT, BLOOD
HCT: 23.7 % — ABNORMAL LOW (ref 39.0–52.0)
HCT: 27.1 % — ABNORMAL LOW (ref 39.0–52.0)
Hemoglobin: 7.5 g/dL — ABNORMAL LOW (ref 13.0–17.0)
Hemoglobin: 8.8 g/dL — ABNORMAL LOW (ref 13.0–17.0)

## 2024-03-19 LAB — TROPONIN I (HIGH SENSITIVITY): Troponin I (High Sensitivity): 332 ng/L (ref <18)

## 2024-03-19 LAB — TRIGLYCERIDES: Triglycerides: 86 mg/dL (ref <150)

## 2024-03-19 LAB — PREPARE RBC (CROSSMATCH)

## 2024-03-19 LAB — MAGNESIUM: Magnesium: 1.7 mg/dL (ref 1.7–2.4)

## 2024-03-19 LAB — PHOSPHORUS: Phosphorus: 7.8 mg/dL — ABNORMAL HIGH (ref 2.5–4.6)

## 2024-03-19 LAB — MASSIVE TRANSFUSION PROTOCOL ORDER (BLOOD BANK NOTIFICATION)

## 2024-03-19 LAB — AMYLASE: Amylase: 69 U/L (ref 28–100)

## 2024-03-19 MED ORDER — ALBUMIN HUMAN 25 % IV SOLN
50.0000 g | Freq: Once | INTRAVENOUS | Status: AC
  Administered 2024-03-19: 12.5 g via INTRAVENOUS
  Filled 2024-03-19: qty 200

## 2024-03-19 MED ORDER — EPINEPHRINE 0.1 MG/10ML (10 MCG/ML) SYRINGE FOR IV PUSH (FOR BLOOD PRESSURE SUPPORT)
200.0000 ug | PREFILLED_SYRINGE | Freq: Once | INTRAVENOUS | Status: AC
  Administered 2024-03-19: 200 ug via INTRAVENOUS

## 2024-03-19 MED ORDER — SODIUM CHLORIDE 0.9% IV SOLUTION: Freq: Once | INTRAVENOUS | Status: DC

## 2024-03-19 MED ORDER — VITAMIN K1 10 MG/ML IJ SOLN
10.0000 mg | INTRAVENOUS | Status: AC
  Administered 2024-03-19: 10 mg via INTRAVENOUS
  Filled 2024-03-19: qty 1

## 2024-03-19 MED ORDER — VITAMIN K1 10 MG/ML IJ SOLN
10.0000 mg | Freq: Once | INTRAVENOUS | Status: DC
  Filled 2024-03-19: qty 1

## 2024-03-19 MED ORDER — PROTHROMBIN COMPLEX CONC HUMAN 500 UNITS IV KIT
2126.0000 [IU] | PACK | Status: AC
  Administered 2024-03-19: 2126 [IU] via INTRAVENOUS
  Filled 2024-03-19: qty 2126

## 2024-03-19 MED ORDER — VANCOMYCIN HCL 1500 MG/300ML IV SOLN: 1500.0000 mg | INTRAVENOUS | Status: DC

## 2024-03-19 MED ORDER — VANCOMYCIN HCL 2000 MG/400ML IV SOLN
2000.0000 mg | Freq: Once | INTRAVENOUS | Status: AC
  Administered 2024-03-19: 2000 mg via INTRAVENOUS
  Filled 2024-03-19: qty 400

## 2024-03-19 MED ORDER — PIPERACILLIN-TAZOBACTAM 3.375 G IVPB
3.3750 g | Freq: Three times a day (TID) | INTRAVENOUS | Status: DC
  Administered 2024-03-19: 3.375 g via INTRAVENOUS
  Filled 2024-03-19: qty 50

## 2024-03-19 MED ORDER — VANCOMYCIN HCL IN DEXTROSE 1-5 GM/200ML-% IV SOLN: 1000.0000 mg | INTRAVENOUS | Status: DC

## 2024-03-19 MED ORDER — EPINEPHRINE 1 MG/10ML IJ SOSY
PREFILLED_SYRINGE | INTRAMUSCULAR | Status: AC
  Filled 2024-03-19: qty 10

## 2024-03-19 MED ORDER — SODIUM CHLORIDE 0.9 % IV SOLN
100.0000 mg | INTRAVENOUS | Status: DC
  Administered 2024-03-19: 100 mg via INTRAVENOUS
  Filled 2024-03-19 (×2): qty 5

## 2024-03-19 MED ORDER — SODIUM BICARBONATE 8.4 % IV SOLN
100.0000 meq | Freq: Once | INTRAVENOUS | Status: AC
  Administered 2024-03-19: 100 meq via INTRAVENOUS

## 2024-03-19 MED ORDER — CHLORHEXIDINE GLUCONATE CLOTH 2 % EX PADS: 6.0000 | MEDICATED_PAD | Freq: Every day | CUTANEOUS | Status: DC

## 2024-03-20 LAB — PREPARE CRYOPRECIPITATE
Unit division: 0
Unit division: 0

## 2024-03-20 LAB — BPAM FFP
Blood Product Expiration Date: 202508280224
Blood Product Expiration Date: 202508280225
Blood Product Expiration Date: 202508280227
Blood Product Expiration Date: 202508280228
Blood Product Expiration Date: 202509012359
ISSUE DATE / TIME: 202508270232
ISSUE DATE / TIME: 202508270232
ISSUE DATE / TIME: 202508270232
ISSUE DATE / TIME: 202508270232
Unit Type and Rh: 8400
Unit Type and Rh: 8400
Unit Type and Rh: 8400
Unit Type and Rh: 8400
Unit Type and Rh: 2800

## 2024-03-20 LAB — PREPARE FRESH FROZEN PLASMA
Unit division: 0
Unit division: 0
Unit division: 0
Unit division: 0
Unit division: 0

## 2024-03-20 LAB — BPAM CRYOPRECIPITATE
Blood Product Expiration Date: 202508270912
Blood Product Expiration Date: 202508271050
ISSUE DATE / TIME: 202508270401
ISSUE DATE / TIME: 202508270452
Unit Type and Rh: 5100
Unit Type and Rh: 6200

## 2024-03-20 LAB — SURGICAL PATHOLOGY

## 2024-03-20 LAB — HEMOGLOBIN A1C
Hgb A1c MFr Bld: 5.1 % (ref 4.8–5.6)
Mean Plasma Glucose: 100 mg/dL

## 2024-03-21 LAB — BPAM RBC
Blood Product Expiration Date: 202509112359
Blood Product Expiration Date: 202509112359
Blood Product Expiration Date: 202509112359
Blood Product Expiration Date: 202509112359
Blood Product Expiration Date: 202509152359
Blood Product Expiration Date: 202509152359
Blood Product Expiration Date: 202509152359
Blood Product Expiration Date: 202509182359
Blood Product Expiration Date: 202509182359
Blood Product Expiration Date: 202509192359
Blood Product Expiration Date: 202509222359
Blood Product Expiration Date: 202509242359
Blood Product Expiration Date: 202509292359
Blood Product Expiration Date: 202509292359
Blood Product Expiration Date: 202509292359
Blood Product Unit Number: 202509152359
Blood Product Unit Number: 202509152359
ISSUE DATE / TIME: 202508261934
ISSUE DATE / TIME: 202508262021
ISSUE DATE / TIME: 202508262022
ISSUE DATE / TIME: 202508262156
ISSUE DATE / TIME: 202508262349
ISSUE DATE / TIME: 202508270111
ISSUE DATE / TIME: 202508270112
ISSUE DATE / TIME: 202508270114
ISSUE DATE / TIME: 202508270207
ISSUE DATE / TIME: 202508270417
ISSUE DATE / TIME: 202508270419
ISSUE DATE / TIME: 202508270421
ISSUE DATE / TIME: 202508270456
ISSUE DATE / TIME: 202508270459
ISSUE DATE / TIME: 202508281441
PRODUCT CODE: 202508281207
PRODUCT CODE: 202509152359
PRODUCT CODE: 202509152359
PRODUCT CODE: 202509192359
Unit Type and Rh: 1700
Unit Type and Rh: 202509152359
Unit Type and Rh: 202509152359
Unit Type and Rh: 600
Unit Type and Rh: 600
Unit Type and Rh: 600
Unit Type and Rh: 600
Unit Type and Rh: 6200
Unit Type and Rh: 6200
Unit Type and Rh: 6200
Unit Type and Rh: 6200
Unit Type and Rh: 6200
Unit Type and Rh: 6200
Unit Type and Rh: 6200
Unit Type and Rh: 6200
Unit Type and Rh: 6200
Unit Type and Rh: 6200
Unit Type and Rh: 6200
Unit Type and Rh: 9500
Unit Type and Rh: 9500
Unit Type and Rh: 9500
Unit Type and Rh: 9500

## 2024-03-21 LAB — TYPE AND SCREEN
ABO/RH(D): AB NEG
Antibody Screen: NEGATIVE
Unit division: 0
Unit division: 0
Unit division: 0
Unit division: 0
Unit division: 0
Unit division: 0
Unit division: 0
Unit division: 0
Unit division: 0
Unit division: 0
Unit division: 0
Unit division: 0
Unit division: 0
Unit division: 0
Unit division: 0
Unit division: 0
Unit division: 0
Unit division: 0

## 2024-03-22 LAB — PREPARE FRESH FROZEN PLASMA

## 2024-03-22 LAB — BPAM FFP
Blood Product Expiration Date: 202509012359
ISSUE DATE / TIME: 202508270300
Unit Type and Rh: 8400

## 2024-03-24 NOTE — Progress Notes (Signed)
 eLink Physician-Brief Progress Note Patient Name: Alexander Maynard DOB: 06/24/1946 MRN: [id number]   Date of Service  04/09/24  HPI/Events of Note  INR 8.1, PTT 194.  eICU Interventions  KCENTRA  ordered per protocol.        Joannie Medine U Luanna Weesner 09-Apr-2024, 3:41 AM

## 2024-03-24 NOTE — Progress Notes (Signed)
 eLink Physician-Brief Progress Note Patient Name: Alexander Maynard DOB: [birth year] MRN: [id number]   Date of Service  04-03-24  HPI/Events of Note  ABG result noted.  eICU Interventions  Two amps of Bicarb ordered iv push. Patient is on a Bicarb gtt at 125 ml / hour.        Elaynah Virginia U Terry Bolotin 2024/04/03, 1:37 AM

## 2024-03-24 NOTE — Progress Notes (Signed)
 Spoke with MD Mavis at this time do not administer FFP. Wife of pt on the way. Continue with current regimen of max pressor support and to notify MD at time of arrival of family.

## 2024-03-24 NOTE — Progress Notes (Signed)
 AB Positive FFP given.  Unit number is [id number]  Expiration Date  01/16/2025  Second verified by Eva Ferraris, RN  Bertina Roark PEAK

## 2024-03-24 NOTE — Progress Notes (Signed)
 PRBC Unit Number [id number]  A POS  Expiration 04/21/2024 at 2359  Verified by Bertina Solum RN and Eva Ferraris RN

## 2024-03-24 NOTE — Progress Notes (Signed)
 I spoke with Alexander Maynard with [facility] to determine if pt would be a ME case and per their recommendations pt is not a ME case. Dr. Mavis notified.

## 2024-03-24 NOTE — Progress Notes (Signed)
 Pharmacy Antibiotic Note  Alexander Maynard is a 78 y.o. male admitted on 03/18/2024 with sepsis.  Pharmacy has been consulted for Vancomycin  dosing. WBC is markedly elevated. Renal function is worsening (1.26>1.49>1.61), lactic acid is markedly elevated. S/P abdominal surgery this AM, had to later return for hematoma/ischemia.   Plan: Vancomycin  2000 mg IV x 1, further dosing per Scr trend Zosyn  3.375G IV q8h to be infused over 4 hours Micafungin  per MD Trend WBC, temp, renal function  F/U infectious work-up Drug levels as indicated   Height: 5' 11 (180.3 cm) Weight: 95.2 kg (209 lb 14.1 oz) IBW/kg (Calculated) : 75.3  Temp (24hrs), Avg:96.2 F (35.7 C), Min:95.2 F (35.1 C), Max:98 F (36.7 C)  Recent Labs  Lab 03/18/24 1503 03/18/24 1757 03/18/24 2312 April 13, 2024 0116  WBC 62.1* 95.7* 94.0*  --   CREATININE 1.26* 1.49* 1.61*  --   LATICACIDVEN  --   --  >9.0* >9.0*    Estimated Creatinine Clearance: 43.8 mL/min (A) (by C-G formula based on SCr of 1.61 mg/dL (H)).    No Known Allergies  Lynwood Mckusick, PharmD, BCPS Clinical Pharmacist Phone: [phone]

## 2024-03-24 NOTE — Progress Notes (Signed)
 Date and time results received: 04/15/2024 0315   Test: 2nd DIC panel  Critical Value: PTT 1.94   INR 8.1       Fibrinogen 60 and hgb 7.5   Name of Provider Notified: Elink team via Claretta Sharps  Orders Received? Or Actions Taken?:  MD aware. See active orders.  Kellogg RN

## 2024-03-24 NOTE — Progress Notes (Signed)
 9062 order received for comfort care. Discussed with MD Mavis. At this point ok to proceed turning all medications and support off.   1010 TOD confirmed by myself and Administrator, sports.  family at bedside. MD Mavis made aware.

## 2024-03-24 NOTE — Progress Notes (Signed)
 Contacted by nurse secondary to ongoing hypotension despite max dose of pressors support.  Patient's hypotension suspected to ongoing hypovolemic shock.  Per chart review elevated INR significant prolongation of PTT and decreased fibrinogen appreciated.  Orders placed for epinephrine  bolus, albumin  infusion and fresh frozen plasma transfusion to be provided.  Overall prognosis significantly poor and primary team planning to discuss with patient's family members regarding further course of action; most likely comfort care.  Plan: -IV epinephrine  infusion -continue max pressor support using levophed , neo and vasopressin  -IV albumin  ordered -will also provide FFP transfusion. -per recommendations by E-Link ok to discontinue amiodarone . -continue ventilatory support and supportive therapy. -follow advance care planning and further GOC discussion/intervention per primary team.   Eric Nunnery MD [phone]

## 2024-03-24 NOTE — Progress Notes (Signed)
 Date and time results received: 2024/03/22  0240  Test: DIC Pan Critical Value: First DIC panel came back. PT is 51.3   INR 5.4   Fibrinogen 60    CBC hgb is 5.6   Name of Provider Notified: Elink team via Claretta Sharps  Orders Received? Or Actions Taken?:   MD aware.   Kellogg RN

## 2024-03-24 NOTE — Progress Notes (Signed)
 PR[facility] Unit Number [id number] I AB-Neg Exp 04/21/2024  Verified by Eva Ferraris RN and Laymon Solum RN

## 2024-03-24 NOTE — Progress Notes (Signed)
 Around 0720 this AM page to MD [facility] regarding pt BP. Pt remains hypotensive despite max dose of pressor support, requesting additional pressor.   Around 0740 No response at this time. Elink nurse paged. Amio stopped due to hypotension and pt HR remaining in 60s. Per Elink no provider avaliable at this time. [facility] Melinda notified and requested to call MD Mavis to bedside for orders. Per [facility] MD in OR , will come to bedside shortly.   Awaiting orders at time time.

## 2024-03-24 NOTE — Anesthesia Postprocedure Evaluation (Signed)
 Anesthesia Post Note  Patient: Alexander Maynard  Procedure(s) Performed: LAPAROTOMY, EXPLORATORY (Abdomen) COLECTOMY, PARTIAL (Abdomen)  Patient unfortunately is deceased at the time of this post-op note.               Yvonna JINNY Bosworth

## 2024-03-24 NOTE — Progress Notes (Signed)
 PRBC Unit number [id number] A POS  Expires 04/21/2024 at 2359 . Verified by Bertina Solum and Eva Ferraris

## 2024-03-24 NOTE — Progress Notes (Signed)
 Date and time results received: 04/03/24 2340   Test: Lactic Critical Value: > 9  Name of Provider Notified: Dr Mavis  Orders Received? Or Actions Taken?:  MD aware and primary RN informed.  Kellogg RN

## 2024-03-24 NOTE — Progress Notes (Signed)
 3 units of FFP administered  Unit number [id number] AB pos.   Expires 03/20/2024  2. Unit Number  [id number]  AB- POS Expires 03/20/2024  3. Unit Number [id number] Expires 03/20/2024   Verified by Bertina Timonthy Hovater RN and Eva Ferraris RN

## 2024-03-24 NOTE — Progress Notes (Signed)
 Discussed with patient's wife and family at bedside.  Patient's prognosis is grave and it was elected to proceed with comfort care only.  All questions were answered.

## 2024-03-24 NOTE — Progress Notes (Signed)
 eLink Physician-Brief Progress Note Patient Name: Alexander Maynard DOB: [birth year] MRN: [id number]   Date of Service  25-Mar-2024  HPI/Events of Note  Patient back from the ICU with persistent hemorrhagic shock due to large intra-abdominal hematoma.  eICU Interventions  Ongoing resuscitation with PRBC + FFP, will check DIC panel and add Fibrinogen if patient is hypofibrinogenemic, ABG to screen for metabolic acidosis contributing to hypotension, Blood Bank informed to work on additional units of PRBC and FFP in reserve. Pressors titrated to maintain MAP > 65.        Kaydin Karbowski U Jolynn Bajorek 2024-03-25, 1:15 AM

## 2024-03-24 NOTE — Progress Notes (Signed)
 PRBC unit number [id number] AB Neg Exp 04/07/2024  Verified by Eva Ferraris, RN and Laymon Solum RN

## 2024-03-24 NOTE — Death Summary Note (Signed)
 DEATH SUMMARY   Patient Details  Name: Alexander Maynard MRN: [id number] DOB: 1945/08/31  Admission/Discharge Information   Admit Date:  13-Apr-2024  Date of Death:  04/14/24  Time of Death:  10:10am  Length of Stay: 0  Referring Physician: Toribio Jerel MATSU, MD   Reason(s) for Hospitalization  Hypotension and intra-abdominal hemorrhage, status post robotic assisted laparoscopic right inguinal herniorrhaphy with mesh  Diagnoses  Preliminary cause of death:  Secondary Diagnoses (including complications and co-morbidities):  Principal Problem:   S/P laparoscopic hernia repair Active Problems:   BPH with obstruction/lower urinary tract symptoms   Paroxysmal atrial fibrillation (HCC)   Atrial fibrillation (HCC)   Polycythemia vera (HCC)   Hypothyroidism   Pacemaker   Right inguinal hernia   Ischemic bowel disease [facility])   Brief Hospital Course (including significant findings, care, treatment, and services provided and events leading to death)  Alexander Maynard is a 78 y.o. year old male who presented as an outpatient for a robotic assisted laparoscopic right inguinal herniorrhaphy with mesh.  Intraoperatively, he tolerated the surgery well.  In the recovery room, he was noted to be alert and oriented but right continued to have low blood pressures despite multiple fluid boluses.  Multiple labs were drawn which were significant for a significantly elevated white blood cell count but a hemoglobin that had only drifted down slightly and was felt to be secondary to hemodilution.  A 2D echo was urgently performed which showed poor filling pressures but normal ejection fraction.  He was transferred to the ICU and repeat blood work showed a more significant drop in his hemoglobin.  CT scan of the abdomen and pelvis was performed which revealed a mesenteric hematoma in the left upper quadrant.  At this point, he was on significant pressor support and was receiving multiple units of packed red blood  cells.  Intraoperatively, he was found to have a hematoma in the mesentery of the splenic flexure.  Due to compromise of the bowel, a partial resection of the splenic flexure was performed, but was left in discontinuity.  Patient was transferred back to the intensive care unit in critical condition.  The patient developed DIC and despite multiple blood products and IV pressors, the patient continued to deteriorate.  In discussion with the patient's wife and family, he was made comfort care only on the morning of Apr 14, 2024.  He expired at 10:10 AM on Apr 14, 2024.  The medical examiner was contacted but did not feel this warrant his examination.    Pertinent Labs and Studies  Significant Diagnostic Studies CT HEAD WO CONTRAST ( ) Result Date: 04/13/2024 CLINICAL DATA:  Initial evaluation for acute neuro deficit, stroke suspected. EXAM: CT HEAD WITHOUT CONTRAST TECHNIQUE: Contiguous axial images were obtained from the base of the skull through the vertex without intravenous contrast. RADIATION DOSE REDUCTION: This exam was performed according to the departmental dose-optimization program which includes automated exposure control, adjustment of the mA and/or kV according to patient size and/or use of iterative reconstruction technique. COMPARISON:  None Available. FINDINGS: Brain: Cerebral volume within normal limits. No acute intracranial hemorrhage. No acute large vessel territory infarct. No mass lesion or midline shift. No hydrocephalus or extra-axial fluid collection. Vascular: No abnormal hyperdense vessel. Scattered vascular calcifications noted within the carotid siphons. Skull: Scalp soft tissues demonstrate no acute finding. Calvarium intact. Sinuses/Orbits: Globes orbital soft tissues within normal limits. Visualized paranasal sinuses and mastoid air cells are clear. Other: None. IMPRESSION: Normal head CT for age.  No acute  intracranial abnormality. Electronically Signed   By: Morene Hoard  M.D.   On: 03/18/2024 20:33   DG Chest Port 1 View Result Date: 03/18/2024 CLINICAL DATA:  [id number] Encounter for intubation [id number] EXAM: PORTABLE CHEST 1 VIEW COMPARISON:  Chest x-ray 03/18/2024 4:09 p.m. FINDINGS: Patient is rotated. Interval placement of an endotracheal tube with tip terminating 5 cm above the carina. Enteric tube courses below the hemidiaphragm in appropriate position. Left chest wall dual lead pacemaker. The heart and mediastinal contours are unchanged given patient rotation. No focal consolidation. No pulmonary edema. Trace right pleural effusion. No pneumothorax. No acute osseous abnormality. IMPRESSION: 1. Trace right pleural effusion. 2.  Aortic Atherosclerosis (ICD10-I70.0). Electronically Signed   By: Morgane  Naveau M.D.   On: 03/18/2024 20:28   CT ABDOMEN PELVIS W CONTRAST Result Date: 03/18/2024 CLINICAL DATA:  OR procedure today RT inguinal hernia - low blood pressure. Seizure. EXAM: CT ABDOMEN AND PELVIS WITH CONTRAST TECHNIQUE: Multidetector CT imaging of the abdomen and pelvis was performed using the standard protocol following bolus administration of intravenous contrast. RADIATION DOSE REDUCTION: This exam was performed according to the departmental dose-optimization program which includes automated exposure control, adjustment of the mA and/or kV according to patient size and/or use of iterative reconstruction technique. CONTRAST:  OMNIPAQUE  IOHEXOL  300 MG/ML  SOLN COMPARISON:  None Available. FINDINGS: Lower chest: No acute abnormality.  Small hiatal hernia. Hepatobiliary: No focal liver abnormality. No gallstones, gallbladder wall thickening, or pericholecystic fluid. No biliary dilatation. Pancreas: Diffusely atrophic. No focal lesion. Otherwise normal pancreatic contour. No surrounding inflammatory changes. No main pancreatic ductal dilatation. Spleen: Splenomegaly measuring up to 15 cm. Adrenals/Urinary Tract: No adrenal nodule bilaterally. Bilateral kidneys  enhance symmetrically. No hydronephrosis. No hydroureter. The urinary bladder is decompressed with Foley catheter within the urinary bladder lumen. Stomach/Bowel: Enteric tube in appropriate position. Stomach is within normal limits. No evidence of small bowel wall thickening or dilatation. Bowel thickening of the splenic flexure. Colonic diverticulosis. Appendix appears normal. Vascular/Lymphatic: Right femoral vein approach catheter with tip in the right common iliac vein. Question splenic artery aneurysm measuring 1 cm (4:59). No abdominal aorta or iliac aneurysm. Moderate to severe atherosclerotic plaque of the aorta and its branches. No abdominal, pelvic, or inguinal lymphadenopathy. Reproductive: Prostate is unremarkable. Large right inguinal hernia containing gas and blood products. Other: Large volume hemopneumoperitoneum. Left abdominal heterogeneous acute hematoma measuring 16 x 12 cm. Active extravasation noted on delayed view within the hematoma (2:34-36; 6:41). Hematoma appears to be inseparable from the splenic flexure as well as greater curvature of the stomach. No abscess. Musculoskeletal: No suspicious lytic or blastic osseous lesions. No acute displaced fracture. IMPRESSION: 1. Large volume hemopneumoperitoneum. Acute 16 x 12 cm left upper/mid abdominal acute hematoma with active extravasation within the hematoma (bleed appears to arise from along the pancreatic body/tail). Colonic splenic flexure and greater curvature of the stomach are inseparable from the hematoma with associated colonic bowel wall thickening. Etiology of hemorrhage unclear. Question mesenteric hemorrhage. 2. Large right inguinal hernia containing gas and blood products. 3. Splenomegaly. 4. Colonic diverticulosis with no acute diverticulitis. 5. Small hiatal hernia. 6. Question splenic artery aneurysm measuring 1 cm. Recommend attention on follow-up. 7.  Aortic Atherosclerosis (ICD10-I70.0). These results were called by  telephone at the time of interpretation on 03/18/2024 at 8:20 pm to provider [facility] , who verbally acknowledged these results. Electronically Signed   By: Morgane  Naveau M.D.   On: 03/18/2024 20:27   DG Chest [facility] [address]  Result Date: 03/18/2024 CLINICAL DATA:  10026 Shortness of breath 10026 EXAM: PORTABLE CHEST 1 VIEW COMPARISON:  Chest x-ray 10/02/2023, CT cardiac 10/19/2022 FINDINGS: Left chest wall 2 lead pacemaker. The heart and mediastinal contours are unchanged. Low lung volumes. Bibasilar atelectasis. No focal consolidation. Interval increased and coarsened interstitial markings. No pleural effusion. No pneumothorax. No acute osseous abnormality. IMPRESSION: 1. Low lung volumes with bibasilar atelectasis. 2. Interval increased and coarsened interstitial markings. Question viral bronchiolitis versus reactive airway disease. Electronically Signed   By: Morgane  Naveau M.D.   On: 03/18/2024 20:05   ECHOCARDIOGRAM COMPLETE Result Date: 03/18/2024    ECHOCARDIOGRAM REPORT   Patient Name:   BALTHAZAR DOOLY Date of Exam: 03/18/2024 Medical Rec #:  [id number]      Height:       71.0 in Accession #:    [id number]     Weight:       197.2 lb Date of Birth:  03-27-1946       BSA:          2.096 m Patient Age:    78 years       BP:           84/46 mmHg Patient Gender: M              HR:           104 bpm. Exam Location:  Inpatient Procedure: 2D Echo, Cardiac Doppler and Color Doppler (Both Spectral and Color            Flow Doppler were utilized during procedure). Indications:    Hypotension  History:        Patient has no prior history of Echocardiogram examinations.                 Pacemaker; Arrythmias:Atrial Fibrillation.  Sonographer:    Therisa Crouch Referring Phys: [phone] Galilea Q[place]o IMPRESSIONS  1. Left ventricular ejection fraction, by estimation, is 65 to 70%. The left ventricle has normal function. The left ventricle has no regional wall motion abnormalities. There is moderate left ventricular  hypertrophy. Left ventricular diastolic parameters are indeterminate.  2. Right ventricular systolic function is normal. The right ventricular size is normal. There is normal pulmonary artery systolic pressure.  3. The mitral valve is normal in structure. No evidence of mitral valve regurgitation. No evidence of mitral stenosis.  4. The tricuspid valve is abnormal.  5. The aortic valve was not well visualized. Aortic valve regurgitation is mild to moderate. No aortic stenosis is present.  6. Aortic dilatation noted. There is mild dilatation of the aortic root, measuring 40 mm. There is mild dilatation of the ascending aorta, measuring 36 mm.  7. IVC is small suggesting low RA pressure and hypovolemia. FINDINGS  Left Ventricle: Left ventricular ejection fraction, by estimation, is 65 to 70%. The left ventricle has normal function. The left ventricle has no regional wall motion abnormalities. The left ventricular internal cavity size was normal in size. There is  moderate left ventricular hypertrophy. Left ventricular diastolic parameters are indeterminate. Right Ventricle: The right ventricular size is normal. Right vetricular wall thickness was not well visualized. Right ventricular systolic function is normal. There is normal pulmonary artery systolic pressure. The tricuspid regurgitant velocity is 2.69 m/s, and with an assumed right atrial pressure of 3 mmHg, the estimated right ventricular systolic pressure is 31.9 mmHg. Left Atrium: Left atrial size was normal in size. Right Atrium: Right atrial size was normal in size. Pericardium: There is  no evidence of pericardial effusion. Mitral Valve: The mitral valve is normal in structure. No evidence of mitral valve regurgitation. No evidence of mitral valve stenosis. Tricuspid Valve: The tricuspid valve is abnormal. Tricuspid valve regurgitation is mild . No evidence of tricuspid stenosis. Aortic Valve: The aortic valve was not well visualized. Aortic valve  regurgitation is mild to moderate. No aortic stenosis is present. Aortic valve mean gradient measures 5.3 mmHg. Aortic valve peak gradient measures 12.5 mmHg. Aortic valve area, by VTI measures 2.54 cm. Pulmonic Valve: The pulmonic valve was not well visualized. Pulmonic valve regurgitation is not visualized. No evidence of pulmonic stenosis. Aorta: Aortic dilatation noted. There is mild dilatation of the aortic root, measuring 40 mm. There is mild dilatation of the ascending aorta, measuring 36 mm. Venous: IVC is small suggesting low RA pressure and hypovolemia. IAS/Shunts: The interatrial septum was not well visualized.  LEFT VENTRICLE PLAX 2D LVIDd:         4.60 cm LVIDs:         3.20 cm LV PW:         1.30 cm LV IVS:        1.30 cm LVOT diam:     2.20 cm LV SV:         60 LV SV Index:   29 LVOT Area:     3.80 cm  LV Volumes (MOD) LV vol d, MOD A2C: 36.7 ml LV vol d, MOD A4C: 63.0 ml LV vol s, MOD A2C: 14.5 ml LV vol s, MOD A4C: 23.9 ml LV SV MOD A2C:     22.2 ml LV SV MOD A4C:     63.0 ml LV SV MOD BP:      29.7 ml IVC IVC diam: 1.20 cm LEFT ATRIUM           Index LA diam:      4.00 cm 1.91 cm/m LA Vol (A2C): 31.1 ml 14.84 ml/m LA Vol (A4C): 42.8 ml 20.42 ml/m  AORTIC VALVE AV Area (Vmax):    2.45 cm AV Area (Vmean):   2.50 cm AV Area (VTI):     2.54 cm AV Vmax:           176.46 cm/s AV Vmean:          103.777 cm/s AV VTI:            0.238 m AV Peak Grad:      12.5 mmHg AV Mean Grad:      5.3 mmHg LVOT Vmax:         113.72 cm/s LVOT Vmean:        68.356 cm/s LVOT VTI:          0.159 m LVOT/AV VTI ratio: 0.67  AORTA Ao Root diam: 4.00 cm Ao Asc diam:  3.60 cm TRICUSPID VALVE TR Peak grad:   28.9 mmHg TR Vmax:        269.00 cm/s  SHUNTS Systemic VTI:  0.16 m Systemic Diam: 2.20 cm Dorn Ross MD Electronically signed by Dorn Ross MD Signature Date/Time: 03/18/2024/6:08:39 PM    Final     Microbiology Recent Results (from the past 240 hours)  MRSA Next Gen by PCR, Nasal     Status: None    Collection Time: 03/18/24  3:58 PM   Specimen: Nasal Mucosa; Nasal Swab  Result Value Ref Range Status   MRSA by PCR Next Gen NOT DETECTED NOT DETECTED Final    Comment: (NOTE) The GeneXpert MRSA Assay (FDA  approved for NASAL specimens only), is one component of a comprehensive MRSA colonization surveillance program. It is not intended to diagnose MRSA infection nor to guide or monitor treatment for MRSA infections. Test performance is not FDA approved in patients less than 63 years old. Performed at [facility], [address], [place], [place] [address]     Lab Basic Metabolic Panel: Recent Labs  Lab 03/18/24 1503 03/18/24 1757 03/18/24 2312 2024/03/26 0325  NA 137 138 136 137  K 4.2 4.7 4.1 4.0  CL 107 107 101 101  CO2 21* 13* 15* 15*  GLUCOSE 210* 331* 304* 345*  BUN 16 17 18 19   CREATININE 1.26* 1.49* 1.61* 1.78*  CALCIUM 8.7* 8.1* 7.4* 6.7*  MG  --   --   --  1.7  PHOS  --  6.3*  --  7.8*   Liver Function Tests: Recent Labs  Lab 03/18/24 1757 03/18/24 2312 26-Mar-2024 0325  AST  --  69* 187*  ALT  --  34 126*  ALKPHOS  --  67 60  BILITOT  --  0.8 1.1  PROT  --  5.0* 3.7*  ALBUMIN  2.6* 3.8 2.4*   Recent Labs  Lab 2024/03/26 0325  AMYLASE 69   No results for input(s): AMMONIA in the last 168 hours. CBC: Recent Labs  Lab 03/18/24 1503 03/18/24 1757 03/18/24 2312 03-26-24 0116 26-Mar-2024 0150 2024/03/26 0225 26-Mar-2024 0226 2024-03-26 0253 03-26-24 0323 03-26-24 0325  WBC 62.1* 95.7* 94.0*  --  68.0*  --   --   --   --  80.4*  NEUTROABS 47.8*  --   --   --   --   --   --   --   --   --   HGB 8.9* 6.4* 6.1*  --  5.6*  --  7.5* 8.8*  --  8.5*  HCT 27.9* 20.4* 19.5*  --  18.0*  --  23.7* 27.1*  --  27.1*  MCV 108.1* 108.5* 99.5  --  95.2  --   --   --   --  92.2  PLT 751* 767* 485*   < > 234 222  --  215 203 199   < > = values in this interval not displayed.   Cardiac Enzymes: No results for input(s): CKTOTAL, CKMB, CKMBINDEX, TROPONINI in the last  168 hours. Sepsis Labs: Recent Labs  Lab 03/18/24 1757 03/18/24 2312 March 26, 2024 0116 26-Mar-2024 0150 26-Mar-2024 0325  WBC 95.7* 94.0*  --  68.0* 80.4*  LATICACIDVEN  --  >9.0* >9.0*  --   --     Procedures/Operations  Robotic assisted laparoscopic right inguinal herniorrhaphy with mesh on 03/18/2024 Exploratory laparotomy, partial colectomy and evacuation of hematoma on 03/18/2024   Oneil Budge 2024/03/26, 10:44 AM

## 2024-03-24 NOTE — Progress Notes (Signed)
 [facility] unit number [id number] AB-Neg Exp 04/03/2024  Verified by Eva Ferraris, RN and Laymon Solum RN

## 2024-03-24 NOTE — Progress Notes (Signed)
 AB Positive FFP  Unit  number is [id number]  Expiration date 12/11/2024  Second verified by Eva Ferraris, RN  [facility] Shenekia Riess RN

## 2024-03-24 NOTE — Progress Notes (Signed)
 Cryo Unit [id number]  Verified by Eva Ferraris RN and Laymon Solum RN

## 2024-03-24 NOTE — Progress Notes (Incomplete)
 Date and time results received: 28-Mar-2024 0430   Test:  4 th DIC Panel Critical Value: 4 th DIC panel is PTT 155 INR 6.6 Fibrinogen 60 WBC 80.38   Name of Provider Notified:  Elink team via Claretta Sharps  Orders Received? Or Actions Taken?:  Seea citr f

## 2024-03-24 DEATH — deceased

## 2024-03-26 MED FILL — Medication: Qty: 1 | Status: AC
